# Patient Record
Sex: Female | Born: 1970 | Hispanic: No | Marital: Married | Smoking: Never smoker
Health system: Southern US, Community
[De-identification: ages and names within clinical notes are randomized; demographics above are authoritative.]

## PROBLEM LIST (undated history)

## (undated) DIAGNOSIS — I10 Essential (primary) hypertension: Secondary | ICD-10-CM

---

## 2020-07-16 ENCOUNTER — Encounter (HOSPITAL_COMMUNITY)
Admission: EM | Disposition: A | Discharge status: Rehabilitation Facility | Payer: Self-pay | Source: Home / Self Care | Attending: Neurology

## 2020-07-16 ENCOUNTER — Inpatient Hospital Stay (HOSPITAL_COMMUNITY): Payer: BC Managed Care – PPO | Admitting: Anesthesiology

## 2020-07-16 ENCOUNTER — Inpatient Hospital Stay (HOSPITAL_COMMUNITY): Payer: BC Managed Care – PPO

## 2020-07-16 ENCOUNTER — Emergency Department (HOSPITAL_COMMUNITY): Payer: BC Managed Care – PPO

## 2020-07-16 ENCOUNTER — Inpatient Hospital Stay (HOSPITAL_COMMUNITY)
Admission: EM | Admit: 2020-07-16 | Discharge: 2020-07-20 | DRG: 064 | Disposition: A | Discharge status: Rehabilitation Facility | Payer: BC Managed Care – PPO | Attending: Neurology | Admitting: Neurology

## 2020-07-16 DIAGNOSIS — Q211 Atrial septal defect: Secondary | ICD-10-CM | POA: Diagnosis not present

## 2020-07-16 DIAGNOSIS — J9690 Respiratory failure, unspecified, unspecified whether with hypoxia or hypercapnia: Secondary | ICD-10-CM | POA: Diagnosis not present

## 2020-07-16 DIAGNOSIS — I63512 Cerebral infarction due to unspecified occlusion or stenosis of left middle cerebral artery: Secondary | ICD-10-CM | POA: Diagnosis not present

## 2020-07-16 DIAGNOSIS — E785 Hyperlipidemia, unspecified: Secondary | ICD-10-CM | POA: Diagnosis present

## 2020-07-16 DIAGNOSIS — J9601 Acute respiratory failure with hypoxia: Secondary | ICD-10-CM | POA: Diagnosis not present

## 2020-07-16 DIAGNOSIS — I10 Essential (primary) hypertension: Secondary | ICD-10-CM | POA: Diagnosis present

## 2020-07-16 DIAGNOSIS — R29818 Other symptoms and signs involving the nervous system: Secondary | ICD-10-CM | POA: Diagnosis present

## 2020-07-16 DIAGNOSIS — Z20822 Contact with and (suspected) exposure to covid-19: Secondary | ICD-10-CM | POA: Diagnosis present

## 2020-07-16 DIAGNOSIS — E781 Pure hyperglyceridemia: Secondary | ICD-10-CM | POA: Diagnosis present

## 2020-07-16 DIAGNOSIS — I63522 Cerebral infarction due to unspecified occlusion or stenosis of left anterior cerebral artery: Secondary | ICD-10-CM | POA: Diagnosis present

## 2020-07-16 DIAGNOSIS — R4701 Aphasia: Secondary | ICD-10-CM | POA: Diagnosis present

## 2020-07-16 DIAGNOSIS — R339 Retention of urine, unspecified: Secondary | ICD-10-CM | POA: Diagnosis not present

## 2020-07-16 DIAGNOSIS — I63422 Cerebral infarction due to embolism of left anterior cerebral artery: Secondary | ICD-10-CM | POA: Diagnosis not present

## 2020-07-16 DIAGNOSIS — R29711 NIHSS score 11: Secondary | ICD-10-CM | POA: Diagnosis present

## 2020-07-16 DIAGNOSIS — I119 Hypertensive heart disease without heart failure: Secondary | ICD-10-CM | POA: Diagnosis present

## 2020-07-16 DIAGNOSIS — E872 Acidosis: Secondary | ICD-10-CM | POA: Diagnosis present

## 2020-07-16 DIAGNOSIS — E876 Hypokalemia: Secondary | ICD-10-CM | POA: Diagnosis not present

## 2020-07-16 DIAGNOSIS — I161 Hypertensive emergency: Secondary | ICD-10-CM | POA: Diagnosis present

## 2020-07-16 DIAGNOSIS — E7800 Pure hypercholesterolemia, unspecified: Secondary | ICD-10-CM | POA: Diagnosis present

## 2020-07-16 DIAGNOSIS — I609 Nontraumatic subarachnoid hemorrhage, unspecified: Secondary | ICD-10-CM

## 2020-07-16 DIAGNOSIS — E669 Obesity, unspecified: Secondary | ICD-10-CM | POA: Diagnosis present

## 2020-07-16 DIAGNOSIS — Z713 Dietary counseling and surveillance: Secondary | ICD-10-CM

## 2020-07-16 DIAGNOSIS — G8191 Hemiplegia, unspecified affecting right dominant side: Secondary | ICD-10-CM | POA: Diagnosis present

## 2020-07-16 DIAGNOSIS — R338 Other retention of urine: Secondary | ICD-10-CM | POA: Diagnosis not present

## 2020-07-16 DIAGNOSIS — Z6837 Body mass index (BMI) 37.0-37.9, adult: Secondary | ICD-10-CM | POA: Diagnosis not present

## 2020-07-16 DIAGNOSIS — R739 Hyperglycemia, unspecified: Secondary | ICD-10-CM

## 2020-07-16 DIAGNOSIS — I602 Nontraumatic subarachnoid hemorrhage from anterior communicating artery: Secondary | ICD-10-CM | POA: Diagnosis present

## 2020-07-16 DIAGNOSIS — R29810 Facial weakness: Secondary | ICD-10-CM | POA: Diagnosis present

## 2020-07-16 DIAGNOSIS — I639 Cerebral infarction, unspecified: Secondary | ICD-10-CM

## 2020-07-16 DIAGNOSIS — I670 Dissection of cerebral arteries, nonruptured: Secondary | ICD-10-CM | POA: Diagnosis present

## 2020-07-16 DIAGNOSIS — G2581 Restless legs syndrome: Secondary | ICD-10-CM | POA: Diagnosis present

## 2020-07-16 DIAGNOSIS — I350 Nonrheumatic aortic (valve) stenosis: Secondary | ICD-10-CM | POA: Diagnosis not present

## 2020-07-16 DIAGNOSIS — T17908A Unspecified foreign body in respiratory tract, part unspecified causing other injury, initial encounter: Secondary | ICD-10-CM

## 2020-07-16 DIAGNOSIS — K219 Gastro-esophageal reflux disease without esophagitis: Secondary | ICD-10-CM | POA: Diagnosis present

## 2020-07-16 DIAGNOSIS — Q2112 Patent foramen ovale: Secondary | ICD-10-CM

## 2020-07-16 DIAGNOSIS — E782 Mixed hyperlipidemia: Secondary | ICD-10-CM | POA: Diagnosis not present

## 2020-07-16 HISTORY — PX: RADIOLOGY WITH ANESTHESIA: SHX6223

## 2020-07-16 HISTORY — DX: Essential (primary) hypertension: I10

## 2020-07-16 LAB — CBC
HCT: 37.9 % (ref 36.0–46.0)
Hemoglobin: 12.1 g/dL (ref 12.0–15.0)
MCH: 25.4 pg — ABNORMAL LOW (ref 26.0–34.0)
MCHC: 31.9 g/dL (ref 30.0–36.0)
MCV: 79.6 fL — ABNORMAL LOW (ref 80.0–100.0)
Platelets: 407 10*3/uL — ABNORMAL HIGH (ref 150–400)
RBC: 4.76 MIL/uL (ref 3.87–5.11)
RDW: 19.8 % — ABNORMAL HIGH (ref 11.5–15.5)
WBC: 9.5 10*3/uL (ref 4.0–10.5)
nRBC: 0 % (ref 0.0–0.2)

## 2020-07-16 LAB — DIFFERENTIAL
Abs Immature Granulocytes: 0.02 10*3/uL (ref 0.00–0.07)
Basophils Absolute: 0.1 10*3/uL (ref 0.0–0.1)
Basophils Relative: 1 %
Eosinophils Absolute: 0.4 10*3/uL (ref 0.0–0.5)
Eosinophils Relative: 5 %
Immature Granulocytes: 0 %
Lymphocytes Relative: 40 %
Lymphs Abs: 3.8 10*3/uL (ref 0.7–4.0)
Monocytes Absolute: 0.7 10*3/uL (ref 0.1–1.0)
Monocytes Relative: 8 %
Neutro Abs: 4.4 10*3/uL (ref 1.7–7.7)
Neutrophils Relative %: 46 %

## 2020-07-16 LAB — I-STAT CHEM 8, ED
BUN: 12 mg/dL (ref 6–20)
Calcium, Ion: 1.11 mmol/L — ABNORMAL LOW (ref 1.15–1.40)
Chloride: 104 mmol/L (ref 98–111)
Creatinine, Ser: 0.8 mg/dL (ref 0.44–1.00)
Glucose, Bld: 99 mg/dL (ref 70–99)
HCT: 40 % (ref 36.0–46.0)
Hemoglobin: 13.6 g/dL (ref 12.0–15.0)
Potassium: 3.5 mmol/L (ref 3.5–5.1)
Sodium: 139 mmol/L (ref 135–145)
TCO2: 23 mmol/L (ref 22–32)

## 2020-07-16 LAB — CBG MONITORING, ED: Glucose-Capillary: 97 mg/dL (ref 70–99)

## 2020-07-16 LAB — I-STAT BETA HCG BLOOD, ED (MC, WL, AP ONLY): I-stat hCG, quantitative: <5 m[IU]/mL (ref <5)

## 2020-07-16 LAB — PROTIME-INR
INR: 0.9 (ref 0.8–1.2)
Prothrombin Time: 11.3 seconds — ABNORMAL LOW (ref 11.4–15.2)

## 2020-07-16 LAB — APTT: aPTT: 30 seconds (ref 24–36)

## 2020-07-16 LAB — SARS CORONAVIRUS 2 BY RT PCR (HOSPITAL ORDER, PERFORMED IN ~~LOC~~ HOSPITAL LAB): SARS Coronavirus 2: NEGATIVE

## 2020-07-16 SURGERY — IR WITH ANESTHESIA
Anesthesia: General

## 2020-07-16 MED ORDER — SODIUM CHLORIDE 0.9 % IV SOLN: INTRAVENOUS | Status: DC | PRN

## 2020-07-16 MED ORDER — FENTANYL CITRATE (PF) 100 MCG/2ML IJ SOLN
INTRAMUSCULAR | Status: DC | PRN
  Administered 2020-07-16 – 2020-07-17 (×2): 50 ug via INTRAVENOUS

## 2020-07-16 MED ORDER — CLEVIDIPINE BUTYRATE 0.5 MG/ML IV EMUL
INTRAVENOUS | Status: DC | PRN
  Administered 2020-07-16: 8 mg/h via INTRAVENOUS

## 2020-07-16 MED ORDER — CLEVIDIPINE BUTYRATE 0.5 MG/ML IV EMUL
INTRAVENOUS | Status: AC
  Administered 2020-07-16: 1 mg/h
  Filled 2020-07-16: qty 50

## 2020-07-16 MED ORDER — VERAPAMIL HCL 2.5 MG/ML IV SOLN
INTRAVENOUS | Status: AC
  Filled 2020-07-16: qty 2

## 2020-07-16 MED ORDER — SENNOSIDES-DOCUSATE SODIUM 8.6-50 MG PO TABS
1.0000 | ORAL_TABLET | Freq: Once | ORAL | Status: DC
  Filled 2020-07-16: qty 1

## 2020-07-16 MED ORDER — CLOPIDOGREL BISULFATE 300 MG PO TABS
ORAL_TABLET | ORAL | Status: AC
  Filled 2020-07-16: qty 1

## 2020-07-16 MED ORDER — SUCCINYLCHOLINE CHLORIDE 20 MG/ML IJ SOLN
INTRAMUSCULAR | Status: DC | PRN
  Administered 2020-07-16: 100 mg via INTRAVENOUS

## 2020-07-16 MED ORDER — SODIUM CHLORIDE 0.9% FLUSH
3.0000 mL | Freq: Once | INTRAVENOUS | Status: DC
Start: 2020-07-16 — End: 2020-07-20

## 2020-07-16 MED ORDER — ROCURONIUM BROMIDE 10 MG/ML (PF) SYRINGE
PREFILLED_SYRINGE | INTRAVENOUS | Status: DC | PRN
  Administered 2020-07-16: 30 mg via INTRAVENOUS
  Administered 2020-07-16: 20 mg via INTRAVENOUS

## 2020-07-16 MED ORDER — FENTANYL CITRATE (PF) 100 MCG/2ML IJ SOLN
INTRAMUSCULAR | Status: AC
  Filled 2020-07-16: qty 2

## 2020-07-16 MED ORDER — SODIUM CHLORIDE 0.9 % IV SOLN: INTRAVENOUS | Status: DC

## 2020-07-16 MED ORDER — STROKE: EARLY STAGES OF RECOVERY BOOK
Freq: Once | Status: DC
  Filled 2020-07-16: qty 1

## 2020-07-16 MED ORDER — ACETAMINOPHEN 650 MG RE SUPP
650.0000 mg | RECTAL | Status: DC | PRN
  Administered 2020-07-17: 650 mg via RECTAL
  Filled 2020-07-16: qty 1

## 2020-07-16 MED ORDER — ASPIRIN 81 MG PO CHEW
CHEWABLE_TABLET | ORAL | Status: AC
  Filled 2020-07-16: qty 1

## 2020-07-16 MED ORDER — CEFAZOLIN SODIUM-DEXTROSE 2-4 GM/100ML-% IV SOLN
INTRAVENOUS | Status: AC
  Filled 2020-07-16: qty 100

## 2020-07-16 MED ORDER — LIDOCAINE 2% (20 MG/ML) 5 ML SYRINGE
INTRAMUSCULAR | Status: DC | PRN
  Administered 2020-07-16: 60 mg via INTRAVENOUS

## 2020-07-16 MED ORDER — TIROFIBAN HCL IN NACL 5-0.9 MG/100ML-% IV SOLN
INTRAVENOUS | Status: AC
  Filled 2020-07-16: qty 100

## 2020-07-16 MED ORDER — ASPIRIN 300 MG RE SUPP
300.0000 mg | Freq: Every day | RECTAL | Status: DC
  Administered 2020-07-17: 300 mg via RECTAL
  Filled 2020-07-16: qty 1

## 2020-07-16 MED ORDER — PROPOFOL 10 MG/ML IV BOLUS
INTRAVENOUS | Status: DC | PRN
  Administered 2020-07-16: 200 mg via INTRAVENOUS

## 2020-07-16 MED ORDER — ACETAMINOPHEN 160 MG/5ML PO SOLN: 650.0000 mg | ORAL | Status: DC | PRN

## 2020-07-16 MED ORDER — ACETAMINOPHEN 325 MG PO TABS: 650.0000 mg | ORAL_TABLET | ORAL | Status: DC | PRN

## 2020-07-16 MED ORDER — IOHEXOL 350 MG/ML SOLN
75.0000 mL | Freq: Once | INTRAVENOUS | Status: AC | PRN
  Administered 2020-07-16: 75 mL via INTRAVENOUS

## 2020-07-16 MED ORDER — TICAGRELOR 90 MG PO TABS
ORAL_TABLET | ORAL | Status: AC
  Filled 2020-07-16: qty 2

## 2020-07-16 MED ORDER — EPTIFIBATIDE 20 MG/10ML IV SOLN
INTRAVENOUS | Status: AC
  Filled 2020-07-16: qty 10

## 2020-07-16 MED ORDER — ASPIRIN 325 MG PO TABS
325.0000 mg | ORAL_TABLET | Freq: Every day | ORAL | Status: DC
  Administered 2020-07-18: 325 mg via ORAL
  Filled 2020-07-16 (×2): qty 1

## 2020-07-16 MED ORDER — IOHEXOL 240 MG/ML SOLN
INTRAMUSCULAR | Status: AC
  Filled 2020-07-16: qty 200

## 2020-07-16 MED ORDER — NITROGLYCERIN 1 MG/10 ML FOR IR/CATH LAB
INTRA_ARTERIAL | Status: AC
  Filled 2020-07-16: qty 10

## 2020-07-16 NOTE — Code Documentation (Signed)
Responded to Code Stroke called at 2143 for R sided numbness/weakness, aphasia, and R facial droop, [id number]. Pt arrived at 2200, NIH-11, CBG-99, CT head-focal [facility]. CTA-L A2 segment occlusion, ?L frontal cortical vein thrombosis. Pt started on cleviprex gtt-titration orders for SBP<180.  Decision made to take pt to IR for cerebral arteriogram. IR paged out at 2253. Pt prepped and transported to IR [facility] at 2258

## 2020-07-16 NOTE — ED Notes (Signed)
Report given to Anastisia.  Change of monitoring equipment.  Belongings given to daughter.

## 2020-07-16 NOTE — Anesthesia Preprocedure Evaluation (Addendum)
Anesthesia Evaluation  Patient identified by MRN, date of birth, ID band  Reviewed: Allergy & Precautions, Patient's Chart, lab work & pertinent test results, Unable to perform ROS - Chart review onlyPreop documentation limited or incomplete due to emergent nature of procedure.  Airway Mallampati: II       Dental  (+) Teeth Intact   Pulmonary    Pulmonary exam normal        Cardiovascular Normal cardiovascular exam     Neuro/Psych CVA, Residual Symptoms    GI/Hepatic   Endo/Other    Renal/GU      Musculoskeletal   Abdominal   Peds  Hematology   Anesthesia Other Findings Code Stroke  Reproductive/Obstetrics                            Anesthesia Physical Anesthesia Plan  ASA: III and emergent  Anesthesia Plan: General   Post-op Pain Management:    Induction: Intravenous and Rapid sequence  PONV Risk Score and Plan: 3 and Ondansetron, Dexamethasone and Treatment may vary due to age or medical condition  Airway Management Planned: Oral ETT  Additional Equipment: Arterial line  Intra-op Plan:   Post-operative Plan: Extubation in OR  Informed Consent:     Only emergency history available, History available from chart only and Consent reviewed with POA  Plan Discussed with: CRNA  Anesthesia Plan Comments: (Anesthetic plan discussed with family)        Anesthesia Quick Evaluation

## 2020-07-16 NOTE — ED Notes (Signed)
2315 anesthesia ready - timeout completed 2316 induction began 2317 intubation  2320 in IR suite.

## 2020-07-16 NOTE — Anesthesia Procedure Notes (Signed)
Procedure Name: Intubation Date/Time: 07/16/2020 11:17 PM Performed by: Edmonia Caprio, CRNA Pre-anesthesia Checklist: Patient identified, Emergency Drugs available, Suction available, Timeout performed and Patient being monitored Patient Re-evaluated:Patient Re-evaluated prior to induction Oxygen Delivery Method: Ambu bag Preoxygenation: Pre-oxygenation with 100% oxygen Induction Type: IV induction and Rapid sequence Laryngoscope Size: Glidescope and 3 Grade View: Grade I Tube type: Oral Tube size: 7.0 mm Number of attempts: 1 Airway Equipment and Method: Video-laryngoscopy and Stylet Placement Confirmation: ETT inserted through vocal cords under direct vision,  positive ETCO2 and breath sounds checked- equal and bilateral Secured at: 22 cm Tube secured with: Tape Dental Injury: Teeth and Oropharynx as per pre-operative assessment  Comments: Glidescope intubation in IR intubation room due to unknown covid status

## 2020-07-16 NOTE — ED Notes (Signed)
Family bedside, has been updated continuously.  Daughter holding pt's hand.

## 2020-07-16 NOTE — H&P (Signed)
Neurology H&P  CC: Aphasia and leg weakness  History is obtained from: Daughter  HPI: Tanya Lopez is a 49 y.o. female with a history of hypertension who presents with weakness and aphasia that started abruptly at 21:07.  She was in the kitchen covering a dish, when she called to her daughter said that her right leg felt like it was going numb.  She subsequently developed trouble speaking.  A code stroke was activated en route and she was taken for an emergent head CT.  Unfortunately, she was not a TPA candidate due to a small cortical subarachnoid that appears acute.  A CTA was performed which demonstrates a left A2 occlusion.  Given that there is no other treatment option, this case was discussed with Dr. Corliss Skains of neuro interventional radiology.  There is a possibility that this vessel is large enough to be amenable to some type of mechanical revascularization technique and therefore after discussion with the husband(Tanya Lopez, local internist), the decision was made to proceed with attempting this.  Her daughter denies any recent head trauma.  LKW: 21:07 pm  tpa given?: No, SAH IR Thrombectomy? Yes Modified Rankin Scale: 0-Completely asymptomatic and back to baseline post- stroke  ROS: A complete ROS was performed and is negative except as noted in the HPI.   PMH:  Hypertension  FHx: Unable to assess secondary to patient's altered mental status.   Social History: Does not smoke   Prior to Admission medications   Not on File  Bystolic Amlodipine   Exam: Current vital signs: BP (!) 172/81   Pulse 99   Resp 22   Wt (!) 95.3 kg   SpO2 98%    Physical Exam  Constitutional: Appears well-developed and well-nourished.  Psych: Affect appropriate to situation Eyes: No scleral injection HENT: No OP obstrucion Head: Normocephalic.  Cardiovascular: Normal rate and regular rhythm.  Respiratory: Effort normal and breath sounds normal to anterior  ascultation GI: Soft.  No distension. There is no tenderness.  Skin: WDI  Neuro: Mental Status: Patient is awake, alert, she has a dense expressive aphasia.  She is able to follow commands, some readily, but has more difficulty with more complex commands. Cranial Nerves: II: She blinks to threat bilaterally pupils are equal, round, and reactive to light.   III,IV, VI: EOMI without ptosis or diploplia.  V: Facial sensation is symmetric to temperature VII: Facial movement is symmetric.  VIII: hearing is intact to voice X: Uvula elevates symmetrically XI: Shoulder shrug is symmetric. XII: tongue is midline without atrophy or fasciculations.  Motor: Tone is normal. Bulk is normal. 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   Secondary Diagnosis: Essential (primary) hypertension Subarachnoid hemorrhage ?  Cortical vein thrombosis  Impression: 49 year old female with left A2 occlusion.  She also has a tiny cortical subarachnoid hemorrhage of unclear etiology, but there is question of a cortical vein thrombosis on CTA though I am not certain this is definite.  After discussion with neuro interventional radiology, the  decision to proceed with cerebral arteriogram and mechanical thrombectomy was pursued.   The angiogram will also likely be able to shed some light on whether there is truly a cortical vein thrombosis responsible for her tiny cortical  subarachnoid.  Plan: - HgbA1c, fasting lipid panel - MRI of the brain without contrast - Frequent neuro checks - Echocardiogram - Carotid dopplers - Prophylactic therapy-pending outcome of IR procedure - Risk factor modification - Telemetry monitoring - PT consult, OT consult, Speech consult - Stroke team to follow  This patient is critically ill and at significant risk of neurological worsening, death and care requires constant monitoring of vital signs, hemodynamics,respiratory and cardiac monitoring, neurological assessment, discussion with family, other specialists and medical decision making of high complexity. I spent 60 minutes of neurocritical care time  in the care of  this patient. This was time spent independent of any time provided by nurse practitioner or PA.  Ritta Slot, MD [facility] [phone]  If 7pm- 7am, please page neurology on call as listed in AMION. 07/16/2020  11:55 PM

## 2020-07-16 NOTE — ED Provider Notes (Signed)
[facility] EMERGENCY DEPARTMENT Provider Note   CSN: [id number] Arrival date & time: 07/16/20  2200  An emergency department physician performed an initial assessment on this suspected stroke patient at 2200.  History Chief Complaint  Patient presents with  . Code Stroke    Tanya Lopez is a 49 y.o. female.  Pt presents to the ED today with an acute onset of right sided weakness and inability to speak.  She was LSN 2107.  She was speaking on the phone when this occurred.  This was witnessed by her husband.  The pt is unable to give any hx.        No past medical history on file.  Patient Active Problem List   Diagnosis Date Noted  . 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     No family history on file.  Social History   Tobacco Use  . Smoking status: Not on file  Substance Use Topics  . Alcohol use: Not on file  . Drug use: Not on file    Home Medications Prior to Admission medications   Not on File    Allergies    Patient has no known allergies.  Review of Systems   Review of Systems  Neurological: Positive for weakness.    Physical Exam Updated Vital Signs BP (!) 172/81   Pulse 99   Resp 22   Wt (!) 95.3 kg   SpO2 98%   Physical Exam Vitals and nursing note reviewed.  Constitutional:      Appearance: Normal appearance.  HENT:     Head: Normocephalic and atraumatic.     Right Ear: External ear normal.     Left Ear: External ear normal.     Nose: Nose normal.     Mouth/Throat:     Mouth: Mucous membranes are moist.     Pharynx: Oropharynx is clear.  Eyes:     Extraocular Movements: Extraocular movements intact.     Conjunctiva/sclera: Conjunctivae normal.     Pupils: Pupils are equal, round, and reactive to light.  Cardiovascular:     Rate and Rhythm: Normal rate and regular rhythm.     Pulses: Normal pulses.     Heart sounds: Normal heart sounds.    Pulmonary:     Effort: Pulmonary effort is normal.     Breath sounds: Normal breath sounds.  Abdominal:     General: Abdomen is flat. Bowel sounds are normal.     Palpations: Abdomen is soft.  Musculoskeletal:        General: Normal range of motion.     Cervical back: Normal range of motion and neck supple.  Skin:    General: Skin is warm.     Capillary Refill: Capillary refill takes less than 2 seconds.  Neurological:     Mental Status: She is alert.     Comments: Right arm and leg weakness.  Aphasia.  Psychiatric:        Mood and Affect: Mood normal.        Behavior: Behavior normal.        Thought Content: Thought content normal.        Judgment: Judgment normal.     ED Results / Procedures / Treatments   Labs (all labs ordered are listed, but only abnormal results are displayed) Labs Reviewed  PROTIME-INR - Abnormal; Notable for the following components:      Result  Value   Prothrombin Time 11.3 (*)    All other components within normal limits  CBC - Abnormal; Notable for the following components:   MCV 79.6 (*)    MCH 25.4 (*)    RDW 19.8 (*)    Platelets 407 (*)    All other components within normal limits  I-STAT CHEM 8, ED - Abnormal; Notable for the following components:   Calcium, Ion 1.11 (*)    All other components within normal limits  SARS CORONAVIRUS 2 BY RT PCR (HOSPITAL ORDER, PERFORMED IN [facility])  APTT  DIFFERENTIAL  COMPREHENSIVE METABOLIC PANEL  HIV ANTIBODY (ROUTINE TESTING W REFLEX)  HEMOGLOBIN A1C  LIPID PANEL  CBG MONITORING, ED  I-STAT BETA HCG BLOOD, ED ([facility], [facility], [facility] ONLY)    EKG None  Radiology CT Code Stroke CTA Head W/WO contrast  Result Date: 07/16/2020 CLINICAL DATA:  Right-sided facial droop. Right-sided numbness. Posteromedial frontal lobe subarachnoid hemorrhage. EXAM: CT ANGIOGRAPHY HEAD AND NECK TECHNIQUE: Multidetector CT imaging of the head and neck was performed using the standard protocol during bolus  administration of intravenous contrast. Multiplanar CT image reconstructions and MIPs were obtained to evaluate the vascular anatomy. Carotid stenosis measurements (when applicable) are obtained utilizing NASCET criteria, using the distal internal carotid diameter as the denominator. CONTRAST:  16mL OMNIPAQUE IOHEXOL 350 MG/ML SOLN COMPARISON:  CT head without contrast 07/16/2020 FINDINGS: CTA NECK FINDINGS Aortic arch: A 3 vessel arch configuration is present. No significant atherosclerotic disease is present. No stenosis or aneurysm is present. Right carotid system: The right common carotid artery is mildly tortuous. No significant stenosis is present. The bifurcation is unremarkable. Mild tortuosity is present in the proximal cervical right ICA without significant stenosis. Left carotid system: The left common carotid artery is within normal limits. The bifurcation is unremarkable. Mild tortuosity is present in the cervical left ICA without significant stenosis. Vertebral arteries: The left vertebral artery is the dominant vessel. Both vertebral arteries originate from the subclavian arteries without significant stenosis. No significant stenosis are present in either vertebral artery in the neck. Skeleton: Vertebral body heights and alignment are normal. Straightening of the normal cervical lordosis is noted. No focal lytic or blastic lesions are present. Other neck: The soft tissues the neck are otherwise unremarkable. Salivary glands are within normal limits. Thyroid is normal. No significant adenopathy is present. Upper chest: The lung apices demonstrate mild dependent atelectasis. No focal nodule, mass, or airspace disease is present. The thoracic inlet is within normal limits. Review of the MIP images confirms the above findings CTA HEAD FINDINGS Anterior circulation: The internal carotid arteries are within normal limits through the ICA termini bilaterally. The A1 and M1 segments are normal. Left A2 segment  occlusion is present with no reconstitution of distal branch vessels on the left. Right ACA branch vessels are within normal limits. MCA branch vessels are normal bilaterally. Posterior circulation: The left vertebral artery is the dominant vessel. PICA origins are visualized and normal. The vertebrobasilar junction is normal. Mild narrowing is present in the distal basilar artery. Fetal type right posterior cerebral artery is present. Both posterior cerebral arteries originate from the basilar tip. Prominent posterior communicating arteries are present bilaterally. PCA branch vessels are within normal limits. Venous sinuses: The dural sinuses are patent. The straight sinus deep cerebral veins are intact. Cortical vein near the area subarachnoid hemorrhage appears incomplete to the sagittal sinus. Cortical veins are otherwise within normal limits. Anatomic variants: Prominent posterior communicating arteries bilaterally.  Review of the MIP images confirms the above findings IMPRESSION: 1. 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 2. Incomplete filling of posterior left frontal cortical vein could represent cortical vein thrombosis. Cerebral arteriogram would be useful for further evaluation of both possibilities. 3. Mild tortuosity of cervical vessels without significant cervical stenosis. This is most commonly seen in the setting of chronic hypertension. These results were called by telephone at the time of interpretation on 07/16/2020 at 10:42 pm to provider [facility] , who verbally acknowledged these results. Electronically Signed   By: Marin Robertshristopher  Mattern M.D.   On: 07/16/2020 22:43   CT Code Stroke CTA Neck W/WO contrast  Result Date: 07/16/2020 CLINICAL DATA:  Right-sided facial droop. Right-sided numbness. Posteromedial frontal lobe subarachnoid hemorrhage. EXAM: CT ANGIOGRAPHY HEAD AND NECK TECHNIQUE: Multidetector CT imaging of the head and neck was performed  using the standard protocol during bolus administration of intravenous contrast. Multiplanar CT image reconstructions and MIPs were obtained to evaluate the vascular anatomy. Carotid stenosis measurements (when applicable) are obtained utilizing NASCET criteria, using the distal internal carotid diameter as the denominator. CONTRAST:  75mL OMNIPAQUE IOHEXOL 350 MG/ML SOLN COMPARISON:  CT head without contrast 07/16/2020 FINDINGS: CTA NECK FINDINGS Aortic arch: A 3 vessel arch configuration is present. No significant atherosclerotic disease is present. No stenosis or aneurysm is present. Right carotid system: The right common carotid artery is mildly tortuous. No significant stenosis is present. The bifurcation is unremarkable. Mild tortuosity is present in the proximal cervical right ICA without significant stenosis. Left carotid system: The left common carotid artery is within normal limits. The bifurcation is unremarkable. Mild tortuosity is present in the cervical left ICA without significant stenosis. Vertebral arteries: The left vertebral artery is the dominant vessel. Both vertebral arteries originate from the subclavian arteries without significant stenosis. No significant stenosis are present in either vertebral artery in the neck. Skeleton: Vertebral body heights and alignment are normal. Straightening of the normal cervical lordosis is noted. No focal lytic or blastic lesions are present. Other neck: The soft tissues the neck are otherwise unremarkable. Salivary glands are within normal limits. Thyroid is normal. No significant adenopathy is present. Upper chest: The lung apices demonstrate mild dependent atelectasis. No focal nodule, mass, or airspace disease is present. The thoracic inlet is within normal limits. Review of the MIP images confirms the above findings CTA HEAD FINDINGS Anterior circulation: The internal carotid arteries are within normal limits through the ICA termini bilaterally. The A1  and M1 segments are normal. Left A2 segment occlusion is present with no reconstitution of distal branch vessels on the left. Right ACA branch vessels are within normal limits. MCA branch vessels are normal bilaterally. Posterior circulation: The left vertebral artery is the dominant vessel. PICA origins are visualized and normal. The vertebrobasilar junction is normal. Mild narrowing is present in the distal basilar artery. Fetal type right posterior cerebral artery is present. Both posterior cerebral arteries originate from the basilar tip. Prominent posterior communicating arteries are present bilaterally. PCA branch vessels are within normal limits. Venous sinuses: The dural sinuses are patent. The straight sinus deep cerebral veins are intact. Cortical vein near the area subarachnoid hemorrhage appears incomplete to the sagittal sinus. Cortical veins are otherwise within normal limits. Anatomic variants: Prominent posterior communicating arteries bilaterally. Review of the MIP images confirms the above findings IMPRESSION: 1. 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 2. Incomplete filling of posterior left frontal cortical vein  could represent cortical vein thrombosis. Cerebral arteriogram would be useful for further evaluation of both possibilities. 3. Mild tortuosity of cervical vessels without significant cervical stenosis. This is most commonly seen in the setting of chronic hypertension. These results were called by telephone at the time of interpretation on 07/16/2020 at 10:42 pm to provider MCNEILL [facility] , who verbally acknowledged these results. Electronically Signed   By: Marin Roberts M.D.   On: 07/16/2020 22:43   CT HEAD CODE STROKE WO CONTRAST  Result Date: 07/16/2020 CLINICAL DATA:  Code stroke. Right-sided facial droop. Right-sided numbness. EXAM: CT HEAD WITHOUT CONTRAST TECHNIQUE: Contiguous axial images were obtained from the base of the skull  through the vertex without intravenous contrast. COMPARISON:  None. FINDINGS: Brain: Focal subarachnoid hemorrhage is present in the medial posterior left frontal lobe. No other areas of hemorrhage are present. No focal cortical abnormality is present. Cortex adjacent to the subarachnoid hemorrhage is obscured. Insular ribbon is normal. Basal ganglia are normal. The brainstem and cerebellum are within normal limits. Vascular: No hyperdense vessel or unexpected calcification. Skull: Calvarium is intact. No focal lytic or blastic lesions are present. No significant extracranial soft tissue lesion is present. Sinuses/Orbits: A polyp or mucous retention cyst is present along the floor of the left maxillary sinus posteriorly. The paranasal sinuses and mastoid air cells are otherwise clear. The globes and orbits are within normal limits. ASPECTS [facility] Stroke Program Early CT Score) - Ganglionic level infarction (caudate, lentiform nuclei, internal capsule, insula, M1-M3 cortex): 7/7 - Supraganglionic infarction (M4-M6 cortex): 3/3 Total score (0-10 with 10 being normal): 10/10 IMPRESSION: 1. Focal subarachnoid hemorrhage in the medial posterior left frontal lobe. Question venous hemorrhage. Vascular malformation considered less likely. Alternatively, could represent hemorrhagic conversion of an occult infarct. 2. Otherwise normal CT appearance of the brain. 3. ASPECTS is 10/10 The above was relayed via text pager to Dr. Amada Jupiter on 07/16/2020 at 22:12 . Electronically Signed   By: Marin Roberts M.D.   On: 07/16/2020 22:17    Procedures Procedures (including critical care time)  Medications Ordered in ED Medications  sodium chloride flush (NS) 0.9 % injection 3 mL (has no administration in time range)   stroke: mapping our early stages of recovery book (has no administration in time range)  0.9 %  sodium chloride infusion (has no administration in time range)  acetaminophen (TYLENOL) tablet 650 mg  (has no administration in time range)    Or  acetaminophen (TYLENOL) 160 MG/5ML solution 650 mg (has no administration in time range)    Or  acetaminophen (TYLENOL) suppository 650 mg (has no administration in time range)  senna-docusate (Senokot-S) tablet 1 tablet (has no administration in time range)  aspirin suppository 300 mg (has no administration in time range)    Or  aspirin tablet 325 mg (has no administration in time range)  iohexol (OMNIPAQUE) 240 MG/ML injection (has no administration in time range)  aspirin 81 MG chewable tablet (has no administration in time range)  verapamil (ISOPTIN) 2.5 MG/ML injection (has no administration in time range)  ticagrelor (BRILINTA) 90 MG tablet (has no administration in time range)  clopidogrel (PLAVIX) 300 MG tablet (has no administration in time range)  nitroGLYCERIN 100 mcg/mL intra-arterial injection (has no administration in time range)  tirofiban (AGGRASTAT) 5-0.9 MG/100ML-% injection (has no administration in time range)  eptifibatide (INTEGRILIN) 20 MG/10ML injection (has no administration in time range)  ceFAZolin (ANCEF) 2-4 GM/100ML-% IVPB (has no administration in time range)  fentaNYL (SUBLIMAZE)  100 MCG/2ML injection (has no administration in time range)  iohexol (OMNIPAQUE) 350 MG/ML injection 75 mL (75 mLs Intravenous Contrast Given 07/16/20 2220)  clevidipine (CLEVIPREX) 0.5 MG/ML infusion (8 mg/hr  Rate/Dose Change 07/16/20 2302)    ED Course  I have reviewed the triage vital signs and the nursing notes.  Pertinent labs & imaging results that were available during my care of the patient were reviewed by me and considered in my medical decision making (see chart for details).    MDM Rules/Calculators/A&P                         Pt taken straight to CT.  She has a SAH, so is unable to get tpa.  CT showed an A2 segment occlusion.  Pt taken directly to IR.   CRITICAL CARE Performed by: Jacalyn Lefevre   Total critical  care time: 30 minutes  Critical care time was exclusive of separately billable procedures and treating other patients.  Critical care was necessary to treat or prevent imminent or life-threatening deterioration.  Critical care was time spent personally by me on the following activities: development of treatment plan with patient and/or surrogate as well as nursing, discussions with consultants, evaluation of patient's response to treatment, examination of patient, obtaining history from patient or surrogate, ordering and performing treatments and interventions, ordering and review of laboratory studies, ordering and review of radiographic studies, pulse oximetry and re-evaluation of patient's condition. Final Clinical Impression(s) / ED Diagnoses Final diagnoses:  Stroke [facility])    Rx / DC Orders ED Discharge Orders    None       Jacalyn Lefevre, MD 07/16/20 2338

## 2020-07-16 NOTE — ED Notes (Signed)
Just arrived to IR w/ PT and family.

## 2020-07-16 NOTE — ED Triage Notes (Signed)
Per EMS pt from home, at began having right sided leg numbness, fell to the ground and could not get up.  No resistance on right side leg or arm, unable to speak.

## 2020-07-17 ENCOUNTER — Inpatient Hospital Stay (HOSPITAL_COMMUNITY): Payer: BC Managed Care – PPO

## 2020-07-17 DIAGNOSIS — I63522 Cerebral infarction due to unspecified occlusion or stenosis of left anterior cerebral artery: Secondary | ICD-10-CM | POA: Diagnosis present

## 2020-07-17 DIAGNOSIS — E7800 Pure hypercholesterolemia, unspecified: Secondary | ICD-10-CM

## 2020-07-17 DIAGNOSIS — I161 Hypertensive emergency: Secondary | ICD-10-CM

## 2020-07-17 DIAGNOSIS — I350 Nonrheumatic aortic (valve) stenosis: Secondary | ICD-10-CM

## 2020-07-17 DIAGNOSIS — J9601 Acute respiratory failure with hypoxia: Secondary | ICD-10-CM

## 2020-07-17 DIAGNOSIS — R29818 Other symptoms and signs involving the nervous system: Secondary | ICD-10-CM | POA: Diagnosis present

## 2020-07-17 HISTORY — PX: IR ANGIO VERTEBRAL SEL VERTEBRAL BILAT MOD SED: IMG5369

## 2020-07-17 HISTORY — PX: IR ANGIO INTRA EXTRACRAN SEL COM CAROTID INNOMINATE BILAT MOD SED: IMG5360

## 2020-07-17 HISTORY — PX: IR CT HEAD LTD: IMG2386

## 2020-07-17 LAB — LIPID PANEL
Cholesterol: 302 mg/dL — ABNORMAL HIGH (ref 0–200)
Triglycerides: 1600 mg/dL — ABNORMAL HIGH (ref <150)
VLDL: UNDETERMINED mg/dL (ref 0–40)

## 2020-07-17 LAB — COMPREHENSIVE METABOLIC PANEL
ALT: 22 U/L (ref 0–44)
AST: 34 U/L (ref 15–41)
Albumin: 3.7 g/dL (ref 3.5–5.0)
Alkaline Phosphatase: 66 U/L (ref 38–126)
Anion gap: 18 — ABNORMAL HIGH (ref 5–15)
BUN: 11 mg/dL (ref 6–20)
CO2: 16 mmol/L — ABNORMAL LOW (ref 22–32)
Calcium: 9.1 mg/dL (ref 8.9–10.3)
Chloride: 103 mmol/L (ref 98–111)
Creatinine, Ser: 0.83 mg/dL (ref 0.44–1.00)
GFR calc Af Amer: >60 mL/min (ref >60)
GFR calc non Af Amer: >60 mL/min (ref >60)
Glucose, Bld: 97 mg/dL (ref 70–99)
Potassium: 3.9 mmol/L (ref 3.5–5.1)
Sodium: 137 mmol/L (ref 135–145)
Total Bilirubin: 0.8 mg/dL (ref 0.3–1.2)
Total Protein: 7.7 g/dL (ref 6.5–8.1)

## 2020-07-17 LAB — ECHOCARDIOGRAM COMPLETE
AR max vel: 1.96 cm2
AV Area VTI: 1.85 cm2
AV Area mean vel: 2.01 cm2
AV Mean grad: 11 mmHg
AV Peak grad: 18.5 mmHg
Ao pk vel: 2.15 m/s
Area-P 1/2: 2.99 cm2
Height: 63 in
S' Lateral: 2.1 cm
Weight: 3361.57 oz

## 2020-07-17 LAB — POCT I-STAT 7, (LYTES, BLD GAS, ICA,H+H)
Acid-base deficit: 3 mmol/L — ABNORMAL HIGH (ref 0.0–2.0)
Bicarbonate: 21.2 mmol/L (ref 20.0–28.0)
Calcium, Ion: 1.11 mmol/L — ABNORMAL LOW (ref 1.15–1.40)
HCT: 38 % (ref 36.0–46.0)
Hemoglobin: 12.9 g/dL (ref 12.0–15.0)
O2 Saturation: 97 %
Patient temperature: 98.2
Potassium: 3.6 mmol/L (ref 3.5–5.1)
Sodium: 136 mmol/L (ref 135–145)
TCO2: 22 mmol/L (ref 22–32)
pCO2 arterial: 35.4 mmHg (ref 32.0–48.0)
pH, Arterial: 7.384 (ref 7.350–7.450)
pO2, Arterial: 91 mmHg (ref 83.0–108.0)

## 2020-07-17 LAB — MRSA PCR SCREENING: MRSA by PCR: POSITIVE — AB

## 2020-07-17 LAB — URINALYSIS, ROUTINE W REFLEX MICROSCOPIC
Bilirubin Urine: NEGATIVE
Glucose, UA: NEGATIVE mg/dL
Ketones, ur: NEGATIVE mg/dL
Nitrite: NEGATIVE
Protein, ur: 30 mg/dL — AB
Specific Gravity, Urine: 1.032 — ABNORMAL HIGH (ref 1.005–1.030)
pH: 7 (ref 5.0–8.0)

## 2020-07-17 LAB — LACTIC ACID, PLASMA
Lactic Acid, Venous: 2.5 mmol/L (ref 0.5–1.9)
Lactic Acid, Venous: 1.7 mmol/L (ref 0.5–1.9)
Lactic Acid, Venous: 2.5 mmol/L (ref 0.5–1.9)

## 2020-07-17 LAB — TRIGLYCERIDES: Triglycerides: 1549 mg/dL — ABNORMAL HIGH (ref <150)

## 2020-07-17 MED ORDER — ONDANSETRON HCL 4 MG/2ML IJ SOLN
4.0000 mg | Freq: Four times a day (QID) | INTRAMUSCULAR | Status: DC | PRN
  Administered 2020-07-17: 4 mg via INTRAVENOUS
  Filled 2020-07-17: qty 2

## 2020-07-17 MED ORDER — IOHEXOL 300 MG/ML  SOLN: 150.0000 mL | Freq: Once | INTRAMUSCULAR | Status: DC | PRN

## 2020-07-17 MED ORDER — NICARDIPINE HCL IN NACL 20-0.86 MG/200ML-% IV SOLN
3.0000 mg/h | INTRAVENOUS | Status: DC
  Administered 2020-07-17: 5 mg/h via INTRAVENOUS
  Administered 2020-07-17: 7.5 mg/h via INTRAVENOUS
  Administered 2020-07-17: 5 mg/h via INTRAVENOUS
  Administered 2020-07-18: 3 mg/h via INTRAVENOUS
  Filled 2020-07-17 (×6): qty 200

## 2020-07-17 MED ORDER — PANTOPRAZOLE SODIUM 40 MG IV SOLR
40.0000 mg | INTRAVENOUS | Status: DC
  Administered 2020-07-17 – 2020-07-18 (×2): 40 mg via INTRAVENOUS
  Filled 2020-07-17 (×2): qty 40

## 2020-07-17 MED ORDER — MUPIROCIN 2 % EX OINT
1.0000 "application " | TOPICAL_OINTMENT | Freq: Two times a day (BID) | CUTANEOUS | Status: DC
  Administered 2020-07-17 – 2020-07-20 (×7): 1 via NASAL
  Filled 2020-07-17 (×3): qty 22

## 2020-07-17 MED ORDER — CHLORHEXIDINE GLUCONATE CLOTH 2 % EX PADS
6.0000 | MEDICATED_PAD | Freq: Every day | CUTANEOUS | Status: DC
  Administered 2020-07-18 – 2020-07-19 (×3): 6 via TOPICAL

## 2020-07-17 MED ORDER — CLEVIDIPINE BUTYRATE 0.5 MG/ML IV EMUL
0.0000 mg/h | INTRAVENOUS | Status: DC
  Administered 2020-07-17: 8 mg/h via INTRAVENOUS
  Administered 2020-07-17: 4 mg/h via INTRAVENOUS
  Filled 2020-07-17: qty 100
  Filled 2020-07-17: qty 50
  Filled 2020-07-17: qty 100

## 2020-07-17 MED ORDER — SODIUM CHLORIDE 0.9 % IV SOLN: INTRAVENOUS | Status: DC

## 2020-07-17 MED ORDER — PERFLUTREN LIPID MICROSPHERE
1.0000 mL | INTRAVENOUS | Status: AC | PRN
  Filled 2020-07-17: qty 10

## 2020-07-17 MED ORDER — ONDANSETRON HCL 4 MG/2ML IJ SOLN
INTRAMUSCULAR | Status: DC | PRN
  Administered 2020-07-17: 4 mg via INTRAVENOUS

## 2020-07-17 MED ORDER — CLEVIDIPINE BUTYRATE 0.5 MG/ML IV EMUL
INTRAVENOUS | Status: AC
  Filled 2020-07-17: qty 50

## 2020-07-17 MED ORDER — FENTANYL CITRATE (PF) 100 MCG/2ML IJ SOLN: 50.0000 ug | INTRAMUSCULAR | Status: DC | PRN

## 2020-07-17 MED ORDER — CHLORHEXIDINE GLUCONATE 0.12% ORAL RINSE (MEDLINE KIT)
15.0000 mL | Freq: Two times a day (BID) | OROMUCOSAL | Status: DC
  Administered 2020-07-17 (×2): 15 mL via OROMUCOSAL

## 2020-07-17 MED ORDER — FENTANYL CITRATE (PF) 100 MCG/2ML IJ SOLN
50.0000 ug | INTRAMUSCULAR | Status: DC | PRN
  Administered 2020-07-17: 50 ug via INTRAVENOUS
  Filled 2020-07-17: qty 2

## 2020-07-17 MED ORDER — PROPOFOL 1000 MG/100ML IV EMUL
5.0000 ug/kg/min | INTRAVENOUS | Status: DC
  Administered 2020-07-17: 35 ug/kg/min via INTRAVENOUS
  Administered 2020-07-17: 30 ug/kg/min via INTRAVENOUS
  Filled 2020-07-17 (×2): qty 100

## 2020-07-17 MED ORDER — PROPOFOL 500 MG/50ML IV EMUL
INTRAVENOUS | Status: DC | PRN
Start: 2020-07-17 — End: 2020-07-17
  Administered 2020-07-17: 25 ug/kg/min via INTRAVENOUS

## 2020-07-17 MED ORDER — CEFAZOLIN SODIUM-DEXTROSE 2-3 GM-%(50ML) IV SOLR
INTRAVENOUS | Status: DC | PRN
  Administered 2020-07-16: 2 g via INTRAVENOUS

## 2020-07-17 MED ORDER — HEPARIN SODIUM (PORCINE) 5000 UNIT/ML IJ SOLN
5000.0000 [IU] | Freq: Three times a day (TID) | INTRAMUSCULAR | Status: DC
  Administered 2020-07-17 – 2020-07-20 (×9): 5000 [IU] via SUBCUTANEOUS
  Filled 2020-07-17 (×10): qty 1

## 2020-07-17 MED ORDER — ORAL CARE MOUTH RINSE
15.0000 mL | OROMUCOSAL | Status: DC
  Administered 2020-07-17: 15 mL via OROMUCOSAL

## 2020-07-17 MED ORDER — SUGAMMADEX SODIUM 200 MG/2ML IV SOLN
INTRAVENOUS | Status: DC | PRN
Start: 2020-07-17 — End: 2020-07-17
  Administered 2020-07-17: 200 mg via INTRAVENOUS

## 2020-07-17 MED ORDER — POLYETHYLENE GLYCOL 3350 17 G PO PACK: 17.0000 g | PACK | Freq: Every day | ORAL | Status: DC

## 2020-07-17 NOTE — Progress Notes (Signed)
Patient transported on vent to MRI and returned to 4N19 without complications.

## 2020-07-17 NOTE — Consult Note (Addendum)
NAME:  Tanya Lopez, MRN:  [id number], DOB:  [birth year], LOS: 1 ADMISSION DATE:  07/16/2020, CONSULTATION DATE:  07/17/20 REFERRING MD:  Amada Jupiter, CHIEF COMPLAINT:  R-sided weakness   Brief History   49 year old female with past medical history of hypertension who presented with sudden onset right-sided weakness and aphasia that began at 2107 and was witnessed by family.  Underwent code stroke work-up in the ED, found to have small cortical SAH and possible left ACA A2 dissection.  Left intubated and PCCM consulted for vent management.  History of present illness   49 year old female who has a history of hypertension, does not appear to be on any home medications, who was apparently in her usual state of health and was in the kitchen when she began experiencing numbness in the right leg and developed trouble speaking.  This was witnessed by family who brought her immediately to the emergency department.  An initial head CT showed small cortical SAH, therefore patient was not a candidate for TPA.  There is a question of cortical vein thrombosis on CTA, therefore patient underwent cerebral angiogram which was showed smooth areas of narrowing left ACA A2 branches of A2 suspicious for dissection.  She was left intubated post procedure and PCCM consulted for ventilator management.  Past Medical History  Hypertension  Significant Hospital Events   7/24 Admit to Neurology, intubated for CTA and admit to ICU  Consults:  PCCM  Procedures:  7/25 Cerebral Angiogram>>Smooth areas of  narrowing of Lt ACA A2 and branches of A2.. suspicious for dissection  7/25 ETT  Significant Diagnostic Tests:  7/24 CT head>>Focal subarachnoid hemorrhage in the medial posterior left  frontal lobe 7/24 CTA head and neck>>Left A2 segment occlusion with poor reconstitution of distal branch vessels. Incomplete filling of posterior left frontal cortical vein could represent cortical vein  thrombosis. 7/25 MRI brain>>  Micro Data:  7/24 SARS-CoV-2>> negative 1/24 MRSA screen>>  Antimicrobials:  Cefazolin 7/24-  Interim history/subjective:  Patient arrived to intensive care unit after angiogram hemodynamically stable, had an episode of emesis.  Objective   Blood pressure (!) 137/73, pulse 98, temperature 98.2 F (36.8 C), temperature source Axillary, resp. rate 18, height 5\' 3"  (1.6 m), weight (!) 95.3 kg, SpO2 100 %.    Vent Mode: PRVC FiO2 (%):  [100 %] 100 % Set Rate:  [16 bmp] 16 bmp Vt Set:  [410 mL] 410 mL PEEP:  [5 cmH20] 5 cmH20 Plateau Pressure:  [16 cmH20] 16 cmH20   Intake/Output Summary (Last 24 hours) at 07/17/2020 0207 Last data filed at 07/17/2020 0132 Gross per 24 hour  Intake 600 ml  Output 50 ml  Net 550 ml   Filed Weights   07/16/20 2200  Weight: (!) 95.3 kg   General: Well-nourished female, intubated and sedated HEENT: MM pink/moist, ETT in place Neuro: Pupils 3 mm bilaterally and reactive to light, spontaneously moving the left arm, grimaces to pain, no withdrawal to pain R extremities, no noted facial droop CV: s1s2 rrr, no m/r/g PULM: CTA B GI: soft, mildly distended bsx4 active  Extremities: warm/dry, no edema  Skin: no rashes or lesions   Resolved Hospital Problem list     Assessment & Plan:   Acute CVA, L ACA A2 narrowing, suspicious for dissection with small acute cortical SAH  Intubated for neurologic airway protection after cerebral angiogram, PCCM consulted for vent management -Stroke management per neurology, MRI brain in the morning, echo, carotid Dopplers, A1c -May start antiplatelet therapy depending on MRI --  Maintain full vent support with SAT/SBT as tolerated -titrate Vent setting to maintain SpO2 greater than or equal to 90%. -HOB elevated 30 degrees. -Plateau pressures less than 30 cm H20.  -Follow chest x-ray, ABG prn.   -Bronchial hygiene and RT/bronchodilator protocol. -Propofol and fentanyl for sedation  overnight   HTN -Goal SBP 140-160, continue Cleviprex gtt.   AGMA CO2, anion gap 18 -unclear etiology, repeat metabolic panel in the AM and check lactic acid  Best practice:  Diet: N.p.o.  Pain/Anxiety/Delirium protocol (if indicated): Propofol, fentanyl VAP protocol (if indicated): HOB 30 degrees, suction as needed DVT prophylaxis: SCDs GI prophylaxis: Protonix Glucose control: SSI Mobility: Bedrest Code Status: Full code Family Communication: Updated by neurology Disposition: ICU  Labs   CBC: Recent Labs  Lab 07/16/20 2204 07/16/20 2208  WBC 9.5  --   NEUTROABS 4.4  --   HGB 12.1 13.6  HCT 37.9 40.0  MCV 79.6*  --   PLT 407*  --     Basic Metabolic Panel: Recent Labs  Lab 07/16/20 2204 07/16/20 2208  NA 137 139  K 3.9 3.5  CL 103 104  CO2 16*  --   GLUCOSE 97 99  BUN 11 12  CREATININE 0.83 0.80  CALCIUM 9.1  --    GFR: Estimated Creatinine Clearance: 93.5 mL/min (by C-G formula based on SCr of 0.8 mg/dL). Recent Labs  Lab 07/16/20 2204  WBC 9.5    Liver Function Tests: Recent Labs  Lab 07/16/20 2204  AST 34  ALT 22  ALKPHOS 66  BILITOT 0.8  PROT 7.7  ALBUMIN 3.7   No results for input(s): LIPASE, AMYLASE in the last 168 hours. No results for input(s): AMMONIA in the last 168 hours.  ABG    Component Value Date/Time   TCO2 23 07/16/2020 2208     Coagulation Profile: Recent Labs  Lab 07/16/20 2204  INR 0.9    Cardiac Enzymes: No results for input(s): CKTOTAL, CKMB, CKMBINDEX, TROPONINI in the last 168 hours.  HbA1C: No results found for: HGBA1C  CBG: Recent Labs  Lab 07/16/20 2202  GLUCAP 97    Review of Systems:   Unable to obtain secondary to intubation  Past Medical History  She,  has no past medical history on file.   Surgical History     Social History      Family History   Her family history is not on file.   Allergies No Known Allergies   Home Medications  Prior to Admission medications   Not  on File     Critical care time: 50 minutes     CRITICAL CARE Performed by: Darcella Gasman Shenetta Schnackenberg   Total critical care time: 50 minutes  Critical care time was exclusive of separately billable procedures and treating other patients.  Critical care was necessary to treat or prevent imminent or life-threatening deterioration.  Critical care was time spent personally by me on the following activities: development of treatment plan with patient and/or surrogate as well as nursing, discussions with consultants, evaluation of patient's response to treatment, examination of patient, obtaining history from patient or surrogate, ordering and performing treatments and interventions, ordering and review of laboratory studies, ordering and review of radiographic studies, pulse oximetry and re-evaluation of patient's condition.  Darcella Gasman Aneudy Champlain, PA-C

## 2020-07-17 NOTE — Progress Notes (Signed)
STROKE TEAM PROGRESS NOTE   INTERVAL HISTORY Her RN is at the bedside.  Pt lying in bed, still intubated but easily arousable, open eyes and following simple commands. Still has right hemiplegia. Pending MRI and hopefully can extubate today. D/c a line.   OBJECTIVE Vitals:   07/17/20 0345 07/17/20 0400 07/17/20 0500 07/17/20 0600  BP: (!) 139/72 123/67 (!) 139/76 (!) 132/69  Pulse: 94 89 92 88  Resp: 17 16 18 16   Temp:      TempSrc:      SpO2: 100% 100% 100% 100%  Weight:      Height:        CBC:  Recent Labs  Lab 07/16/20 2204 07/16/20 2204 07/16/20 2208 07/17/20 0238  WBC 9.5  --   --   --   NEUTROABS 4.4  --   --   --   HGB 12.1   < > 13.6 12.9  HCT 37.9   < > 40.0 38.0  MCV 79.6*  --   --   --   PLT 407*  --   --   --    < > = values in this interval not displayed.    Basic Metabolic Panel:  Recent Labs  Lab 07/16/20 2204 07/16/20 2204 07/16/20 2208 07/17/20 0238  NA 137   < > 139 136  K 3.9   < > 3.5 3.6  CL 103  --  104  --   CO2 16*  --   --   --   GLUCOSE 97  --  99  --   BUN 11  --  12  --   CREATININE 0.83  --  0.80  --   CALCIUM 9.1  --   --   --    < > = values in this interval not displayed.    Lipid Panel: No results found for: CHOL, TRIG, HDL, CHOLHDL, VLDL, LDLCALC HgbA1c: No results found for: HGBA1C Urine Drug Screen: No results found for: LABOPIA, COCAINSCRNUR, LABBENZ, AMPHETMU, THCU, LABBARB  Alcohol Level No results found for: [facility]  IMAGING  CT Code Stroke CTA Head W/WO contrast CT Code Stroke CTA Neck W/WO contrast 07/16/2020 IMPRESSION:  1. 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  2. Incomplete filling of posterior left frontal cortical vein could represent cortical vein thrombosis. Cerebral arteriogram would be useful for further evaluation of both possibilities.  3. Mild tortuosity of cervical vessels without significant cervical stenosis. This is most commonly seen in the setting of chronic  hypertension.   CT HEAD CODE STROKE WO CONTRAST 07/16/2020 IMPRESSION:  1. Focal subarachnoid hemorrhage in the medial posterior left frontal lobe. Question venous hemorrhage. Vascular malformation considered less likely. Alternatively, could represent hemorrhagic conversion of an occult infarct.  2. Otherwise normal CT appearance of the brain.  3. ASPECTS is 10/10   Neuro Interventional Radiology - Cerebral Angiogram  07/17/2020 S/P 4 vessel cerebral arteriogram - RT CFA approach. Findings. 1. Smooth areas of  narrowing of Lt ACA A2 and branches of A2 suspicious for dissection, less likely ICAD v vasospasm. No intraluminal filling defects noted . 2. Cortical veins and DVsinuses widely patent.  DG Chest Port 1 View 07/17/2020 IMPRESSION:  1. Endotracheal tube as above.  2. Low lung volumes with bibasilar atelectasis.  3. No pneumothorax or large pleural effusion.   Transthoracic Echocardiogram  00/00/2021 Pending  ECG - SR rate 83 BPM. (See cardiology reading for complete details)  PHYSICAL EXAM  Temp:  [98.2 F (36.8 C)-99.4 F (37.4 C)] 98.8 F (37.1 C) (07/25 0800) Pulse Rate:  [81-100] 96 (07/25 0802) Resp:  [16-30] 26 (07/25 0802) BP: (121-195)/(64-104) 153/78 (07/25 0802) SpO2:  [95 %-100 %] 100 % (07/25 0802) Arterial Line BP: (128-171)/(72-92) 146/75 (07/25 0715) FiO2 (%):  [40 %-100 %] 40 % (07/25 0802) Weight:  [95.3 kg] 95.3 kg (07/24 2200)  General - Well nourished, well developed, intubated on sedation.  Ophthalmologic - fundi not visualized due to noncooperation.  Cardiovascular - Regular rate and rhythm.  Neuro - intubated on propofol, eyes closed but easily open on voice, following simple commands. With eye opening, eyes in mid position, able to have bilateral gaze, bilaterally blinking to visual threat, able to track bilaterally, PERRL. Corneal reflex present, gag and cough present. Breathing over the vent.  Facial symmetry not able to test due to ET  tube.  Tongue protrusion not able to perform with ET. Left UE at least 4/5 and LLE at least 3/5 On pain stimulation, no movement of right UE and LE. DTR 1+ and no babinski. Sensation, coordination not cooperative and gait not tested.   ASSESSMENT/PLAN [name] is a 49 y.o. female with history of hypertension who presents with right sided weakness and aphasia. She did not receive IV t-PA due to a small cortical subarachnoid that appeared acute. CTA -> left A2 occlusion. Cerebral angiogram -> narrowing of Lt ACA A2 and branches of A2 suspicious for dissection.  Stroke: Focal small SAH in the left frontal parafalcine with left A2 segment occlusion - possible dissection  CT Head - Focal subarachnoid hemorrhage in the medial posterior left frontal lobe.  ASPECTS is 10/10   CTA H&N - Left A2 segment occlusion with poor reconstitution of distal branch vessels. Incomplete filling of posterior left frontal cortical vein could represent cortical vein thrombosis.  Cerebral Angiogram - Smooth areas of  narrowing of Lt ACA A2 and branches of A2 suspicious for dissection, less likely ICAD v vasospasm. No intraluminal filling defects noted. No cortical venous thrombosis  MRI head - pending  MRA head - pending  2D Echo - pending  Sars Corona Virus 2 - negative  LDL - pending  HgbA1c - pending  VTE prophylaxis - heparin IV  No antithrombotic prior to admission, now on aspirin 300 mg suppository daily  Ongoing aggressive stroke risk factor management  Therapy recommendations:  pending  Disposition:  Pending  Respiratory failure  Intubated for IR procedure  Remain intubated post op for airway protection  On weaning now  CCM on board  Hope for extubation today  Hypertension  Home BP meds: none listed  Current BP meds: Cleviprex  Stable . SBP goal < 160 . Long-term BP goal normotensive  Hyperlipidemia  Home Lipid lowering medication: none listed  LDL  pending, goal < 70  Current lipid lowering medication: none   Continue statin at discharge  Other Stroke Risk Factors  Obesity, Body mass index is 37.22 kg/m., recommend weight loss, diet and exercise as appropriate   Other Active Problems  Code status - Full Code   Hospital day # 1  This patient is critically ill due to [facility], left ACA occlusion s/p IR, right hemiplegia, hypertensive emergency and at significant risk of neurological worsening, death form recurrent stroke, SAH worsening, hemorrhagic conversion, seizure, hypertensive encephalopathy. This patient's care requires constant monitoring of vital signs, hemodynamics, respiratory and cardiac monitoring, review of multiple databases, neurological assessment, discussion with family, other specialists and  medical decision making of high complexity. I spent 40 minutes of neurocritical care time in the care of this patient.  Marvel Plan, MD PhD Stroke Neurology 07/17/2020 9:14 AM   To contact Stroke Continuity provider, please refer to [email or web address]. After hours, contact General Neurology

## 2020-07-17 NOTE — Procedures (Signed)
S/P 4 vessel cerebral arteriogram RT CFA approach. Findings. 1. Smooth areas of  narrowing of Lt ACA A2 and branches of A2.. suspicious for dissection ,less likely ICAD v vasospasm. No intraluminal filling defects noted . 2. Cortical veins and DVsinuses widely patent. S.Faizon Capozzi MD. Patient left intubated to protect airway.  Distal pulses all dopplerable.. RT groin soft.Marland Kitchen S.Charlie Char MD

## 2020-07-17 NOTE — Progress Notes (Signed)
PCCM morning follow-up note  Patient is a 49 year old female with significant past medical history of hypertension who presents with sudden right-sided weakness and aphasia that began at 2107.  Code stroke called on on arrival and found to have small cortical subarachnoid and possible left ACA A2 dissection.  PCCM consulted as patient was left intubated post IR four-vessel cerebral arteriogram.  Patient was admitted by overnight team, please see original consultation note for further details.  Assessment and plan  Acute CVA and suspicion for dissection of small acute cortical SAH -Intubated for neurological airway protection after cerebral angiogram Plan: Management per neurology  MRI brain pending Obtain echo, carotid Dopplers, and obtain hemoglobin A1c Maintain neuro protective measures; goal for eurothermia, euglycemia, eunatermia, normoxia, and PCO2 goal of 35-40 Nutrition and bowel regiment  Seizure precautions  AEDs per neurology   Acute respiratory insufficiency in the setting of acute CVA P: Continue ventilator support with lung protective strategies  SBT after MRI if passes may be able to extubate  Wean PEEP and FiO2 for sats greater than 90%. Head of bed elevated 30 degrees. Plateau pressures less than 30 cm H20.  Follow intermittent chest x-ray and ABG.   Ensure adequate pulmonary hygiene  Follow cultures  VAP bundle in place  PAD protocol  Lactic acidosis AGMA -Patient seen with anion gap of 18 prompting assessment of lactic acid which was elevated at 2.5.  Unclear etiology possibly reactive secondary to acute CVA -Medication reviewed but it appears patient is not on any maintenance medication at home -UA slightly suspicious for infection given few bacteria and moderate leukocytes, unable to assess subjective urinary symptoms P: Repeat lactic Trend CBC and fever curve Low threshold to initiate antibiotics Trend lactic IV hydration  Hyperlipidemia -Lipid panel  obtained and revealed elevated cholesterol 302 and triglycerides elevated at 1600, this may be due to cleviprex drip  P: Repeat Lipid panel 7/26  Delfin Gant, NP-C [facility] Contact / Pager information can be found on Amion  07/17/2020, 7:34 AM

## 2020-07-17 NOTE — Progress Notes (Signed)
SLP Cancellation Note  Patient Details Name: Hetty Linhart MRN: [id number] DOB: 05-04-1971   Cancelled treatment:       Reason Eval/Treat Not Completed: Medical issues which prohibited therapy (Pt is currently intubated. SLP will follow up.)  Alanzo Lamb I. Vear Clock, MS, CCC-SLP Acute Rehabilitation Services Office number [phone] Pager [phone]  Scheryl Marten 07/17/2020, 8:02 AM

## 2020-07-17 NOTE — Procedures (Signed)
Extubation Procedure Note  Patient Details:   Name: Tanya Lopez DOB: 10/02/1971 MRN: [id number]   Airway Documentation:    Vent end date: 07/17/20 Vent end time: 1525   Evaluation  O2 sats: stable throughout Complications: No apparent complications Patient did tolerate procedure well. Bilateral Breath Sounds: Clear   No. Patient able to follow commands.  Positive cuff leak noted. Patient placed on 3 L [place] with humidity, no stridor noted. Patient able to follow commands and reached using the incentive spirometer.  Rayburn Felt 07/17/2020, 3:34 PM

## 2020-07-17 NOTE — Transfer of Care (Signed)
Immediate Anesthesia Transfer of Care Note  Patient: Tanya Lopez  Procedure(s) Performed: IR WITH ANESTHESIA (N/A )  Patient Location: ICU  Anesthesia Type:General  Level of Consciousness: sedated and Patient remains intubated per anesthesia plan  Airway & Oxygen Therapy: Patient remains intubated per anesthesia plan and Patient placed on Ventilator (see vital sign flow sheet for setting)  Post-op Assessment: Report given to RN and Post -op Vital signs reviewed and stable  Post vital signs: Reviewed and stable. Patient moving left side.  Last Vitals:  Vitals Value Taken Time  BP    Temp    Pulse 104 07/17/20 0135  Resp 20 07/17/20 0135  SpO2 100 % 07/17/20 0135  Vitals shown include unvalidated device data.  Last Pain: There were no vitals filed for this visit.       Complications: No complications documented.

## 2020-07-17 NOTE — Anesthesia Procedure Notes (Signed)
Arterial Line Insertion Start/End7/24/2021 11:33 PM, 07/16/2020 11:34 PM Performed by: Edmonia Caprio, CRNA, CRNA  Preanesthetic checklist: patient identified, IV checked, monitors and equipment checked and pre-op evaluation Emergency situation Patient sedated radial was placed Catheter size: 20 G Hand hygiene performed  and maximum sterile barriers used   Attempts: 1 Procedure performed without using ultrasound guided technique. Following insertion, dressing applied and Biopatch. Post procedure assessment: normal  Patient tolerated the procedure well with no immediate complications.

## 2020-07-17 NOTE — Progress Notes (Signed)
[facility] PCCM  Awake, following commands Passing SBT for several hours Extubate D/c sedation protocol, vent protocol NPO until SLP, ice chips OK Maintain nicardipine after  Tanya [place], MD [facility] Pager: [phone] Cell: [phone] If no response, call [phone]

## 2020-07-17 NOTE — Progress Notes (Signed)
[facility] PCCM  49 y/o female presented with right sided weakness last night, had code stoke called, small cortical subarachnoid and left ACA A2 dissection, went for angiogram which was re-assuring.    Weaning sedation and vent now, hoping for extubation.  Unclear etiology of slightly elevated lactic acid.  Not in shock, remainder of exam WNL  Updated family bedside  Heber [place], MD [place] Pager: [phone] Cell: [phone] If no response, call [phone]

## 2020-07-17 NOTE — Progress Notes (Signed)
Angiog shows likely dissection, no cortical thrombosis. Will repeat iamging with MRI in the AM and as long as SAH is stable, could consider antiplatelets.   Ritta Slot, MD [facility] [phone]  If 7pm- 7am, please page neurology on call as listed in AMION.

## 2020-07-17 NOTE — Progress Notes (Signed)
Referring Physician(s): Ritta Slot  Supervising Physician: Julieanne Cotton  Patient Status:  [facility] - In-pt  Chief Complaint: Follow up cerebral angiogram early this AM in NIR  Subjective:  Remains intubated, ventilated, sedated. No family or staff at bedside during exam. Does not arouse to loud voice or painful stimuli.  Allergies: Patient has no known allergies.  Medications: Prior to Admission medications   Not on File     Vital Signs: BP (!) 153/78   Pulse 96   Temp 98.8 F (37.1 C) (Axillary)   Resp (!) 26   Ht 5\' 3"  (1.6 m)   Wt (!) 210 lb 1.6 oz (95.3 kg)   SpO2 100%   BMI 37.22 kg/m   Physical Exam Vitals and nursing note reviewed.  Constitutional:      Appearance: She is obese.     Comments: Intubated, sedated. No visitors/staff at bedside.  HENT:     Head: Normocephalic.  Cardiovascular:     Rate and Rhythm: Normal rate and regular rhythm.     Comments: (+) right CFA puncture site clean, dry, dressed appropriately without active bleeding or drainage. Non-pulsatile.  Palpable right DP Pulmonary:     Comments: Intubated, ventilated Abdominal:     Palpations: Abdomen is soft.  Skin:    General: Skin is warm and dry.     Imaging: CT Code Stroke CTA Head W/WO contrast  Result Date: 07/16/2020 CLINICAL DATA:  Right-sided facial droop. Right-sided numbness. Posteromedial frontal lobe subarachnoid hemorrhage. EXAM: CT ANGIOGRAPHY HEAD AND NECK TECHNIQUE: Multidetector CT imaging of the head and neck was performed using the standard protocol during bolus administration of intravenous contrast. Multiplanar CT image reconstructions and MIPs were obtained to evaluate the vascular anatomy. Carotid stenosis measurements (when applicable) are obtained utilizing NASCET criteria, using the distal internal carotid diameter as the denominator. CONTRAST:  39mL OMNIPAQUE IOHEXOL 350 MG/ML SOLN COMPARISON:  CT head without contrast 07/16/2020 FINDINGS: CTA  NECK FINDINGS Aortic arch: A 3 vessel arch configuration is present. No significant atherosclerotic disease is present. No stenosis or aneurysm is present. Right carotid system: The right common carotid artery is mildly tortuous. No significant stenosis is present. The bifurcation is unremarkable. Mild tortuosity is present in the proximal cervical right ICA without significant stenosis. Left carotid system: The left common carotid artery is within normal limits. The bifurcation is unremarkable. Mild tortuosity is present in the cervical left ICA without significant stenosis. Vertebral arteries: The left vertebral artery is the dominant vessel. Both vertebral arteries originate from the subclavian arteries without significant stenosis. No significant stenosis are present in either vertebral artery in the neck. Skeleton: Vertebral body heights and alignment are normal. Straightening of the normal cervical lordosis is noted. No focal lytic or blastic lesions are present. Other neck: The soft tissues the neck are otherwise unremarkable. Salivary glands are within normal limits. Thyroid is normal. No significant adenopathy is present. Upper chest: The lung apices demonstrate mild dependent atelectasis. No focal nodule, mass, or airspace disease is present. The thoracic inlet is within normal limits. Review of the MIP images confirms the above findings CTA HEAD FINDINGS Anterior circulation: The internal carotid arteries are within normal limits through the ICA termini bilaterally. The A1 and M1 segments are normal. Left A2 segment occlusion is present with no reconstitution of distal branch vessels on the left. Right ACA branch vessels are within normal limits. MCA branch vessels are normal bilaterally. Posterior circulation: The left vertebral artery is the dominant vessel. PICA  origins are visualized and normal. The vertebrobasilar junction is normal. Mild narrowing is present in the distal basilar artery. Fetal type  right posterior cerebral artery is present. Both posterior cerebral arteries originate from the basilar tip. Prominent posterior communicating arteries are present bilaterally. PCA branch vessels are within normal limits. Venous sinuses: The dural sinuses are patent. The straight sinus deep cerebral veins are intact. Cortical vein near the area subarachnoid hemorrhage appears incomplete to the sagittal sinus. Cortical veins are otherwise within normal limits. Anatomic variants: Prominent posterior communicating arteries bilaterally. Review of the MIP images confirms the above findings IMPRESSION: 1. 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 2. Incomplete filling of posterior left frontal cortical vein could represent cortical vein thrombosis. Cerebral arteriogram would be useful for further evaluation of both possibilities. 3. Mild tortuosity of cervical vessels without significant cervical stenosis. This is most commonly seen in the setting of chronic hypertension. These results were called by telephone at the time of interpretation on 07/16/2020 at 10:42 pm to provider MCNEILL [facility] , who verbally acknowledged these results. Electronically Signed   By: Marin Roberts M.D.   On: 07/16/2020 22:43   CT Code Stroke CTA Neck W/WO contrast  Result Date: 07/16/2020 CLINICAL DATA:  Right-sided facial droop. Right-sided numbness. Posteromedial frontal lobe subarachnoid hemorrhage. EXAM: CT ANGIOGRAPHY HEAD AND NECK TECHNIQUE: Multidetector CT imaging of the head and neck was performed using the standard protocol during bolus administration of intravenous contrast. Multiplanar CT image reconstructions and MIPs were obtained to evaluate the vascular anatomy. Carotid stenosis measurements (when applicable) are obtained utilizing NASCET criteria, using the distal internal carotid diameter as the denominator. CONTRAST:  55mL OMNIPAQUE IOHEXOL 350 MG/ML SOLN COMPARISON:  CT head  without contrast 07/16/2020 FINDINGS: CTA NECK FINDINGS Aortic arch: A 3 vessel arch configuration is present. No significant atherosclerotic disease is present. No stenosis or aneurysm is present. Right carotid system: The right common carotid artery is mildly tortuous. No significant stenosis is present. The bifurcation is unremarkable. Mild tortuosity is present in the proximal cervical right ICA without significant stenosis. Left carotid system: The left common carotid artery is within normal limits. The bifurcation is unremarkable. Mild tortuosity is present in the cervical left ICA without significant stenosis. Vertebral arteries: The left vertebral artery is the dominant vessel. Both vertebral arteries originate from the subclavian arteries without significant stenosis. No significant stenosis are present in either vertebral artery in the neck. Skeleton: Vertebral body heights and alignment are normal. Straightening of the normal cervical lordosis is noted. No focal lytic or blastic lesions are present. Other neck: The soft tissues the neck are otherwise unremarkable. Salivary glands are within normal limits. Thyroid is normal. No significant adenopathy is present. Upper chest: The lung apices demonstrate mild dependent atelectasis. No focal nodule, mass, or airspace disease is present. The thoracic inlet is within normal limits. Review of the MIP images confirms the above findings CTA HEAD FINDINGS Anterior circulation: The internal carotid arteries are within normal limits through the ICA termini bilaterally. The A1 and M1 segments are normal. Left A2 segment occlusion is present with no reconstitution of distal branch vessels on the left. Right ACA branch vessels are within normal limits. MCA branch vessels are normal bilaterally. Posterior circulation: The left vertebral artery is the dominant vessel. PICA origins are visualized and normal. The vertebrobasilar junction is normal. Mild narrowing is present  in the distal basilar artery. Fetal type right posterior cerebral artery is present. Both posterior cerebral  arteries originate from the basilar tip. Prominent posterior communicating arteries are present bilaterally. PCA branch vessels are within normal limits. Venous sinuses: The dural sinuses are patent. The straight sinus deep cerebral veins are intact. Cortical vein near the area subarachnoid hemorrhage appears incomplete to the sagittal sinus. Cortical veins are otherwise within normal limits. Anatomic variants: Prominent posterior communicating arteries bilaterally. Review of the MIP images confirms the above findings IMPRESSION: 1. 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 2. Incomplete filling of posterior left frontal cortical vein could represent cortical vein thrombosis. Cerebral arteriogram would be useful for further evaluation of both possibilities. 3. Mild tortuosity of cervical vessels without significant cervical stenosis. This is most commonly seen in the setting of chronic hypertension. These results were called by telephone at the time of interpretation on 07/16/2020 at 10:42 pm to provider MCNEILL [facility] , who verbally acknowledged these results. Electronically Signed   By: Marin Roberts M.D.   On: 07/16/2020 22:43   DG Chest Port 1 View  Result Date: 07/17/2020 CLINICAL DATA:  Aspiration. EXAM: PORTABLE CHEST 1 VIEW COMPARISON:  None. FINDINGS: The endotracheal tube terminates approximately 4.4 cm above the carina. The lung volumes are low. Bibasilar atelectasis is noted. There is no pneumothorax. No large pleural effusion. IMPRESSION: 1. Endotracheal tube as above. 2. Low lung volumes with bibasilar atelectasis. 3. No pneumothorax or large pleural effusion. Electronically Signed   By: Katherine Mantle M.D.   On: 07/17/2020 02:44   CT HEAD CODE STROKE WO CONTRAST  Result Date: 07/16/2020 CLINICAL DATA:  Code stroke. Right-sided facial  droop. Right-sided numbness. EXAM: CT HEAD WITHOUT CONTRAST TECHNIQUE: Contiguous axial images were obtained from the base of the skull through the vertex without intravenous contrast. COMPARISON:  None. FINDINGS: Brain: Focal subarachnoid hemorrhage is present in the medial posterior left frontal lobe. No other areas of hemorrhage are present. No focal cortical abnormality is present. Cortex adjacent to the subarachnoid hemorrhage is obscured. Insular ribbon is normal. Basal ganglia are normal. The brainstem and cerebellum are within normal limits. Vascular: No hyperdense vessel or unexpected calcification. Skull: Calvarium is intact. No focal lytic or blastic lesions are present. No significant extracranial soft tissue lesion is present. Sinuses/Orbits: A polyp or mucous retention cyst is present along the floor of the left maxillary sinus posteriorly. The paranasal sinuses and mastoid air cells are otherwise clear. The globes and orbits are within normal limits. ASPECTS [facility] Stroke Program Early CT Score) - Ganglionic level infarction (caudate, lentiform nuclei, internal capsule, insula, M1-M3 cortex): 7/7 - Supraganglionic infarction (M4-M6 cortex): 3/3 Total score (0-10 with 10 being normal): 10/10 IMPRESSION: 1. Focal subarachnoid hemorrhage in the medial posterior left frontal lobe. Question venous hemorrhage. Vascular malformation considered less likely. Alternatively, could represent hemorrhagic conversion of an occult infarct. 2. Otherwise normal CT appearance of the brain. 3. ASPECTS is 10/10 The above was relayed via text pager to Dr. Amada Jupiter on 07/16/2020 at 22:12 . Electronically Signed   By: Marin Roberts M.D.   On: 07/16/2020 22:17    Labs:  CBC: Recent Labs    07/16/20 2204 07/16/20 2208 07/17/20 0238  WBC 9.5  --   --   HGB 12.1 13.6 12.9  HCT 37.9 40.0 38.0  PLT 407*  --   --     COAGS: Recent Labs    07/16/20 2204  INR 0.9  APTT 30    BMP: Recent Labs     07/16/20 2204 07/16/20 2208 07/17/20 0238  NA 137 139 136  K 3.9 3.5 3.6  CL 103 104  --   CO2 16*  --   --   GLUCOSE 97 99  --   BUN 11 12  --   CALCIUM 9.1  --   --   CREATININE 0.83 0.80  --   GFRNONAA >60  --   --   GFRAA >60  --   --     LIVER FUNCTION TESTS: Recent Labs    07/16/20 2204  BILITOT 0.8  AST 34  ALT 22  ALKPHOS 66  PROT 7.7  ALBUMIN 3.7    Assessment and Plan:  49 y/o F who presented to [facility] ED late yesterday night after sudden onset of RLE weakness and aphasia. CTA showed left A2 occlusion and [facility] which showed smooth areas of narrowing of the left ACA A2 and branches of A2 suspicious for dissection, less likely ICAD vs vasospasm, no intraluminal filling defects noted with widely patent cortical veins and DV sinuses. No intervention performed and patient was left intubated for airway protection.  On my exam today patient remains intubated, ventilated, sedated. Does not respond to verbal/tactile stimuli. Right CFA puncture site unremarkable, right dorsali pedis pulse palpable.  Continue routine wound care of right CFA puncture site, further plans per neurology at this time - NIR remains available as needed, please call with questions or concerns.   Electronically Signed: Villa HerbShannon A Merrie Epler, PA-C 07/17/2020, 9:27 AM   I spent a total of 15 Minutes at the the patient's bedside AND on the patient's hospital floor or unit, greater than 50% of which was counseling/coordinating care for follow up cerebral angiogram.

## 2020-07-17 NOTE — Progress Notes (Addendum)
  Echocardiogram 2D Echocardiogram has been performed with Definity.  Gerda Diss 07/17/2020, 2:14 PM

## 2020-07-17 NOTE — Anesthesia Postprocedure Evaluation (Signed)
Anesthesia Post Note  Patient: Tanya Lopez  Procedure(s) Performed: IR WITH ANESTHESIA (N/A )     Patient location during evaluation: ICU Anesthesia Type: General Level of consciousness: sedated Pain management: pain level controlled Vital Signs Assessment: post-procedure vital signs reviewed and stable Respiratory status: patient remains intubated per anesthesia plan Cardiovascular status: stable Postop Assessment: no apparent nausea or vomiting Anesthetic complications: no   No complications documented.  Last Vitals:  Vitals:   07/17/20 0330 07/17/20 0345  BP: (!) 132/73 (!) 139/72  Pulse: 95 94  Resp: 20 17  Temp:    SpO2: 100% 100%    Last Pain:  Vitals:   07/17/20 0200  TempSrc: Axillary                 Takyah Ciaramitaro P Harvey Lingo

## 2020-07-17 NOTE — Progress Notes (Signed)
PT Cancellation Note  Patient Details Name: Markayla Reichart MRN: [id number] DOB: 1971/08/13   Cancelled Treatment:    Reason Eval/Treat Not Completed: Patient not medically ready   Noted sedated on vent this morning;  Will follow;   Van Clines, PT  Acute Rehabilitation Services Pager [phone] Office [phone]    Levi Aland 07/17/2020, 7:26 AM

## 2020-07-18 ENCOUNTER — Inpatient Hospital Stay (HOSPITAL_COMMUNITY): Payer: BC Managed Care – PPO

## 2020-07-18 ENCOUNTER — Encounter (HOSPITAL_COMMUNITY): Payer: Self-pay | Admitting: Interventional Radiology

## 2020-07-18 DIAGNOSIS — R338 Other retention of urine: Secondary | ICD-10-CM

## 2020-07-18 DIAGNOSIS — E782 Mixed hyperlipidemia: Secondary | ICD-10-CM

## 2020-07-18 DIAGNOSIS — I639 Cerebral infarction, unspecified: Secondary | ICD-10-CM

## 2020-07-18 LAB — LIPID PANEL
Cholesterol: 222 mg/dL — ABNORMAL HIGH (ref 0–200)
HDL: 37 mg/dL — ABNORMAL LOW (ref >40)
LDL Cholesterol: UNDETERMINED mg/dL (ref 0–99)
Total CHOL/HDL Ratio: 6 RATIO
Triglycerides: 492 mg/dL — ABNORMAL HIGH (ref <150)
VLDL: UNDETERMINED mg/dL (ref 0–40)

## 2020-07-18 LAB — BASIC METABOLIC PANEL
Anion gap: 9 (ref 5–15)
BUN: 5 mg/dL — ABNORMAL LOW (ref 6–20)
CO2: 21 mmol/L — ABNORMAL LOW (ref 22–32)
Calcium: 8.4 mg/dL — ABNORMAL LOW (ref 8.9–10.3)
Chloride: 111 mmol/L (ref 98–111)
Creatinine, Ser: 0.79 mg/dL (ref 0.44–1.00)
GFR calc Af Amer: >60 mL/min (ref >60)
GFR calc non Af Amer: >60 mL/min (ref >60)
Glucose, Bld: 99 mg/dL (ref 70–99)
Potassium: 3.6 mmol/L (ref 3.5–5.1)
Sodium: 141 mmol/L (ref 135–145)

## 2020-07-18 LAB — CBC
HCT: 37.5 % (ref 36.0–46.0)
Hemoglobin: 11.3 g/dL — ABNORMAL LOW (ref 12.0–15.0)
MCH: 24.2 pg — ABNORMAL LOW (ref 26.0–34.0)
MCHC: 30.1 g/dL (ref 30.0–36.0)
MCV: 80.3 fL (ref 80.0–100.0)
Platelets: 319 10*3/uL (ref 150–400)
RBC: 4.67 MIL/uL (ref 3.87–5.11)
RDW: 20.5 % — ABNORMAL HIGH (ref 11.5–15.5)
WBC: 7.6 10*3/uL (ref 4.0–10.5)
nRBC: 0 % (ref 0.0–0.2)

## 2020-07-18 LAB — HEMOGLOBIN A1C
Hgb A1c MFr Bld: 5.6 % (ref 4.8–5.6)
Mean Plasma Glucose: 114.02 mg/dL

## 2020-07-18 LAB — LDL CHOLESTEROL, DIRECT: Direct LDL: 87.5 mg/dL (ref 0–99)

## 2020-07-18 LAB — HIV ANTIBODY (ROUTINE TESTING W REFLEX): HIV Screen 4th Generation wRfx: NONREACTIVE

## 2020-07-18 MED ORDER — ASPIRIN EC 325 MG PO TBEC
325.0000 mg | DELAYED_RELEASE_TABLET | Freq: Every day | ORAL | Status: DC
  Administered 2020-07-19 – 2020-07-20 (×2): 325 mg via ORAL
  Filled 2020-07-18 (×2): qty 1

## 2020-07-18 MED ORDER — ORAL CARE MOUTH RINSE
15.0000 mL | Freq: Two times a day (BID) | OROMUCOSAL | Status: DC
  Administered 2020-07-18: 15 mL via OROMUCOSAL

## 2020-07-18 MED ORDER — PANTOPRAZOLE SODIUM 40 MG PO TBEC
40.0000 mg | DELAYED_RELEASE_TABLET | Freq: Every day | ORAL | Status: DC
  Administered 2020-07-19 – 2020-07-20 (×2): 40 mg via ORAL
  Filled 2020-07-18 (×2): qty 1

## 2020-07-18 MED ORDER — METOPROLOL TARTRATE 5 MG/5ML IV SOLN: 2.5000 mg | INTRAVENOUS | Status: DC | PRN

## 2020-07-18 MED ORDER — ATORVASTATIN CALCIUM 40 MG PO TABS
40.0000 mg | ORAL_TABLET | Freq: Every day | ORAL | Status: DC
  Administered 2020-07-18 – 2020-07-19 (×2): 40 mg via ORAL
  Filled 2020-07-18 (×2): qty 1

## 2020-07-18 MED ORDER — AMLODIPINE BESYLATE 5 MG PO TABS
5.0000 mg | ORAL_TABLET | Freq: Every day | ORAL | Status: DC
  Administered 2020-07-19: 5 mg via ORAL
  Filled 2020-07-18: qty 1

## 2020-07-18 MED ORDER — LABETALOL HCL 5 MG/ML IV SOLN
5.0000 mg | INTRAVENOUS | Status: DC | PRN
  Administered 2020-07-19: 10 mg via INTRAVENOUS
  Filled 2020-07-18: qty 4

## 2020-07-18 MED ORDER — LABETALOL HCL 5 MG/ML IV SOLN: 5.0000 mg | INTRAVENOUS | Status: DC | PRN

## 2020-07-18 NOTE — Discharge Instructions (Signed)
Femoral Site Care °This sheet gives you information about how to care for yourself after your procedure. Your health care provider may also give you more specific instructions. If you have problems or questions, contact your health care provider. °What can I expect after the procedure? °After the procedure, it is common to have: °· Bruising that usually fades within 1-2 weeks. °· Tenderness at the site. °Follow these instructions at home: °Wound care °1. Follow instructions from your health care provider about how to take care of your insertion site. Make sure you: °? Wash your hands with soap and water before you change your bandage (dressing). If soap and water are not available, use hand sanitizer. °? Change your dressing as directed- pressure dressing removed 24 hours post-procedure (and switch for bandaid), bandaid removed 72 hours post-procedure °2. Do not take baths, swim, or use a hot tub for 7 days post-procedure. °3. You may shower 48 hours after the procedure or as told by your health care provider. °? Gently wash the site with plain soap and water. °? Pat the area dry with a clean towel. °? Do not rub the site. This may cause bleeding. °4. Check your femoral site every day for signs of infection. Check for: °? Redness, swelling, or pain. °? Fluid or blood. °? Warmth. °? Pus or a bad smell. °Activity °· Do not stoop, bend, or lift anything that is heavier than 10 lb (4.5 kg) for 2 weeks post-procedure. °· Do not drive self for 2 weeks post-procedure. °Contact a health care provider if you have: °· A fever or chills. °· You have redness, swelling, or pain around your insertion site. °Get help right away if: °· The catheter insertion area swells very fast. °· You pass out. °· You suddenly start to sweat or your skin gets clammy. °· The catheter insertion area is bleeding, and the bleeding does not stop when you hold steady pressure on the area. °· The area near or just beyond the catheter insertion site  becomes pale, cool, tingly, or numb. °These symptoms may represent a serious problem that is an emergency. Do not wait to see if the symptoms will go away. Get medical help right away. Call your local emergency services (911 in the [place] Do not drive yourself to the hospital. ° °This information is not intended to replace advice given to you by your health care provider. Make sure you discuss any questions you have with your health care provider. °Document Revised: 12/23/2017 Document Reviewed: 12/23/2017 °Elsevier Patient Education © 2020 [employer] °

## 2020-07-18 NOTE — Evaluation (Signed)
Occupational Therapy Evaluation Patient Details Name: Tanya Lopez MRN: [id number] DOB: 1971-11-22 Today's Date: 07/18/2020    History of Present Illness Pt presents with an acute onset of right sided weakness and inability to speak.CT head-focal (medial posterior left frontal lobe). 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 Currently pt is totally flaccid on right side, has right inattention and is Max-total A for basic ADLs. She will benefit from acute OT with follow up on CIR.    Follow Up Recommendations  CIR;Supervision/Assistance - 24 hour    Equipment Recommendations  Other (comment) (TBD next venue)       Precautions / Restrictions Precautions Precautions: Fall Precaution Comments: flaccid right side Restrictions Weight Bearing Restrictions: No      Mobility Bed Mobility Overal bed mobility: Needs Assistance Bed Mobility: Rolling;Sidelying to Sit Rolling: Min assist (to right) Sidelying to sit: Max assist (A for RLE and to bring trunk up)          Transfers Overall transfer level: Needs assistance Equipment used: 2 person hand held assist Transfers: Sit to/from [employer] Sit to Stand: Mod assist Stand pivot transfers: Mod assist;+2 physical assistance       General transfer comment: A to block right knee for sit<>stand and stand pivot with A to move RLE as well for stand pivot    Balance Overall balance assessment: Needs assistance Sitting-balance support: Feet supported;Single extremity supported Sitting balance-Leahy Scale: Poor Sitting balance - Comments: tendency for right and posterior lateral lean with min A to correct   Standing balance support: Single extremity supported Standing balance-Leahy Scale:  Zero                             ADL either performed or assessed with clinical judgement   ADL Overall ADL's : Needs assistance/impaired Eating/Feeding: Maximal assistance Eating/Feeding Details (indicate cue type and reason): supported sitting Grooming: Maximal assistance Grooming Details (indicate cue type and reason): supported sitting Upper Body Bathing: Maximal assistance Upper Body Bathing Details (indicate cue type and reason): supported sitting Lower Body Bathing: Maximal assistance Lower Body Bathing Details (indicate cue type and reason): Mod A sit<>stand and maintain standing with right knee blocked Upper Body Dressing : Maximal assistance Upper Body Dressing Details (indicate cue type and reason): supported sitting Lower Body Dressing: Total assistance Lower Body Dressing Details (indicate cue type and reason): Mod A sit<>stand and maintain standing with right knee blocked Toilet Transfer: Moderate assistance;+2 for physical assistance;Stand-pivot;BSC Toilet Transfer Details (indicate cue type and reason): right knee blocked and A to move RLE Toileting- Clothing Manipulation and Hygiene: Total assistance Toileting - Clothing Manipulation Details (indicate cue type and reason): Mod A sit<>stand and maintain standing with right knee blocked             Vision Baseline Vision/History: Wears glasses Wears Glasses: Reading only Vision Assessment?: Yes Eye Alignment: Within Functional Limits Ocular Range of Motion: Within Functional Limits Alignment/Gaze Preference: Within Defined Limits Tracking/Visual Pursuits: Able to track stimulus in all quads without difficulty Additional Comments: She does have a right inattention            Pertinent Vitals/Pain Pain Assessment: Faces Faces Pain Scale: Hurts a little bit Pain Location: RLE with PROM at hip Pain Descriptors /  Indicators: Grimacing Pain Intervention(s): Limited activity within patient's  tolerance;Monitored during session;Repositioned     Hand Dominance Right   Extremity/Trunk Assessment Upper Extremity Assessment Upper Extremity Assessment: RUE deficits/detail RUE Deficits / Details: Flaccid, PROM WNL, no sublux noted RUE Coordination: decreased fine motor;decreased gross motor           Communication Communication Communication: No difficulties (pta)   Cognition Arousal/Alertness: Awake/alert Behavior During Therapy: Flat affect Overall Cognitive Status: Difficult to assess                                 General Comments: Did follow all one step commands              Home Living Family/patient expects to be discharged to:: Private residence Living Arrangements: Spouse/significant other;Children (one in college and one in high school) Available Help at Discharge: Family;Available 24 hours/day Type of Home: House Home Access: Stairs to enter [employer] of Steps: 3 Entrance Stairs-Rails: None Home Layout: One level     Bathroom Shower/Tub: Walk-in shower;Door   [employer]: Standard     Home Equipment: Shower seat - built in          Prior Functioning/Environment Level of Independence: Independent                 OT Problem List: Decreased strength;Decreased range of motion;Impaired balance (sitting and/or standing);Impaired vision/perception;Decreased cognition;Decreased safety awareness;Impaired tone;Impaired UE functional use      OT Treatment/Interventions: Self-care/ADL training;Neuromuscular education;Cognitive remediation/compensation;Therapeutic exercise;Therapeutic activities;Visual/perceptual remediation/compensation;Patient/family education;DME and/or AE instruction;Balance training    OT Goals(Current goals can be found in the care plan section) Acute Rehab OT Goals Patient Stated Goal: husband (agrees she needs rehab) OT Goal Formulation: With patient Time For Goal Achievement:  08/01/20 Potential to Achieve Goals: Good  OT Frequency: Min 3X/week           Co-evaluation PT/OT/SLP Co-Evaluation/Treatment: Yes Reason for Co-Treatment: Complexity of the patient's impairments (multi-system involvement) PT goals addressed during session: Mobility/safety with mobility;Balance;Strengthening/ROM OT goals addressed during session: Strengthening/ROM;ADL's and self-care      AM-PAC OT "6 Clicks" Daily Activity     Outcome Measure Help from another person eating meals?: A Lot Help from another person taking care of personal grooming?: A Lot Help from another person toileting, which includes using toliet, bedpan, or urinal?: A Lot Help from another person bathing (including washing, rinsing, drying)?: A Lot Help from another person to put on and taking off regular upper body clothing?: A Lot Help from another person to put on and taking off regular lower body clothing?: Total 6 Click Score: 11   End of Session Equipment Utilized During Treatment: Gait belt Nurse Communication: Mobility status  Activity Tolerance: Patient tolerated treatment well Patient left: in chair;with call bell/phone within reach;with chair alarm set;with family/visitor present  OT Visit Diagnosis: Unsteadiness on feet (R26.81);Other abnormalities of gait and mobility (R26.89);Muscle weakness (generalized) (M62.81);Other symptoms and signs involving cognitive function;Cognitive communication deficit (R41.841);Hemiplegia and hemiparesis Symptoms and signs involving cognitive functions: Cerebral infarction Hemiplegia - Right/Left: Right Hemiplegia - dominant/non-dominant: Dominant Hemiplegia - caused by: Cerebral infarction                Time: 1572-6203 OT Time Calculation (min): 28 min Charges:  OT General Charges $OT Visit: 1 Visit OT Evaluation $OT Eval Moderate Complexity: 1 Mod  Ignacia Palma, OTR/L Acute [employer] [phone] Office [phone]  Evette Georges 07/18/2020, 12:07 PM

## 2020-07-18 NOTE — Progress Notes (Signed)
Inpatient Rehab Admissions Coordinator Note:   Per PT/OT recommendations, pt was screened for CIR candidacy by Wolfgang Phoenix, MS, CCC-SLP.  At this time we are recommending an Inpatient Rehab consult. AC will contact attending for consult order.   Please contact me with questions.   Wolfgang Phoenix, MS, CCC-SLP Admissions Coordinator [phone] 07/18/20 4:09 PM

## 2020-07-18 NOTE — Progress Notes (Signed)
Referring Physician(s): Code stroke- Rejeana Brock  Supervising Physician: Julieanne Cotton  Tanya Lopez Status:  [facility] - In-pt  Chief Complaint: None- expressive aphasia  Subjective:  Acute CVA s/p diagnostic cerebral arteriogram via right femoral approach revealing smooth areas of narrowing of left ACA A2 branches (suspicious for dissection) along with widely patent cortical veins and DV sinuses 07/17/2020 by Dr. Corliss Skains. Tanya Lopez awake and alert laying in bed. Accompanied by husband at bedside. Follows simple commands but demonstrates expressive aphasia. Moving left side but no spontaneous movements of right side. Right groin puncture site c/d/i.   Allergies: Tanya Lopez has no known allergies.  Medications: Prior to Admission medications   Medication Sig Start Date End Date Taking? Authorizing Provider  amLODipine (NORVASC) 10 MG tablet Take 10 mg by mouth daily.   Yes [provider]  nebivolol (BYSTOLIC) 5 MG tablet Take 5 mg by mouth daily.   Yes [provider]     Vital Signs: BP (!) 155/77    Pulse 93    Temp 98.5 F (36.9 C) (Oral)    Resp 14    Ht  (1.6 m)    Wt (!) 210 lb 1.6 oz (95.3 kg)    SpO2 96%    BMI 37.22 kg/m   Physical Exam Vitals and nursing note reviewed.  Constitutional:      General: Tanya Lopez is not in acute distress. Pulmonary:     Effort: Pulmonary effort is normal. No respiratory distress.  Skin:    General: Skin is warm and dry.     Comments: Right groin puncture site soft without active bleeding or hematoma.  Neurological:     Mental Status: Tanya Lopez is alert.     Comments: Alert and awake. Tanya Lopez follows simple commands but demonstrates expressive aphasia. PERRL bilaterally. Mild right facial droop. Tongue midline. Can spontaneously move left side to command, right side flaccid. Distal pulses (DPs) 2+ bilaterally.     Imaging: CT Code Stroke CTA Head W/WO contrast  Result Date: 07/16/2020 CLINICAL DATA:   Right-sided facial droop. Right-sided numbness. Posteromedial frontal lobe subarachnoid hemorrhage. EXAM: CT ANGIOGRAPHY HEAD AND NECK TECHNIQUE: Multidetector CT imaging of the head and neck was performed using the standard protocol during bolus administration of intravenous contrast. Multiplanar CT image reconstructions and MIPs were obtained to evaluate the vascular anatomy. Carotid stenosis measurements (when applicable) are obtained utilizing NASCET criteria, using the distal internal carotid diameter as the denominator. CONTRAST:  75mL OMNIPAQUE IOHEXOL 350 MG/ML SOLN COMPARISON:  CT head without contrast 07/16/2020 FINDINGS: CTA NECK FINDINGS Aortic arch: A 3 vessel arch configuration is present. No significant atherosclerotic disease is present. No stenosis or aneurysm is present. Right carotid system: The right common carotid artery is mildly tortuous. No significant stenosis is present. The bifurcation is unremarkable. Mild tortuosity is present in the proximal cervical right ICA without significant stenosis. Left carotid system: The left common carotid artery is within normal limits. The bifurcation is unremarkable. Mild tortuosity is present in the cervical left ICA without significant stenosis. Vertebral arteries: The left vertebral artery is the dominant vessel. Both vertebral arteries originate from the subclavian arteries without significant stenosis. No significant stenosis are present in either vertebral artery in the neck. Skeleton: Vertebral body heights and alignment are normal. Straightening of the normal cervical lordosis is noted. No focal lytic or blastic lesions are present. Other neck: The soft tissues the neck are otherwise unremarkable. Salivary glands are within normal limits. Thyroid is normal. No significant adenopathy  is present. Upper chest: The lung apices demonstrate mild dependent atelectasis. No focal nodule, mass, or airspace disease is present. The thoracic inlet is within  normal limits. Review of the MIP images confirms the above findings CTA HEAD FINDINGS Anterior circulation: The internal carotid arteries are within normal limits through the ICA termini bilaterally. The A1 and M1 segments are normal. Left A2 segment occlusion is present with no reconstitution of distal branch vessels on the left. Right ACA branch vessels are within normal limits. MCA branch vessels are normal bilaterally. Posterior circulation: The left vertebral artery is the dominant vessel. PICA origins are visualized and normal. The vertebrobasilar junction is normal. Mild narrowing is present in the distal basilar artery. Fetal type right posterior cerebral artery is present. Both posterior cerebral arteries originate from the basilar tip. Prominent posterior communicating arteries are present bilaterally. PCA branch vessels are within normal limits. Venous sinuses: The dural sinuses are patent. The straight sinus deep cerebral veins are intact. Cortical vein near the area subarachnoid hemorrhage appears incomplete to the sagittal sinus. Cortical veins are otherwise within normal limits. Anatomic variants: Prominent posterior communicating arteries bilaterally. Review of the MIP images confirms the above findings IMPRESSION: 1. 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 2. Incomplete filling of posterior left frontal cortical vein could represent cortical vein thrombosis. Cerebral arteriogram would be useful for further evaluation of both possibilities. 3. Mild tortuosity of cervical vessels without significant cervical stenosis. This is most commonly seen in the setting of chronic hypertension. These results were called by telephone at the time of interpretation on 07/16/2020 at 10:42 pm to provider MCNEILL [facility] , who verbally acknowledged these results. Electronically Signed   By: Marin Roberts M.D.   On: 07/16/2020 22:43   CT Code Stroke CTA Neck W/WO  contrast  Result Date: 07/16/2020 CLINICAL DATA:  Right-sided facial droop. Right-sided numbness. Posteromedial frontal lobe subarachnoid hemorrhage. EXAM: CT ANGIOGRAPHY HEAD AND NECK TECHNIQUE: Multidetector CT imaging of the head and neck was performed using the standard protocol during bolus administration of intravenous contrast. Multiplanar CT image reconstructions and MIPs were obtained to evaluate the vascular anatomy. Carotid stenosis measurements (when applicable) are obtained utilizing NASCET criteria, using the distal internal carotid diameter as the denominator. CONTRAST:  70mL OMNIPAQUE IOHEXOL 350 MG/ML SOLN COMPARISON:  CT head without contrast 07/16/2020 FINDINGS: CTA NECK FINDINGS Aortic arch: A 3 vessel arch configuration is present. No significant atherosclerotic disease is present. No stenosis or aneurysm is present. Right carotid system: The right common carotid artery is mildly tortuous. No significant stenosis is present. The bifurcation is unremarkable. Mild tortuosity is present in the proximal cervical right ICA without significant stenosis. Left carotid system: The left common carotid artery is within normal limits. The bifurcation is unremarkable. Mild tortuosity is present in the cervical left ICA without significant stenosis. Vertebral arteries: The left vertebral artery is the dominant vessel. Both vertebral arteries originate from the subclavian arteries without significant stenosis. No significant stenosis are present in either vertebral artery in the neck. Skeleton: Vertebral body heights and alignment are normal. Straightening of the normal cervical lordosis is noted. No focal lytic or blastic lesions are present. Other neck: The soft tissues the neck are otherwise unremarkable. Salivary glands are within normal limits. Thyroid is normal. No significant adenopathy is present. Upper chest: The lung apices demonstrate mild dependent atelectasis. No focal nodule, mass, or airspace  disease is present. The thoracic inlet is within normal limits. Review of the  MIP images confirms the above findings CTA HEAD FINDINGS Anterior circulation: The internal carotid arteries are within normal limits through the ICA termini bilaterally. The A1 and M1 segments are normal. Left A2 segment occlusion is present with no reconstitution of distal branch vessels on the left. Right ACA branch vessels are within normal limits. MCA branch vessels are normal bilaterally. Posterior circulation: The left vertebral artery is the dominant vessel. PICA origins are visualized and normal. The vertebrobasilar junction is normal. Mild narrowing is present in the distal basilar artery. Fetal type right posterior cerebral artery is present. Both posterior cerebral arteries originate from the basilar tip. Prominent posterior communicating arteries are present bilaterally. PCA branch vessels are within normal limits. Venous sinuses: The dural sinuses are patent. The straight sinus deep cerebral veins are intact. Cortical vein near the area subarachnoid hemorrhage appears incomplete to the sagittal sinus. Cortical veins are otherwise within normal limits. Anatomic variants: Prominent posterior communicating arteries bilaterally. Review of the MIP images confirms the above findings IMPRESSION: 1. 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 2. Incomplete filling of posterior left frontal cortical vein could represent cortical vein thrombosis. Cerebral arteriogram would be useful for further evaluation of both possibilities. 3. Mild tortuosity of cervical vessels without significant cervical stenosis. This is most commonly seen in the setting of chronic hypertension. These results were called by telephone at the time of interpretation on 07/16/2020 at 10:42 pm to provider MCNEILL [facility] , who verbally acknowledged these results. Electronically Signed   By: Marin Roberts M.D.   On:  07/16/2020 22:43   MR ANGIO HEAD WO CONTRAST  Result Date: 07/17/2020 CLINICAL DATA:  Stroke follow-up. Acute right-sided weakness and aphasia. Focal medial posterior left frontal subarachnoid hemorrhage on CT with left A2 occlusion on CTA and possible dissection on catheter angiography. EXAM: MRI HEAD WITHOUT CONTRAST MRA HEAD WITHOUT CONTRAST TECHNIQUE: Multiplanar, multiecho pulse sequences of the brain and surrounding structures were obtained without intravenous contrast. Angiographic images of the head were obtained using MRA technique without contrast. COMPARISON:  Head CT and CTA 07/16/2020 and subsequent catheter angiography FINDINGS: MRI HEAD FINDINGS Brain: There is a moderate-sized acute parasagittal left frontal lobe infarct in the ACA territory with minimal petechial hemorrhage. There is a 7 mm acute right cerebellar infarct, and there are punctate acute infarcts in the left occipital lobe and right thalamus. No significant chronic white matter disease is evident. The ventricles are normal in size. No mass, midline shift, or extra-axial fluid collection is evident. Vascular: Major intracranial vascular flow voids are preserved. Skull and upper cervical spine: Unremarkable bone marrow signal. Sinuses/Orbits: Unremarkable orbits. Small left maxillary sinus mucous retention cyst. Clear mastoid air cells. Other: None. MRA HEAD FINDINGS The study is mildly motion degraded. The visualized distal vertebral arteries are patent to the basilar with the left being mildly dominant. Patent left PICA, left AICA, and bilateral SCA origins are present. A right PICA and right AICA are not clearly identified. The basilar artery is widely patent. There are patent moderate-sized posterior communicating arteries bilaterally. Both PCAs are patent without evidence of a significant proximal stenosis. The internal carotid arteries are widely patent from skull base to carotid termini. ACAs and MCAs are patent proximally  without evidence of a significant A1 or M1 stenosis. Branch vessel evaluation is limited by motion artifact including through the A2 segments, however left A2 flow appears improved from yesterday's CTA although there is still evidence of at least 1 severe focal stenosis.  No aneurysm is identified. IMPRESSION: 1. Moderate-sized acute left ACA territory infarct. 2. Small acute right cerebellar, left occipital, and right thalamic infarcts. 3. Motion degraded head MRA with apparent improved left A2 segment flow compared to yesterday's CTA though with at least 1 persistent severe stenosis. Electronically Signed   By: Sebastian AcheAllen  Grady M.D.   On: 07/17/2020 12:27   MR BRAIN WO CONTRAST  Result Date: 07/17/2020 CLINICAL DATA:  Stroke follow-up. Acute right-sided weakness and aphasia. Focal medial posterior left frontal subarachnoid hemorrhage on CT with left A2 occlusion on CTA and possible dissection on catheter angiography. EXAM: MRI HEAD WITHOUT CONTRAST MRA HEAD WITHOUT CONTRAST TECHNIQUE: Multiplanar, multiecho pulse sequences of the brain and surrounding structures were obtained without intravenous contrast. Angiographic images of the head were obtained using MRA technique without contrast. COMPARISON:  Head CT and CTA 07/16/2020 and subsequent catheter angiography FINDINGS: MRI HEAD FINDINGS Brain: There is a moderate-sized acute parasagittal left frontal lobe infarct in the ACA territory with minimal petechial hemorrhage. There is a 7 mm acute right cerebellar infarct, and there are punctate acute infarcts in the left occipital lobe and right thalamus. No significant chronic white matter disease is evident. The ventricles are normal in size. No mass, midline shift, or extra-axial fluid collection is evident. Vascular: Major intracranial vascular flow voids are preserved. Skull and upper cervical spine: Unremarkable bone marrow signal. Sinuses/Orbits: Unremarkable orbits. Small left maxillary sinus mucous retention  cyst. Clear mastoid air cells. Other: None. MRA HEAD FINDINGS The study is mildly motion degraded. The visualized distal vertebral arteries are patent to the basilar with the left being mildly dominant. Patent left PICA, left AICA, and bilateral SCA origins are present. A right PICA and right AICA are not clearly identified. The basilar artery is widely patent. There are patent moderate-sized posterior communicating arteries bilaterally. Both PCAs are patent without evidence of a significant proximal stenosis. The internal carotid arteries are widely patent from skull base to carotid termini. ACAs and MCAs are patent proximally without evidence of a significant A1 or M1 stenosis. Branch vessel evaluation is limited by motion artifact including through the A2 segments, however left A2 flow appears improved from yesterday's CTA although there is still evidence of at least 1 severe focal stenosis. No aneurysm is identified. IMPRESSION: 1. Moderate-sized acute left ACA territory infarct. 2. Small acute right cerebellar, left occipital, and right thalamic infarcts. 3. Motion degraded head MRA with apparent improved left A2 segment flow compared to yesterday's CTA though with at least 1 persistent severe stenosis. Electronically Signed   By: Sebastian AcheAllen  Grady M.D.   On: 07/17/2020 12:27   DG Chest Port 1 View  Result Date: 07/17/2020 CLINICAL DATA:  Aspiration. EXAM: PORTABLE CHEST 1 VIEW COMPARISON:  None. FINDINGS: The endotracheal tube terminates approximately 4.4 cm above the carina. The lung volumes are low. Bibasilar atelectasis is noted. There is no pneumothorax. No large pleural effusion. IMPRESSION: 1. Endotracheal tube as above. 2. Low lung volumes with bibasilar atelectasis. 3. No pneumothorax or large pleural effusion. Electronically Signed   By: Katherine Mantlehristopher  Green M.D.   On: 07/17/2020 02:44   ECHOCARDIOGRAM COMPLETE  Result Date: 07/17/2020    ECHOCARDIOGRAM REPORT   Tanya Lopez Name:   Tanya Lopez Date of Exam: 07/17/2020 Medical Rec #:  [id number]                    Height:       63.0 in Accession #:    [id number]  Weight:       210.1 lb Date of Birth:  [birth year]                     BSA:          1.975 m Tanya Lopez Age:    49 years                     BP:           134/68 mmHg Tanya Lopez Gender: F                            HR:           95 bpm. Exam Location:  Inpatient Procedure: 2D Echo, Cardiac Doppler, Color Doppler and Intracardiac            Opacification Agent Indications:    Stroke  History:        Tanya Lopez has no prior history of Echocardiogram examinations.                 Risk Factors:Hypertension.  Sonographer:    Ross Ludwig RDCS (AE) Referring Phys: [phone] MCNEILL P KIRKPATRICK IMPRESSIONS  1. Small septal based LV apical filling defect seen with contrast, suspect false tendon. Apical wall motion is hyperdynamic.Marland Kitchen Left ventricular ejection fraction, by estimation, is 70 to 75%. The left ventricle has hyperdynamic function. The left ventricle has no regional wall motion abnormalities. There is moderate left ventricular hypertrophy. Left ventricular diastolic parameters are consistent with Grade I diastolic dysfunction (impaired relaxation).  2. Right ventricular systolic function is normal. The right ventricular size is normal.  3. Left atrial size was mildly dilated.  4. The mitral valve is grossly normal. Trivial mitral valve regurgitation.  5. The aortic valve is tricuspid. Aortic valve regurgitation is not visualized. Mild aortic valve stenosis. Aortic valve area, by VTI measures 1.85 cm. Aortic valve mean gradient measures 11.0 mmHg. Aortic valve Vmax measures 2.15 m/s. Conclusion(s)/Recommendation(s): No intracardiac source of embolism detected on this transthoracic study. A transesophageal echocardiogram is recommended to exclude cardiac source of embolism if clinically indicated. FINDINGS  Left Ventricle: Small septal based LV apical filling defect seen with  contrast, suspect false tendon. Apical wall motion is hyperdynamic. Left ventricular ejection fraction, by estimation, is 70 to 75%. The left ventricle has hyperdynamic function. The left ventricle has no regional wall motion abnormalities. Definity contrast agent was given IV to delineate the left ventricular endocardial borders. The left ventricular internal cavity size was normal in size. There is moderate left ventricular hypertrophy. Left ventricular diastolic parameters are consistent with Grade I diastolic dysfunction (impaired relaxation). Indeterminate filling pressures. Right Ventricle: The right ventricular size is normal. No increase in right ventricular wall thickness. Right ventricular systolic function is normal. Left Atrium: Left atrial size was mildly dilated. Right Atrium: Right atrial size was normal in size. Pericardium: There is no evidence of pericardial effusion. Mitral Valve: The mitral valve is grossly normal. Trivial mitral valve regurgitation. MV peak gradient, 5.9 mmHg. The mean mitral valve gradient is 3.0 mmHg. Tricuspid Valve: The tricuspid valve is grossly normal. Tricuspid valve regurgitation is trivial. Aortic Valve: The aortic valve is tricuspid. Aortic valve regurgitation is not visualized. Mild aortic stenosis is present. Aortic valve mean gradient measures 11.0 mmHg. Aortic valve peak gradient measures 18.5 mmHg. Aortic valve area, by VTI measures 1.85 cm. Pulmonic Valve: The pulmonic valve was grossly normal. Pulmonic valve regurgitation  is not visualized. Aorta: The aortic root and ascending aorta are structurally normal, with no evidence of dilitation. Venous: IVC assessment for right atrial pressure unable to be performed due to mechanical ventilation. IAS/Shunts: No atrial level shunt detected by color flow Doppler.  LEFT VENTRICLE PLAX 2D LVIDd:         3.60 cm  Diastology LVIDs:         2.10 cm  LV e' lateral:   8.38 cm/s LV PW:         1.30 cm  LV E/e' lateral: 11.4 LV  IVS:        1.30 cm  LV e' medial:    6.31 cm/s LVOT diam:     1.70 cm  LV E/e' medial:  15.1 LV SV:         70 LV SV Index:   35 LVOT Area:     2.27 cm  RIGHT VENTRICLE             IVC RV Basal diam:  2.50 cm     IVC diam: 2.10 cm RV S prime:     18.10 cm/s TAPSE (M-mode): 3.0 cm LEFT ATRIUM             Index       RIGHT ATRIUM           Index LA diam:        3.40 cm 1.72 cm/m  RA Area:     12.30 cm LA Vol (A2C):   56.7 ml 28.72 ml/m RA Volume:   26.90 ml  13.62 ml/m LA Vol (A4C):   74.4 ml 37.68 ml/m LA Biplane Vol: 66.9 ml 33.88 ml/m  AORTIC VALVE AV Area (Vmax):    1.96 cm AV Area (Vmean):   2.01 cm AV Area (VTI):     1.85 cm AV Vmax:           215.00 cm/s AV Vmean:          157.000 cm/s AV VTI:            0.377 m AV Peak Grad:      18.5 mmHg AV Mean Grad:      11.0 mmHg LVOT Vmax:         186.00 cm/s LVOT Vmean:        139.000 cm/s LVOT VTI:          0.307 m LVOT/AV VTI ratio: 0.81  AORTA Ao Root diam: 2.90 cm Ao Asc diam:  3.10 cm MITRAL VALVE MV Area (PHT): 2.99 cm     SHUNTS MV Peak grad:  5.9 mmHg     Systemic VTI:  0.31 m MV Mean grad:  3.0 mmHg     Systemic Diam: 1.70 cm MV Vmax:       1.21 m/s MV Vmean:      81.9 cm/s MV Decel Time: 254 msec MV E velocity: 95.50 cm/s MV A velocity: 128.00 cm/s MV E/A ratio:  0.75 Zoila Shutter MD Electronically signed by Zoila Shutter MD Signature Date/Time: 07/17/2020/3:39:13 PM    Final    CT HEAD CODE STROKE WO CONTRAST  Result Date: 07/16/2020 CLINICAL DATA:  Code stroke. Right-sided facial droop. Right-sided numbness. EXAM: CT HEAD WITHOUT CONTRAST TECHNIQUE: Contiguous axial images were obtained from the base of the skull through the vertex without intravenous contrast. COMPARISON:  None. FINDINGS: Brain: Focal subarachnoid hemorrhage is present in the medial posterior left frontal lobe. No other areas of hemorrhage are present. No  focal cortical abnormality is present. Cortex adjacent to the subarachnoid hemorrhage is obscured. Insular ribbon is  normal. Basal ganglia are normal. The brainstem and cerebellum are within normal limits. Vascular: No hyperdense vessel or unexpected calcification. Skull: Calvarium is intact. No focal lytic or blastic lesions are present. No significant extracranial soft tissue lesion is present. Sinuses/Orbits: A polyp or mucous retention cyst is present along the floor of the left maxillary sinus posteriorly. The paranasal sinuses and mastoid air cells are otherwise clear. The globes and orbits are within normal limits. ASPECTS [facility] Stroke Program Early CT Score) - Ganglionic level infarction (caudate, lentiform nuclei, internal capsule, insula, M1-M3 cortex): 7/7 - Supraganglionic infarction (M4-M6 cortex): 3/3 Total score (0-10 with 10 being normal): 10/10 IMPRESSION: 1. Focal subarachnoid hemorrhage in the medial posterior left frontal lobe. Question venous hemorrhage. Vascular malformation considered less likely. Alternatively, could represent hemorrhagic conversion of an occult infarct. 2. Otherwise normal CT appearance of the brain. 3. ASPECTS is 10/10 The above was relayed via text pager to Dr. Amada Jupiter on 07/16/2020 at 22:12 . Electronically Signed   By: Marin Roberts M.D.   On: 07/16/2020 22:17    Labs:  CBC: Recent Labs    07/16/20 2204 07/16/20 2208 07/17/20 0238 07/18/20 0522  WBC 9.5  --   --  7.6  HGB 12.1 13.6 12.9 11.3*  HCT 37.9 40.0 38.0 37.5  PLT 407*  --   --  319    COAGS: Recent Labs    07/16/20 2204  INR 0.9  APTT 30    BMP: Recent Labs    07/16/20 2204 07/16/20 2208 07/17/20 0238 07/18/20 0522  NA 137 139 136 141  K 3.9 3.5 3.6 3.6  CL 103 104  --  111  CO2 16*  --   --  21*  GLUCOSE 97 99  --  99  BUN 11 12  --  5*  CALCIUM 9.1  --   --  8.4*  CREATININE 0.83 0.80  --  0.79  GFRNONAA >60  --   --  >60  GFRAA >60  --   --  >60    LIVER FUNCTION TESTS: Recent Labs    07/16/20 2204  BILITOT 0.8  AST 34  ALT 22  ALKPHOS 66  PROT 7.7  ALBUMIN  3.7    Assessment and Plan:  Acute CVA s/p diagnostic cerebral arteriogram via right femoral approach revealing smooth areas of narrowing of left ACA A2 branches (suspicious for dissection) along with widely patent cortical veins and DV sinuses 07/17/2020 by Dr. Corliss Skains. Tanya Lopez's condition stable- awake and alert, follows simple commands, demonstrates expressive aphasia, moving left side but no movements of right side. Right groin puncture site stable, distal pulses (DPs) 2+ bilaterally. Plan for NIR follow-up with CTA head/neck (with contrast) 1 month after discharge (order placed to facilitate this, our schedulers to call Tanya Lopez to set up imaging scan). Further plans per neurology- appreciate and agree with management. Please call NIR with questions/concerns.   Electronically Signed: Elwin Mocha, PA-C 07/18/2020, 9:22 AM   I spent a total of 25 Minutes at the the Tanya Lopez's bedside AND on the Tanya Lopez's hospital floor or unit, greater than 50% of which was counseling/coordinating care for CVA s/p diagnostic cerebral arteriogram.

## 2020-07-18 NOTE — Consult Note (Addendum)
NAME:  Tanya Lopez, MRN:  [id number], DOB:  1971/10/20, LOS: 2 ADMISSION DATE:  07/16/2020, CONSULTATION DATE:  07/18/20 REFERRING MD:  Amada Jupiter, CHIEF COMPLAINT:  R-sided weakness   Brief History   49 year old female with past medical history of hypertension who presented with sudden onset right-sided weakness and aphasia that began at 2107 and was witnessed by family.  Underwent code stroke work-up in the ED, found to have small cortical SAH and possible left ACA A2 dissection.  Left intubated and PCCM consulted for vent management.  History of present illness   49 year old female who has a history of hypertension, does not appear to be on any home medications, who was apparently in her usual state of health and was in the kitchen when she began experiencing numbness in the right leg and developed trouble speaking.  This was witnessed by family who brought her immediately to the emergency department.  An initial head CT showed small cortical SAH, therefore patient was not a candidate for TPA.  There is a question of cortical vein thrombosis on CTA, therefore patient underwent cerebral angiogram which was showed smooth areas of narrowing left ACA A2 branches of A2 suspicious for dissection.  She was left intubated post procedure and PCCM consulted for ventilator management.  Past Medical History  Hypertension  Significant Hospital Events   7/24 Admit to Neurology, intubated for CTA and admit to ICU  Consults:  PCCM  Procedures:  7/25 Cerebral Angiogram>>Smooth areas of  narrowing of Lt ACA A2 and branches of A2.. suspicious for dissection  7/25 ETT  Significant Diagnostic Tests:  7/24 CT head>>Focal subarachnoid hemorrhage in the medial posterior left  frontal lobe 7/24 CTA head and neck>>Left A2 segment occlusion with poor reconstitution of distal branch vessels. Incomplete filling of posterior left frontal cortical vein could represent cortical vein  thrombosis. 7/25 MRI/MRA brain>> Moderate-sized acute left ACA territory infarct. Small acute right cerebellar, left occipital, and right thalamic infarcts. Motion degraded head MRA with apparent improved left A2 segment flow compared to yesterday's CTA though with at least 1 persistent severe stenosis. 7/25 Echo > Small septal based LV apical filling defect seen with contrast,  suspect false tendon. Apical wall motion is hyperdynamic. The left  ventricle has hyperdynamic function. The left  ventricle has no regional wall motion abnormalities. There is moderate  left ventricular hypertrophy. Left ventricular diastolic parameters are  consistent with Grade I diastolic dysfunction. No cardiac source of embolism identified.   Micro Data:  7/24 SARS-CoV-2>> negative 1/24 MRSA screen>   Antimicrobials:  Cefazolin 7/24  Interim history/subjective:  Extubated yesterday afternoon and tolerated well. Hypertriglyceridemia on clevidipine, transitioned to nicardipine.   Objective   Blood pressure (!) 155/77, pulse 93, temperature 98.5 F (36.9 C), temperature source Oral, resp. rate 14, height 5\' 3"  (1.6 m), weight (!) 95.3 kg, SpO2 96 %.    Vent Mode: CPAP;PSV FiO2 (%):  [40 %] 40 % Set Rate:  [16 bmp] 16 bmp Vt Set:  [410 mL] 410 mL PEEP:  [5 cmH20] 5 cmH20 Pressure Support:  [5 cmH20] 5 cmH20 Plateau Pressure:  [14 cmH20] 14 cmH20   Intake/Output Summary (Last 24 hours) at 07/18/2020 0959 Last data filed at 07/17/2020 2000 Gross per 24 hour  Intake 1474.5 ml  Output 200 ml  Net 1274.5 ml   Filed Weights   07/16/20 2200  Weight: (!) 95.3 kg   General: Middle aged female in bedside chair.  HEENT: [place]/AT, PERRL, no JVD Neuro: Awake, alert, minimally  verbal. Able to repeat simple phrase for Dr. Roda Shutters, which I observed. 2/5 RUE, No movement of R lower. 5/5 left sided extremities.  CV: RRR, no MRG PULM: CTA B GI: soft, non-tender, non-distended Extremities: no edema.  Skin: grossly  intact   Resolved Hospital Problem list     Assessment & Plan:   Acute CVA, L ACA A2 narrowing, suspicious for dissection with small acute cortical SAH  Intubated for neurologic airway protection after cerebral angiogram, PCCM consulted for vent management -Stroke service is primary -SBP goal liberalized to less than 180 mmhg per neurology -Will DC nicardipine and add PRN IV metoprolol -She has passed swallow eval. If SBP exceeds add home amlodipine and nebivolol.   AGMA: gap now 9 this morning, CO2 improved to 21. Likely resolved lactic acidosis (LA was 2.5 on admit) -Continue to trend BMP  Hypertension: -treatment as above.   Hyperlipidemia Hypertriglyceridemia -Transitioned off clevidipine -Statin when able to take PO -Follow up triglycerides ordered for 7/28  Assuming she is able to liberate from nicardipine infusion by this afternoon will plan to transfer her out of ICU.   Best practice:  Diet: N.p.o.  Pain/Anxiety/Delirium protocol (if indicated): Propofol, fentanyl VAP protocol (if indicated): HOB 30 degrees, suction as needed DVT prophylaxis: SCDs GI prophylaxis: Protonix Glucose control: SSI Mobility: Bedrest Code Status: Full code Family Communication: Updated by neurology Disposition: ICU  Labs   CBC: Recent Labs  Lab 07/16/20 2204 07/16/20 2208 07/17/20 0238 07/18/20 0522  WBC 9.5  --   --  7.6  NEUTROABS 4.4  --   --   --   HGB 12.1 13.6 12.9 11.3*  HCT 37.9 40.0 38.0 37.5  MCV 79.6*  --   --  80.3  PLT 407*  --   --  319    Basic Metabolic Panel: Recent Labs  Lab 07/16/20 2204 07/16/20 2208 07/17/20 0238 07/18/20 0522  NA 137 139 136 141  K 3.9 3.5 3.6 3.6  CL 103 104  --  111  CO2 16*  --   --  21*  GLUCOSE 97 99  --  99  BUN 11 12  --  5*  CREATININE 0.83 0.80  --  0.79  CALCIUM 9.1  --   --  8.4*   GFR: Estimated Creatinine Clearance: 93.5 mL/min (by C-G formula based on SCr of 0.79 mg/dL). Recent Labs  Lab  07/16/20 2204 07/17/20 0500 07/17/20 0920 07/17/20 1143 07/18/20 0522  WBC 9.5  --   --   --  7.6  LATICACIDVEN  --  2.5* 1.7 2.5*  --     Liver Function Tests: Recent Labs  Lab 07/16/20 2204  AST 34  ALT 22  ALKPHOS 66  BILITOT 0.8  PROT 7.7  ALBUMIN 3.7   No results for input(s): LIPASE, AMYLASE in the last 168 hours. No results for input(s): AMMONIA in the last 168 hours.  ABG    Component Value Date/Time   PHART 7.384 07/17/2020 0238   PCO2ART 35.4 07/17/2020 0238   PO2ART 91 07/17/2020 0238   HCO3 21.2 07/17/2020 0238   TCO2 22 07/17/2020 0238   ACIDBASEDEF 3.0 (H) 07/17/2020 0238   O2SAT 97.0 07/17/2020 0238     Coagulation Profile: Recent Labs  Lab 07/16/20 2204  INR 0.9    Cardiac Enzymes: No results for input(s): CKTOTAL, CKMB, CKMBINDEX, TROPONINI in the last 168 hours.  HbA1C: Hgb A1c MFr Bld  Date/Time Value Ref Range Status  07/18/2020 05:22 AM  5.6 4.8 - 5.6 % Final    Comment:    (NOTE) Pre diabetes:          5.7%-6.4%  Diabetes:              >6.4%  Glycemic control for   <7.0% adults with diabetes     CBG: Recent Labs  Lab 07/16/20 2202  GLUCAP 97         Joneen Roach, AGACNP-BC [facility]  See Amion for personal pager PCCM on call pager [phone]  07/18/2020 10:03 AM

## 2020-07-18 NOTE — Progress Notes (Signed)
Lower extremity venous has been completed.   Preliminary results in CV Proc.   Blanch Media 07/18/2020 11:41 AM

## 2020-07-18 NOTE — Evaluation (Addendum)
Physical Therapy Evaluation Patient Details Name: Tanya Lopez MRN: [id number] DOB: 06-Nov-1971 Today's Date: 07/18/2020   History of Present Illness  Pt presents with an acute onset of right sided weakness and inability to speak. MRI showed Moderate-sized acute left ACA territory infarct.  Small acute right cerebellar, left occipital, and right thalamic infarcts.  CTA-L A2 segment occlusion; Angiogram shows likely dissection, no cortical thrombosis.On vent--extubated 7/25  Clinical Impression  Patient presents with decreased independence with mobility due to R side weakness, decreased balance, R sided inattention, decreased activity tolerance and she will benefit from skilled PT in the acute setting. Patient previously independent and lives with family who are supportive.  Recommend follow up CIR level rehab at d/c.    Follow Up Recommendations CIR    Equipment Recommendations  Other (comment) (TBA)    Recommendations for Other Services Rehab consult     Precautions / Restrictions Precautions Precautions: Fall Precaution Comments: flaccid right side Restrictions Weight Bearing Restrictions: No      Mobility  Bed Mobility Overal bed mobility: Needs Assistance Bed Mobility: Rolling;Sidelying to Sit Rolling: Min assist Sidelying to sit: Max assist;+2 for safety/equipment       General bed mobility comments: rolled to R and assist for legs off bed and trunk upright  Transfers Overall transfer level: Needs assistance Equipment used: 2 person hand held assist Transfers: Sit to/from [employer] Sit to Stand: Mod assist Stand pivot transfers: Mod assist;+2 physical assistance       General transfer comment: A to block right knee for sit<>stand and stand pivot with A to move RLE as well for stand pivot bed to [facility], then transitioned to recliner via sit to stand with +1 A and another to move [facility] and place recliner  Ambulation/Gait              General Gait Details: NT  Stairs            Wheelchair Mobility    Modified Rankin (Stroke Patients Only) Modified Rankin (Stroke Patients Only) Pre-Morbid Rankin Score: No symptoms Modified Rankin: Severe disability     Balance Overall balance assessment: Needs assistance Sitting-balance support: Feet supported;Single extremity supported Sitting balance-Leahy Scale: Poor Sitting balance - Comments: tendency for right and posterior lateral lean with min A to correct   Standing balance support: Single extremity supported Standing balance-Leahy Scale: Zero                               Pertinent Vitals/Pain Pain Assessment: Faces Faces Pain Scale: Hurts a little bit Pain Location: RLE with PROM at hip Pain Descriptors / Indicators: Grimacing Pain Intervention(s): Monitored during session;Repositioned    Home Living Family/patient expects to be discharged to:: Private residence Living Arrangements: Spouse/significant other;Children (one in college, one in high school) Available Help at Discharge: Family;Available 24 hours/day Type of Home: House Home Access: Stairs to enter Entrance Stairs-Rails: None Entrance Stairs-Number of Steps: 3 Home Layout: One level Home Equipment: Shower seat - built in      Prior Function Level of Independence: Independent               Hand Dominance   Dominant Hand: Right    Extremity/Trunk Assessment   Upper Extremity Assessment Upper Extremity Assessment: Defer to OT evaluation RUE Deficits / Details: Flaccid, PROM WNL, no sublux noted RUE Coordination: decreased fine motor;decreased gross motor    Lower Extremity Assessment Lower Extremity Assessment: RLE  deficits/detail RLE Deficits / Details: no active movement illicited with mobility nor to command RLE Coordination: decreased fine motor;decreased gross motor       Communication   Communication: No difficulties (prior to admission, now with  aphasia)  Cognition Arousal/Alertness: Awake/alert Behavior During Therapy: Flat affect Overall Cognitive Status: Difficult to assess                                 General Comments: Did follow all one step commands      General Comments General comments (skin integrity, edema, etc.): spouse in the room, he was educated on how to assist with hand and ankle ROM on R and for positioning on R for attention    Exercises     Assessment/Plan    PT Assessment Patient needs continued PT services  PT Problem List Decreased strength;Decreased mobility;Decreased activity tolerance;Decreased balance;Decreased knowledge of use of DME;Decreased coordination;Decreased safety awareness       PT Treatment Interventions DME instruction;Therapeutic activities;Therapeutic exercise;Patient/family education;Balance training;Functional mobility training;Wheelchair mobility training;Gait training    PT Goals (Current goals can be found in the Care Plan section)  Acute Rehab PT Goals Patient Stated Goal: husband (agrees she needs rehab) PT Goal Formulation: With patient/family Time For Goal Achievement: 08/01/20 Potential to Achieve Goals: Good Additional Goals Additional Goal #1: Patient to propel w/c 100' with S using hemi technique.    Frequency Min 4X/week   Barriers to discharge        Co-evaluation PT/OT/SLP Co-Evaluation/Treatment: Yes Reason for Co-Treatment: Complexity of the patient's impairments (multi-system involvement);To address functional/ADL transfers;For patient/therapist safety PT goals addressed during session: Mobility/safety with mobility;Balance;Strengthening/ROM OT goals addressed during session: Strengthening/ROM;ADL's and self-care       AM-PAC PT "6 Clicks" Mobility  Outcome Measure Help needed turning from your back to your side while in a flat bed without using bedrails?: A Lot Help needed moving from lying on your back to sitting on the side of a  flat bed without using bedrails?: Total Help needed moving to and from a bed to a chair (including a wheelchair)?: Total Help needed standing up from a chair using your arms (e.g., wheelchair or bedside chair)?: Total Help needed to walk in hospital room?: Total Help needed climbing 3-5 steps with a railing? : Total 6 Click Score: 7    End of Session Equipment Utilized During Treatment: Gait belt Activity Tolerance: Patient tolerated treatment well Patient left: in chair;with chair alarm set;with family/visitor present;with call bell/phone within reach Nurse Communication: Mobility status PT Visit Diagnosis: Other abnormalities of gait and mobility (R26.89);Other symptoms and signs involving the nervous system (R29.898);Hemiplegia and hemiparesis Hemiplegia - Right/Left: Right Hemiplegia - dominant/non-dominant: Dominant Hemiplegia - caused by: Nontraumatic SAH;Cerebral infarction    Time: 7867-6720 PT Time Calculation (min) (ACUTE ONLY): 28 min   Charges:   PT Evaluation $PT Eval Moderate Complexity: 1 Mod          Cyndi Reygan Heagle, PT Acute Rehabilitation Services Pager:[phone] Office:[phone] 07/18/2020   Elray Mcgregor 07/18/2020, 1:59 PM

## 2020-07-18 NOTE — Progress Notes (Signed)
° ° °  [facility] has been requested to perform a transesophageal echocardiogram on [facility] for stroke.  After careful review of history and examination, the risks and benefits of transesophageal echocardiogram have been explained including risks of esophageal damage, perforation (1:10,000 risk), bleeding, pharyngeal hematoma as well as other potential complications associated with conscious sedation including aspiration, arrhythmia, respiratory failure and death. Alternatives to treatment were discussed, questions were answered. Patient is willing to proceed.   Georgie Chard, NP  07/18/2020 5:16 PM

## 2020-07-18 NOTE — Evaluation (Signed)
Speech Language Pathology Evaluation Patient Details Name: Tanya Lopez MRN: [id number] DOB: January 04, 1971 Today's Date: 07/18/2020 Time: 6301-6010 SLP Time Calculation (min) (ACUTE ONLY): 35 min  Problem List:  Patient Active Problem List   Diagnosis Date Noted  . 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 Neurologic abnormality   . Stroke (cerebrum) (HCC) 07/16/2020   Past Medical History: No past medical history on file.  HPI:  49 y.o. female with a history of hypertension who presents with weakness and aphasia that started abruptly at 21:07.  She was in the kitchen covering a dish, when she called to her daughter said that her right leg felt like it was going numb.  She subsequently developed trouble speaking.  A code stroke was activated en route and she was taken for an emergent head CT.  Unfortunately, she was not a TPA candidate due to a small cortical subarachnoid that appears acute.  A CTA was performed which demonstrates a left A2 occlusion.   Assessment / Plan / Recommendation Clinical Impression  Pt presents with expressive aphasia as pt currently nonverbal, but eventually with max cues was able to imitate single words and then 2-3 word phrase stating "I am here"; she also counted to 5 with SLP with max cues; pt augmenting communication via gestures with assistance with thumbs up for "yes" and thumbs down for "no" when asked yes/no questions; she did nod her head with mod cueing given for personal questions; pt followed 2-3 step simple commands such as "open your mouth and stick out your tongue." ; right facial droop/weakness noted at rest and with oral movements including lip pursing/retracting and protruding tongue from oral cavity.  Noted groping with attempts to speak during session and overall decreased processing of information noted.  Limited cognitive assessment able to be completed, but right inattention noted during examination  during simple 1-step commands with right extremity/inattention to right side overall.  Pt would benefit from ST for aphasia and cognitive deficits during acute stay.  Thank you for this consult.    SLP Assessment  SLP Recommendation/Assessment: Patient needs continued Speech Language Pathology Services SLP Visit Diagnosis: Dysphagia, oropharyngeal phase (R13.12);Cognitive communication deficit (R41.841)    Follow Up Recommendations  Inpatient Rehab    Frequency and Duration min 2x/week  1 week      SLP Evaluation Cognition  Overall Cognitive Status: Impaired/Different from baseline Arousal/Alertness: Awake/alert Orientation Level: Other (comment) (Pt nonverbal) Attention: Sustained Sustained Attention: Impaired Sustained Attention Impairment: Verbal basic;Functional basic Memory:  (DTA) Awareness: Impaired Safety/Judgment: Other (comment) (DTA) Comments:  (R inattention)       Comprehension  Auditory Comprehension Overall Auditory Comprehension: Impaired Yes/No Questions: Within Functional Limits Commands: Impaired Multistep Basic Commands: 25-49% accurate Conversation: Other (comment) (Pt nonverbal) EffectiveTechniques: Extra processing time Visual Recognition/Discrimination Discrimination: Not tested Reading Comprehension Reading Status: Not tested    Expression Expression Primary Mode of Expression: Nonverbal - gestures Verbal Expression Overall Verbal Expression: Impaired Initiation: Impaired Repetition: Impaired Level of Impairment: Word level Naming: Impairment Responsive: 0-25% accurate Confrontation: Impaired Convergent: 0-24% accurate Divergent: 0-24% accurate Pragmatics: Unable to assess Interfering Components: Attention Non-Verbal Means of Communication: Gestures Written Expression Dominant Hand: Right Written Expression: Not tested   Oral / Motor  Oral Motor/Sensory Function Overall Oral Motor/Sensory Function: Mild impairment Facial ROM:  Reduced right Facial Symmetry: Abnormal symmetry right Facial Strength: Within Functional Limits Facial Sensation: Reduced right Lingual ROM: Reduced right Lingual Symmetry: Abnormal symmetry right Lingual Strength: Within Functional Limits Lingual  Sensation: Within Functional Limits Velum: Within Functional Limits Mandible: Within Functional Limits Motor Speech Overall Motor Speech: Impaired Respiration: Within functional limits Articulation: Impaired Level of Impairment: Word Intelligibility: Unable to assess (comment) Motor Planning: Not tested Motor Speech Errors: Not applicable                       Tressie Stalker, M.S., CCC-SLP 07/18/2020, 3:18 PM

## 2020-07-18 NOTE — Evaluation (Signed)
Clinical/Bedside Swallow Evaluation Patient Details  Name: Tanya Lopez MRN: [id number] Date of Birth: 06-22-1971  Today's Date: 07/18/2020 Time: SLP Start Time (ACUTE ONLY): 0955 SLP Stop Time (ACUTE ONLY): 1030 SLP Time Calculation (min) (ACUTE ONLY): 35 min  Past Medical History: No past medical history on file. HPI:  49 y.o. female with a history of hypertension who presents with weakness and aphasia that started abruptly at 21:07 on 07/17/20.  She was in the kitchen covering a dish, when she called to her daughter said that her right leg felt like it was going numb.  She subsequently developed trouble speaking.  A code stroke was activated en route and she was taken for an emergent head CT.  Unfortunately, she was not a TPA candidate due to a small cortical subarachnoid that appears acute.  A CTA was performed which demonstrates a left A2 occlusion. BSE/SLE generated.  Assessment / Plan / Recommendation Clinical Impression  BSE completed with pt with husband at bedside.  Various consistencies administered including: ice chips, thin (various volumes), puree and soft solids with minimal right oral weakness noted and tongue sweep right initiated to clear right buccal cavity; no overt s/s of aspiration noted with any consistency during evaluation, but pt did exhibit decreased sustained attention which may impact meal intake and put pt at a greater risk for aspiration; recommend initiating a regular/thin liquid diet with aspiration precautions in place and FULL supervision initially d/t inattention.  Pt consistently took smaller bites/sips with min-mod verbal cues during evaluation, but environmental distractions did impact intake without redirection given during BSE.  ST will f/u for dysphagia/aphasia during acute stay.  Thank you for this consult. SLP Visit Diagnosis: Dysphagia, oropharyngeal phase (R13.12)    Aspiration Risk  Mild aspiration risk    Diet Recommendation    Regular/thin liquids  Medication Administration: Whole meds with liquid    Other  Recommendations Oral Care Recommendations: Oral care BID   Follow up Recommendations Inpatient Rehab      Frequency and Duration min 2x/week  1 week       Prognosis Prognosis for Safe Diet Advancement: Good Barriers to Reach Goals: Cognitive deficits      Swallow Study   General Date of Onset: 07/16/20 HPI: 49 y.o. female with a history of hypertension who presents with weakness and aphasia that started abruptly at 21:07.  She was in the kitchen covering a dish, when she called to her daughter said that her right leg felt like it was going numb.  She subsequently developed trouble speaking.  A code stroke was activated en route and she was taken for an emergent head CT.  Unfortunately, she was not a TPA candidate due to a small cortical subarachnoid that appears acute.  A CTA was performed which demonstrates a left A2 occlusion. Type of Study: Bedside Swallow Evaluation Previous Swallow Assessment:  (n/a; [facility] not performed per nursing pending BSE order) Diet Prior to this Study: NPO Temperature Spikes Noted: No Respiratory Status: Room air History of Recent Intubation: Yes Length of Intubations (days):  (1) Date extubated: 07/17/20 Behavior/Cognition: Alert;Cooperative;Requires cueing;Distractible Oral Cavity Assessment: Within Functional Limits Oral Care Completed by SLP: Yes Oral Cavity - Dentition: Adequate natural dentition Vision: Functional for self-feeding Self-Feeding Abilities: Able to feed self;Needs assist Patient Positioning: Upright in chair Baseline Vocal Quality: Not observed Volitional Cough: Weak Volitional Swallow: Able to elicit    Oral/Motor/Sensory Function Overall Oral Motor/Sensory Function: Mild impairment Facial ROM: Reduced right Facial Symmetry: Abnormal symmetry right Facial  Strength: Within Functional Limits Facial Sensation: Reduced right Lingual ROM: Reduced  right Lingual Symmetry: Abnormal symmetry right Lingual Strength: Within Functional Limits Lingual Sensation: Within Functional Limits Velum: Within Functional Limits Mandible: Within Functional Limits   Ice Chips Ice chips: Within functional limits Presentation: Spoon   Thin Liquid Thin Liquid: Within functional limits Presentation: Cup;Straw    Nectar Thick Nectar Thick Liquid: Not tested   Honey Thick Honey Thick Liquid: Not tested   Puree Puree: Within functional limits Presentation: Spoon   Solid     Solid: Within functional limits Presentation: Self Fed      Tressie Stalker, M.S., CCC-SLP 07/18/2020,3:02 PM

## 2020-07-18 NOTE — Progress Notes (Signed)
STROKE TEAM PROGRESS NOTE   INTERVAL HISTORY Husband Dr. Selena Batten is at bedside. Pt sitting in chair, is working with speech therapist. She is able to repeat " I am here" but not other sentences. Still expressive aphasia, largely nonverbal. 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 Still on cardene.    OBJECTIVE Vitals:   07/18/20 0630 07/18/20 0645 07/18/20 0700 07/18/20 0800  BP: (!) 148/78 (!) 146/77 (!) 155/77   Pulse: 95 91 93   Resp: 20 17 14    Temp:    98.5 F (36.9 C)  TempSrc:    Oral  SpO2: 98% 98% 96%   Weight:      Height:       CBC:  Recent Labs  Lab 07/16/20 2204 07/16/20 2208 07/17/20 0238 07/18/20 0522  WBC 9.5  --   --  7.6  NEUTROABS 4.4  --   --   --   HGB 12.1   < > 12.9 11.3*  HCT 37.9   < > 38.0 37.5  MCV 79.6*  --   --  80.3  PLT 407*  --   --  319   < > = values in this interval not displayed.   Basic Metabolic Panel:  Recent Labs  Lab 07/16/20 2204 07/16/20 2204 07/16/20 2208 07/16/20 2208 07/17/20 0238 07/18/20 0522  NA 137   < > 139   < > 136 141  K 3.9   < > 3.5   < > 3.6 3.6  CL 103   < > 104  --   --  111  CO2 16*  --   --   --   --  21*  GLUCOSE 97   < > 99  --   --  99  BUN 11   < > 12  --   --  5*  CREATININE 0.83   < > 0.80  --   --  0.79  CALCIUM 9.1  --   --   --   --  8.4*   < > = values in this interval not displayed.   Lipid Panel:     Component Value Date/Time   CHOL 222 (H) 07/18/2020 0522   TRIG 492 (H) 07/18/2020 0522   HDL 37 (L) 07/18/2020 0522   CHOLHDL 6.0 07/18/2020 0522   VLDL UNABLE TO CALCULATE IF TRIGLYCERIDE OVER 400 mg/dL 07/20/2020 16/09/9603   LDLCALC UNABLE TO CALCULATE IF TRIGLYCERIDE OVER 400 mg/dL 5409 81/19/1478   2956:  Lab Results  Component Value Date   HGBA1C 5.6 07/18/2020   Urine Drug Screen: No results found for: LABOPIA, COCAINSCRNUR, LABBENZ, AMPHETMU, THCU, LABBARB  Alcohol Level No results found for: [facility]  IMAGING  CT HEAD CODE STROKE WO CONTRAST 07/16/2020 1.  Focal subarachnoid hemorrhage in the medial posterior left frontal lobe. Question venous hemorrhage. Vascular malformation considered less likely. Alternatively, could represent hemorrhagic conversion of an occult infarct.  2. Otherwise normal CT appearance of the brain.  3. ASPECTS is 10/10   CT Code Stroke CTA Head W/WO contrast CT Code Stroke CTA Neck W/WO contrast 07/16/2020 1. 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  2. Incomplete filling of posterior left frontal cortical vein could represent cortical vein thrombosis. Cerebral arteriogram would be useful for further evaluation of both possibilities.  3. Mild tortuosity of cervical vessels without significant cervical stenosis. This is most commonly seen in the setting of chronic hypertension.   Neuro Interventional Radiology -  Cerebral Angiogram  07/17/2020 S/P 4 vessel cerebral arteriogram - RT CFA approach. 1. Smooth areas of  narrowing of Lt ACA A2 and branches of A2 suspicious for dissection, less likely ICAD v vasospasm. No intraluminal filling defects noted . 2. Cortical veins and DVsinuses widely patent.  MR BRAIN WO CONTRAST MR ANGIO HEAD WO CONTRAST 07/17/2020 1. Moderate-sized acute left ACA territory infarct. 2. Small acute right cerebellar, left occipital, and right thalamic infarcts. 3. Motion degraded head MRA with apparent improved left A2 segment flow compared to yesterday's CTA though with at least 1 persistent severe stenosis.   DG Chest Port 1 View 07/17/2020 1. Endotracheal tube as above.  2. Low lung volumes with bibasilar atelectasis.  3. No pneumothorax or large pleural effusion.   ECHOCARDIOGRAM COMPLETE 07/17/2020 1. Small septal based LV apical filling defect seen with contrast, suspect false tendon. Apical wall motion is hyperdynamic.Marland Kitchen Left ventricular ejection fraction, by estimation, is 70 to 75%. The left ventricle has hyperdynamic function. The left ventricle has no  regional wall motion abnormalities. There is moderate left ventricular hypertrophy. Left ventricular diastolic parameters are consistent with Grade I diastolic dysfunction (impaired relaxation).  2. Right ventricular systolic function is normal. The right ventricular size is normal.  3. Left atrial size was mildly dilated.  4. The mitral valve is grossly normal. Trivial mitral valve regurgitation.  5. The aortic valve is tricuspid. Aortic valve regurgitation is not visualized. Mild aortic valve stenosis. Aortic valve area, by VTI measures 1.85 cm. Aortic valve mean gradient measures 11.0 mmHg. Aortic valve Vmax measures 2.15 m/s. Conclusion(s)/Recommendation(s): No intracardiac source of embolism detected on this transthoracic study. A transesophageal echocardiogram is recommended to exclude cardiac source of embolism if clinically indicated.   ECG - SR rate 83 BPM. (See cardiology reading for complete details)  PHYSICAL EXAM   Temp:  [98.5 F (36.9 C)-100.9 F (38.3 C)] 98.5 F (36.9 C) (07/26 0800) Pulse Rate:  [81-110] 93 (07/26 0700) Resp:  [13-26] 14 (07/26 0700) BP: (121-160)/(66-88) 155/77 (07/26 0700) SpO2:  [94 %-100 %] 96 % (07/26 0700) Arterial Line BP: (142-158)/(72-84) 142/76 (07/25 1030) FiO2 (%):  [40 %] 40 % (07/25 1209)  General - Well nourished, well developed, not in acute distress  Ophthalmologic - fundi not visualized due to noncooperation.  Cardiovascular - Regular rate and rhythm.  Neuro - awake alert, eyes open, following 3-step commands. Eyes in mid position, no gaze palsy, visual field full, able to track bilaterally, PERRL. Mild right facial asymmetry, tongue midline.  Left UE at least 4/5 and LLE at least 3/5, however, no movement of right UE and LE. DTR 1+ and no babinski. Sensation symmetrical subjectively, coordination intact LUE and gait not tested.   ASSESSMENT/PLAN [name] is a 49 y.o. female with history of hypertension who  presents with right sided weakness and aphasia. She did not receive IV t-PA due to a small cortical subarachnoid that appeared acute. CTA -> left A2 occlusion. Cerebral angiogram -> narrowing of Lt ACA A2 and branches of A2 suspicious for dissection.  Stroke: left ACA infarcts and also right cerebellar and left temporoparietal small infarct with focal small SAH in the left frontal parafalcine with left A2 segment stenosis, etiology unclear, possible ACA dissection vs. cardioemoblic source  CT Head - Focal subarachnoid hemorrhage in the medial posterior left frontal lobe.  ASPECTS is 10/10   CTA H&N - Left A2 segment occlusion with poor reconstitution of distal branch vessels. Incomplete filling of posterior left frontal  cortical vein could represent cortical vein thrombosis.  Cerebral Angiogram - Smooth areas of  narrowing of Lt ACA A2 and branches of A2 suspicious for dissection, less likely ICAD v vasospasm. No intraluminal filling defects noted. No cortical venous thrombosis  MRI head - moderate L ACA territory infarct. Small R cerebellar, L occipital, R thalamic infarcts.   MRA head - motion. Improved L A2 flow w/ at least 1 severe stenosis   2D Echo - EF 70-75%. No source of embolus. LA mildy dilated  LE doppler pending   TEE pending   Loyal Jacobson Virus 2 - negative  Direct LDL - 87.5  HgbA1c - 5.6  Hypercoagulable labs pending   VTE prophylaxis - Heparin 5000 units sq tid   No antithrombotic prior to admission, now on aspirin 325 po daily. Will start DAPT in 3 days.    Ongoing aggressive stroke risk factor management  Therapy recommendations:  pending  Disposition:  Pending  Follow-up Dr. Corliss Skains. Repeat CTA head and neck in 1 month as an OP  Respiratory failure  Intubated for IR procedure  Remain intubated post op for airway protection  Extubation 7/25  CCM on board  Tolerated well  Hypertension  Home BP meds: norvasc 10, bysostolic 5  Treated w/  BP  meds: cardene   Stable  Wean off cardene as able . SBP goal < 180 . Long-term BP goal normotensive  Hyperlipidemia  Home Lipid lowering medication: none listed  TG 1600->1549->492 - now off cleviprex and propofol  TC 302->222  Direct LDL - 87.5, goal < 70  Current lipid lowering medication: lipitor 40  Continue statin at discharge  Urinary retention  Has I&O 3 times now  Bladder scan  Foley catheter if needed  Other Stroke Risk Factors  Obesity, Body mass index is 37.22 kg/m., recommend weight loss, diet and exercise as appropriate   Other Active Problems  Code status - Full Code  Mildly elevated lactic acid - Unclear etiology - monitoring  Hospital day # 2  Patient continues to be critically ill for the last 24 hours, continues to have elevated BP needing IV BP meds and constant BP monitoring, and I have ordered BP IV meds PRN to wean off cardene. Pt also developed urinary retention and I ordered bladder scan and foley if needed. I discussed with CCM NP Renae Fickle.    This patient is critically ill due to [facility], left ACA occlusion s/p IR, right hemiplegia, hypertensive emergency and at significant risk of neurological worsening, death form recurrent stroke, SAH worsening, hemorrhagic conversion, seizure, hypertensive encephalopathy. This patient's care requires constant monitoring of vital signs, hemodynamics, respiratory and cardiac monitoring, review of multiple databases, neurological assessment, discussion with family, other specialists and medical decision making of high complexity. I spent 35 minutes of neurocritical care time in the care of this patient. I had long discussion with husband at bedside, updated pt current condition, treatment plan and potential prognosis, and answered all the questions. He expressed understanding and appreciation.    Marvel Plan, MD PhD Stroke Neurology 07/18/2020 8:47 AM   To contact Stroke Continuity provider, please refer to  [email or web address]. After hours, contact General Neurology

## 2020-07-18 NOTE — Progress Notes (Signed)
PCCM INTERVAL PROGRESS NOTE   Patient has remained off anti-hypertensive infusions and has required no PRN's to maintain SBP goal.  She has tolerated extubation very well Ate lunch with help from her husband. Tolerated well.   Dr. Roda Shutters has placed order to transfer to telemetry PCCM will sign off as of 7/27. Please re-consult if needed.     Joneen Roach, AGACNP-BC [facility]  See Amion for personal pager PCCM on call pager [phone]  07/18/2020 3:59 PM

## 2020-07-19 ENCOUNTER — Encounter (HOSPITAL_COMMUNITY)
Admission: EM | Disposition: A | Discharge status: Rehabilitation Facility | Payer: Self-pay | Source: Home / Self Care | Attending: Neurology

## 2020-07-19 ENCOUNTER — Encounter (HOSPITAL_COMMUNITY): Payer: Self-pay | Admitting: Neurology

## 2020-07-19 ENCOUNTER — Inpatient Hospital Stay (HOSPITAL_COMMUNITY): Payer: BC Managed Care – PPO | Admitting: Anesthesiology

## 2020-07-19 ENCOUNTER — Inpatient Hospital Stay (HOSPITAL_COMMUNITY): Payer: BC Managed Care – PPO

## 2020-07-19 DIAGNOSIS — I639 Cerebral infarction, unspecified: Secondary | ICD-10-CM

## 2020-07-19 DIAGNOSIS — I10 Essential (primary) hypertension: Secondary | ICD-10-CM

## 2020-07-19 DIAGNOSIS — Q2112 Patent foramen ovale: Secondary | ICD-10-CM

## 2020-07-19 DIAGNOSIS — I63422 Cerebral infarction due to embolism of left anterior cerebral artery: Secondary | ICD-10-CM

## 2020-07-19 DIAGNOSIS — I63522 Cerebral infarction due to unspecified occlusion or stenosis of left anterior cerebral artery: Principal | ICD-10-CM

## 2020-07-19 DIAGNOSIS — Q211 Atrial septal defect: Secondary | ICD-10-CM

## 2020-07-19 HISTORY — DX: Patent foramen ovale: Q21.12

## 2020-07-19 HISTORY — DX: Atrial septal defect: Q21.1

## 2020-07-19 HISTORY — PX: TEE WITHOUT CARDIOVERSION: SHX5443

## 2020-07-19 LAB — CBC
HCT: 36.8 % (ref 36.0–46.0)
Hemoglobin: 11.1 g/dL — ABNORMAL LOW (ref 12.0–15.0)
MCH: 24.3 pg — ABNORMAL LOW (ref 26.0–34.0)
MCHC: 30.2 g/dL (ref 30.0–36.0)
MCV: 80.7 fL (ref 80.0–100.0)
Platelets: 363 10*3/uL (ref 150–400)
RBC: 4.56 MIL/uL (ref 3.87–5.11)
RDW: 20.6 % — ABNORMAL HIGH (ref 11.5–15.5)
WBC: 6.7 10*3/uL (ref 4.0–10.5)
nRBC: 0 % (ref 0.0–0.2)

## 2020-07-19 LAB — BASIC METABOLIC PANEL
Anion gap: 10 (ref 5–15)
BUN: 5 mg/dL — ABNORMAL LOW (ref 6–20)
CO2: 23 mmol/L (ref 22–32)
Calcium: 8.6 mg/dL — ABNORMAL LOW (ref 8.9–10.3)
Chloride: 108 mmol/L (ref 98–111)
Creatinine, Ser: 0.67 mg/dL (ref 0.44–1.00)
GFR calc Af Amer: >60 mL/min (ref >60)
GFR calc non Af Amer: >60 mL/min (ref >60)
Glucose, Bld: 103 mg/dL — ABNORMAL HIGH (ref 70–99)
Potassium: 3.2 mmol/L — ABNORMAL LOW (ref 3.5–5.1)
Sodium: 141 mmol/L (ref 135–145)

## 2020-07-19 LAB — TRIGLYCERIDES: Triglycerides: 361 mg/dL — ABNORMAL HIGH (ref <150)

## 2020-07-19 LAB — LIPID PANEL
Cholesterol: 239 mg/dL — ABNORMAL HIGH (ref 0–200)
HDL: 43 mg/dL (ref >40)
LDL Cholesterol: 124 mg/dL — ABNORMAL HIGH (ref 0–99)
Total CHOL/HDL Ratio: 5.6 RATIO
Triglycerides: 362 mg/dL — ABNORMAL HIGH (ref <150)
VLDL: 72 mg/dL — ABNORMAL HIGH (ref 0–40)

## 2020-07-19 SURGERY — ECHOCARDIOGRAM, TRANSESOPHAGEAL
Anesthesia: General

## 2020-07-19 MED ORDER — POTASSIUM CHLORIDE CRYS ER 20 MEQ PO TBCR
20.0000 meq | EXTENDED_RELEASE_TABLET | ORAL | Status: AC
  Administered 2020-07-19 (×2): 20 meq via ORAL
  Filled 2020-07-19 (×2): qty 1

## 2020-07-19 MED ORDER — PROPOFOL 10 MG/ML IV BOLUS
INTRAVENOUS | Status: DC | PRN
  Administered 2020-07-19: 20 mg via INTRAVENOUS

## 2020-07-19 MED ORDER — PROPOFOL 500 MG/50ML IV EMUL
INTRAVENOUS | Status: DC | PRN
  Administered 2020-07-19: 100 ug/kg/min via INTRAVENOUS

## 2020-07-19 MED ORDER — NEBIVOLOL HCL 5 MG PO TABS
5.0000 mg | ORAL_TABLET | Freq: Every day | ORAL | Status: DC
  Administered 2020-07-19 – 2020-07-20 (×2): 5 mg via ORAL
  Filled 2020-07-19 (×2): qty 1

## 2020-07-19 MED ORDER — ATORVASTATIN CALCIUM 80 MG PO TABS
80.0000 mg | ORAL_TABLET | Freq: Every day | ORAL | Status: DC
  Administered 2020-07-20: 80 mg via ORAL
  Filled 2020-07-19: qty 1

## 2020-07-19 MED ORDER — AMLODIPINE BESYLATE 10 MG PO TABS
10.0000 mg | ORAL_TABLET | Freq: Every day | ORAL | Status: DC
  Administered 2020-07-20: 10 mg via ORAL
  Filled 2020-07-19: qty 1

## 2020-07-19 NOTE — CV Procedure (Signed)
Brief TEE Note  LVEF >55% Mild MR.  Trivial TR and AR No LA/LAA thrombus or mass +PFO by saline microcavitation study.  There was right to left flow at rest.  No ASD.  Will order lower extremity Dopplers.   Please see full report for additional details.   [name] C. Duke Salvia, MD, [facility] 07/19/2020 11:47 AM

## 2020-07-19 NOTE — Consult Note (Signed)
Physical Medicine and Rehabilitation Consult Reason for Consult: Right side weakness and inability to speak Referring Physician: Dr.Xu   HPI: Tanya Lopez is a 49 y.o. right-handed female with history of hypertension.  Per chart review patient lives with spouse and children.  1 level home 3 steps to entry.  She has 1 child in college 1 in high school.  Reportedly independent prior to admission.  Presented 07/16/2020 with acute onset of left-sided weakness and aphasia.  Cranial CT scan showed focal subarachnoid hemorrhage in the medial posterior left frontal lobe.  CT angiogram of head and neck showed left A2 segment occlusion with poor reconstitution of distal branch vessels.  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  Patient did not receive TPA.  Admission chemistries unremarkable, urinalysis negative nitrite, lactic acid 2.5.  Cerebral angiogram completed showing smooth areas of narrowing of left ACA A2 and branches of A2 suspicious for possible dissection but no cortical thrombosis.  Plan currently is to follow-up with interventional radiology 1 month for repeat CTA of head and neck.  Patient did initially require ventilatory support and extubated 07/17/2020.  Lower extremity Dopplers negative for DVT.  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  Small acute right cerebellar left occipital and right thalamic infarct.  Motion degraded head MRA with apparent improved left A2 segment flow compared to prior CTA.  Maintained on Cardene for blood pressure control.  Echocardiogram with ejection fraction of 75% no wall motion abnormalities.  Awaiting TEE.  Patient currently maintained on aspirin 325 mg daily for CVA prophylaxis.  Subcutaneous heparin for DVT prophylaxis.  Therapy evaluations completed with recommendations of physical medicine rehab consult.   Review of Systems  Constitutional: Negative  for chills and fever.  HENT: Negative for hearing loss.   Eyes: Negative for blurred vision and double vision.  Respiratory: Negative for cough and shortness of breath.   Cardiovascular: Negative for chest pain, palpitations and leg swelling.  Gastrointestinal: Positive for constipation. Negative for heartburn, nausea and vomiting.  Genitourinary: Negative for dysuria, flank pain and hematuria.  Musculoskeletal: Positive for myalgias.  Skin: Negative for rash.  Neurological: Positive for sensory change, speech change and focal weakness.  All other systems reviewed and are negative.  PMH/PSH: HTN  FH: Unable to obtain du to expressive aphasia  Social History:  has no history on file for tobacco use, alcohol use, and drug use.   Allergies: No Known Allergies Medications Prior to Admission  Medication Sig Dispense Refill  . amLODipine (NORVASC) 10 MG tablet Take 10 mg by mouth daily.    . nebivolol (BYSTOLIC) 5 MG tablet Take 5 mg by mouth daily.      Home: Home Living Family/patient expects to be discharged to:: Private residence Living Arrangements: Spouse/significant other Available Help at Discharge: Family, Available 24 hours/day Type of Home: House Home Access: Stairs to enter Secretary/administratorntrance Stairs-Number of Steps: 3 Entrance Stairs-Rails: None Home Layout: One level Bathroom Shower/Tub: Psychologist, counsellingWalk-in shower, Sport and exercise psychologistDoor Bathroom Toilet: Standard Home Equipment: Information systems managerhower seat - built in  Lives With: Spouse, Family  Functional History: Prior Function Level of Independence: Independent Functional Status:  Mobility: Bed Mobility Overal bed mobility: Needs Assistance Bed Mobility: Rolling, Sidelying to Sit Rolling: Min assist Sidelying to sit: Max assist, +2 for safety/equipment General bed mobility comments: rolled to R and assist for legs off bed and trunk upright Transfers Overall transfer level: Needs assistance Equipment used: 2 person hand held assist  Transfers: Sit to/from  Stand, [employer] Transfers Sit to Stand: Mod assist Stand pivot transfers: Mod assist, +2 physical assistance General transfer comment: A to block right knee for sit<>stand and stand pivot with A to move RLE as well for stand pivot bed to [facility], then transitioned to recliner via sit to stand with +1 A and another to move [facility] and place recliner Ambulation/Gait General Gait Details: NT    ADL: ADL Overall ADL's : Needs assistance/impaired Eating/Feeding: Maximal assistance Eating/Feeding Details (indicate cue type and reason): supported sitting Grooming: Maximal assistance Grooming Details (indicate cue type and reason): supported sitting Upper Body Bathing: Maximal assistance Upper Body Bathing Details (indicate cue type and reason): supported sitting Lower Body Bathing: Maximal assistance Lower Body Bathing Details (indicate cue type and reason): Mod A sit<>stand and maintain standing with right knee blocked Upper Body Dressing : Maximal assistance Upper Body Dressing Details (indicate cue type and reason): supported sitting Lower Body Dressing: Total assistance Lower Body Dressing Details (indicate cue type and reason): Mod A sit<>stand and maintain standing with right knee blocked Toilet Transfer: Moderate assistance, +2 for physical assistance, Stand-pivot, BSC Toilet Transfer Details (indicate cue type and reason): right knee blocked and A to move RLE Toileting- Clothing Manipulation and Hygiene: Total assistance Toileting - Clothing Manipulation Details (indicate cue type and reason): Mod A sit<>stand and maintain standing with right knee blocked  Cognition: Cognition Overall Cognitive Status: Impaired/Different from baseline Arousal/Alertness: Awake/alert Orientation Level: Oriented to person Attention: Sustained Sustained Attention: Impaired Sustained Attention Impairment: Verbal basic, Functional basic Memory:  (DTA) Awareness: Impaired Safety/Judgment: Other (comment)  (DTA) Comments:  (R inattention) Cognition Arousal/Alertness: Awake/alert Behavior During Therapy: Flat affect Overall Cognitive Status: Impaired/Different from baseline General Comments: Did follow all one step commands Difficult to assess due to: Impaired communication (aphasia)  Blood pressure (!) 167/110, pulse 96, temperature 99.2 F (37.3 C), temperature source Oral, resp. rate 21, height 5\' 3"  (1.6 m), weight (!) 98.1 kg, SpO2 96 %. Physical Exam General:  No apparent distress HEENT: Head is normocephalic, atraumatic, PERRLA, EOMI, sclera anicteric, oral mucosa pink and moist, dentition intact, ext ear canals clear,  Neck: Supple without JVD or lymphadenopathy Heart: Reg rate and rhythm. No murmurs rubs or gallops Chest: CTA bilaterally without wheezes, rales, or rhonchi; no distress Abdomen: Soft, non-tender, non-distended, bowel sounds positive. Extremities: No clubbing, cyanosis, or edema. Pulses are 2+ Skin: Clean and intact without signs of breakdown Neuro: Patient is alert.  Expressive aphasia and largely nonverbal.  Follows simple demonstrated commands. Dense right sided hemiplegia. LUE 4/5 and LLE 4-/5 Psych: Pt's affect is appropriate. Pt is cooperative  Results for orders placed or performed during the hospital encounter of 07/16/20 (from the past 24 hour(s))  CBC     Status: Abnormal   Collection Time: 07/19/20  3:00 AM  Result Value Ref Range   WBC 6.7 4.0 - 10.5 K/uL   RBC 4.56 3.87 - 5.11 MIL/uL   Hemoglobin 11.1 (L) 12.0 - 15.0 g/dL   HCT 07/21/20 36 - 46 %   MCV 80.7 80.0 - 100.0 fL   MCH 24.3 (L) 26.0 - 34.0 pg   MCHC 30.2 30.0 - 36.0 g/dL   RDW 28.4 (H) 13.2 - 44.0 %   Platelets 363 150 - 400 K/uL   nRBC 0.0 0.0 - 0.2 %  Basic metabolic panel     Status: Abnormal   Collection Time: 07/19/20  3:00 AM  Result Value Ref Range   Sodium 141  135 - 145 mmol/L   Potassium 3.2 (L) 3.5 - 5.1 mmol/L   Chloride 108 98 - 111 mmol/L   CO2 23 22 - 32 mmol/L    Glucose, Bld 103 (H) 70 - 99 mg/dL   BUN 5 (L) 6 - 20 mg/dL   Creatinine, Ser 1.61 0.44 - 1.00 mg/dL   Calcium 8.6 (L) 8.9 - 10.3 mg/dL   GFR calc non Af Amer >60 >60 mL/min   GFR calc Af Amer >60 >60 mL/min   Anion gap 10 5 - 15  Triglycerides     Status: Abnormal   Collection Time: 07/19/20  3:00 AM  Result Value Ref Range   Triglycerides 361 (H) <150 mg/dL   MR ANGIO HEAD WO CONTRAST  Result Date: 07/17/2020 CLINICAL DATA:  Stroke follow-up. Acute right-sided weakness and aphasia. Focal medial posterior left frontal subarachnoid hemorrhage on CT with left A2 occlusion on CTA and possible dissection on catheter angiography. EXAM: MRI HEAD WITHOUT CONTRAST MRA HEAD WITHOUT CONTRAST TECHNIQUE: Multiplanar, multiecho pulse sequences of the brain and surrounding structures were obtained without intravenous contrast. Angiographic images of the head were obtained using MRA technique without contrast. COMPARISON:  Head CT and CTA 07/16/2020 and subsequent catheter angiography FINDINGS: MRI HEAD FINDINGS Brain: There is a moderate-sized acute parasagittal left frontal lobe infarct in the ACA territory with minimal petechial hemorrhage. There is a 7 mm acute right cerebellar infarct, and there are punctate acute infarcts in the left occipital lobe and right thalamus. No significant chronic white matter disease is evident. The ventricles are normal in size. No mass, midline shift, or extra-axial fluid collection is evident. Vascular: Major intracranial vascular flow voids are preserved. Skull and upper cervical spine: Unremarkable bone marrow signal. Sinuses/Orbits: Unremarkable orbits. Small left maxillary sinus mucous retention cyst. Clear mastoid air cells. Other: None. MRA HEAD FINDINGS The study is mildly motion degraded. The visualized distal vertebral arteries are patent to the basilar with the left being mildly dominant. Patent left PICA, left AICA, and bilateral SCA origins are present. A right PICA  and right AICA are not clearly identified. The basilar artery is widely patent. There are patent moderate-sized posterior communicating arteries bilaterally. Both PCAs are patent without evidence of a significant proximal stenosis. The internal carotid arteries are widely patent from skull base to carotid termini. ACAs and MCAs are patent proximally without evidence of a significant A1 or M1 stenosis. Branch vessel evaluation is limited by motion artifact including through the A2 segments, however left A2 flow appears improved from yesterday's CTA although there is still evidence of at least 1 severe focal stenosis. No aneurysm is identified. IMPRESSION: 1. Moderate-sized acute left ACA territory infarct. 2. Small acute right cerebellar, left occipital, and right thalamic infarcts. 3. Motion degraded head MRA with apparent improved left A2 segment flow compared to yesterday's CTA though with at least 1 persistent severe stenosis. Electronically Signed   By: Sebastian Ache M.D.   On: 07/17/2020 12:27   MR BRAIN WO CONTRAST  Result Date: 07/17/2020 CLINICAL DATA:  Stroke follow-up. Acute right-sided weakness and aphasia. Focal medial posterior left frontal subarachnoid hemorrhage on CT with left A2 occlusion on CTA and possible dissection on catheter angiography. EXAM: MRI HEAD WITHOUT CONTRAST MRA HEAD WITHOUT CONTRAST TECHNIQUE: Multiplanar, multiecho pulse sequences of the brain and surrounding structures were obtained without intravenous contrast. Angiographic images of the head were obtained using MRA technique without contrast. COMPARISON:  Head CT and CTA 07/16/2020 and subsequent catheter angiography FINDINGS:  MRI HEAD FINDINGS Brain: There is a moderate-sized acute parasagittal left frontal lobe infarct in the ACA territory with minimal petechial hemorrhage. There is a 7 mm acute right cerebellar infarct, and there are punctate acute infarcts in the left occipital lobe and right thalamus. No significant  chronic white matter disease is evident. The ventricles are normal in size. No mass, midline shift, or extra-axial fluid collection is evident. Vascular: Major intracranial vascular flow voids are preserved. Skull and upper cervical spine: Unremarkable bone marrow signal. Sinuses/Orbits: Unremarkable orbits. Small left maxillary sinus mucous retention cyst. Clear mastoid air cells. Other: None. MRA HEAD FINDINGS The study is mildly motion degraded. The visualized distal vertebral arteries are patent to the basilar with the left being mildly dominant. Patent left PICA, left AICA, and bilateral SCA origins are present. A right PICA and right AICA are not clearly identified. The basilar artery is widely patent. There are patent moderate-sized posterior communicating arteries bilaterally. Both PCAs are patent without evidence of a significant proximal stenosis. The internal carotid arteries are widely patent from skull base to carotid termini. ACAs and MCAs are patent proximally without evidence of a significant A1 or M1 stenosis. Branch vessel evaluation is limited by motion artifact including through the A2 segments, however left A2 flow appears improved from yesterday's CTA although there is still evidence of at least 1 severe focal stenosis. No aneurysm is identified. IMPRESSION: 1. Moderate-sized acute left ACA territory infarct. 2. Small acute right cerebellar, left occipital, and right thalamic infarcts. 3. Motion degraded head MRA with apparent improved left A2 segment flow compared to yesterday's CTA though with at least 1 persistent severe stenosis. Electronically Signed   By: Sebastian Ache M.D.   On: 07/17/2020 12:27   ECHOCARDIOGRAM COMPLETE  Result Date: 07/17/2020    ECHOCARDIOGRAM REPORT   Patient Name:   Tanya Lopez Date of Exam: 07/17/2020 Medical Rec #:  [id number]                    Height:       63.0 in Accession #:    [id number]                   Weight:       210.1 lb Date of  Birth:  [birth year]                     BSA:          1.975 m Patient Age:    49 years                     BP:           134/68 mmHg Patient Gender: F                            HR:           95 bpm. Exam Location:  Inpatient Procedure: 2D Echo, Cardiac Doppler, Color Doppler and Intracardiac            Opacification Agent Indications:    Stroke  History:        Patient has no prior history of Echocardiogram examinations.                 Risk Factors:Hypertension.  Sonographer:    Ross Ludwig RDCS (AE) Referring Phys: [phone] MCNEILL P KIRKPATRICK IMPRESSIONS  1. Small septal based LV apical filling defect  seen with contrast, suspect false tendon. Apical wall motion is hyperdynamic.Marland Kitchen Left ventricular ejection fraction, by estimation, is 70 to 75%. The left ventricle has hyperdynamic function. The left ventricle has no regional wall motion abnormalities. There is moderate left ventricular hypertrophy. Left ventricular diastolic parameters are consistent with Grade I diastolic dysfunction (impaired relaxation).  2. Right ventricular systolic function is normal. The right ventricular size is normal.  3. Left atrial size was mildly dilated.  4. The mitral valve is grossly normal. Trivial mitral valve regurgitation.  5. The aortic valve is tricuspid. Aortic valve regurgitation is not visualized. Mild aortic valve stenosis. Aortic valve area, by VTI measures 1.85 cm. Aortic valve mean gradient measures 11.0 mmHg. Aortic valve Vmax measures 2.15 m/s. Conclusion(s)/Recommendation(s): No intracardiac source of embolism detected on this transthoracic study. A transesophageal echocardiogram is recommended to exclude cardiac source of embolism if clinically indicated. FINDINGS  Left Ventricle: Small septal based LV apical filling defect seen with contrast, suspect false tendon. Apical wall motion is hyperdynamic. Left ventricular ejection fraction, by estimation, is 70 to 75%. The left ventricle has hyperdynamic function. The left  ventricle has no regional wall motion abnormalities. Definity contrast agent was given IV to delineate the left ventricular endocardial borders. The left ventricular internal cavity size was normal in size. There is moderate left ventricular hypertrophy. Left ventricular diastolic parameters are consistent with Grade I diastolic dysfunction (impaired relaxation). Indeterminate filling pressures. Right Ventricle: The right ventricular size is normal. No increase in right ventricular wall thickness. Right ventricular systolic function is normal. Left Atrium: Left atrial size was mildly dilated. Right Atrium: Right atrial size was normal in size. Pericardium: There is no evidence of pericardial effusion. Mitral Valve: The mitral valve is grossly normal. Trivial mitral valve regurgitation. MV peak gradient, 5.9 mmHg. The mean mitral valve gradient is 3.0 mmHg. Tricuspid Valve: The tricuspid valve is grossly normal. Tricuspid valve regurgitation is trivial. Aortic Valve: The aortic valve is tricuspid. Aortic valve regurgitation is not visualized. Mild aortic stenosis is present. Aortic valve mean gradient measures 11.0 mmHg. Aortic valve peak gradient measures 18.5 mmHg. Aortic valve area, by VTI measures 1.85 cm. Pulmonic Valve: The pulmonic valve was grossly normal. Pulmonic valve regurgitation is not visualized. Aorta: The aortic root and ascending aorta are structurally normal, with no evidence of dilitation. Venous: IVC assessment for right atrial pressure unable to be performed due to mechanical ventilation. IAS/Shunts: No atrial level shunt detected by color flow Doppler.  LEFT VENTRICLE PLAX 2D LVIDd:         3.60 cm  Diastology LVIDs:         2.10 cm  LV e' lateral:   8.38 cm/s LV PW:         1.30 cm  LV E/e' lateral: 11.4 LV IVS:        1.30 cm  LV e' medial:    6.31 cm/s LVOT diam:     1.70 cm  LV E/e' medial:  15.1 LV SV:         70 LV SV Index:   35 LVOT Area:     2.27 cm  RIGHT VENTRICLE             IVC  RV Basal diam:  2.50 cm     IVC diam: 2.10 cm RV S prime:     18.10 cm/s TAPSE (M-mode): 3.0 cm LEFT ATRIUM             Index  RIGHT ATRIUM           Index LA diam:        3.40 cm 1.72 cm/m  RA Area:     12.30 cm LA Vol (A2C):   56.7 ml 28.72 ml/m RA Volume:   26.90 ml  13.62 ml/m LA Vol (A4C):   74.4 ml 37.68 ml/m LA Biplane Vol: 66.9 ml 33.88 ml/m  AORTIC VALVE AV Area (Vmax):    1.96 cm AV Area (Vmean):   2.01 cm AV Area (VTI):     1.85 cm AV Vmax:           215.00 cm/s AV Vmean:          157.000 cm/s AV VTI:            0.377 m AV Peak Grad:      18.5 mmHg AV Mean Grad:      11.0 mmHg LVOT Vmax:         186.00 cm/s LVOT Vmean:        139.000 cm/s LVOT VTI:          0.307 m LVOT/AV VTI ratio: 0.81  AORTA Ao Root diam: 2.90 cm Ao Asc diam:  3.10 cm MITRAL VALVE MV Area (PHT): 2.99 cm     SHUNTS MV Peak grad:  5.9 mmHg     Systemic VTI:  0.31 m MV Mean grad:  3.0 mmHg     Systemic Diam: 1.70 cm MV Vmax:       1.21 m/s MV Vmean:      81.9 cm/s MV Decel Time: 254 msec MV E velocity: 95.50 cm/s MV A velocity: 128.00 cm/s MV E/A ratio:  0.75 Zoila Shutter MD Electronically signed by Zoila Shutter MD Signature Date/Time: 07/17/2020/3:39:13 PM    Final    VAS [place] LOWER EXTREMITY VENOUS (DVT)  Result Date: 07/18/2020  Lower Venous DVTStudy Indications: Stroke.  Comparison Study: no prior Performing Technologist: Blanch Media RVS  Examination Guidelines: A complete evaluation includes B-mode imaging, spectral Doppler, color Doppler, and power Doppler as needed of all accessible portions of each vessel. Bilateral testing is considered an integral part of a complete examination. Limited examinations for reoccurring indications may be performed as noted. The reflux portion of the exam is performed with the patient in reverse Trendelenburg.  +---------+---------------+---------+-----------+----------+--------------+ RIGHT    CompressibilityPhasicitySpontaneityPropertiesThrombus Aging  +---------+---------------+---------+-----------+----------+--------------+ CFV      Full           Yes      Yes                                 +---------+---------------+---------+-----------+----------+--------------+ SFJ      Full                                                        +---------+---------------+---------+-----------+----------+--------------+ FV Prox  Full                                                        +---------+---------------+---------+-----------+----------+--------------+ FV Mid   Full                                                        +---------+---------------+---------+-----------+----------+--------------+  FV DistalFull                                                        +---------+---------------+---------+-----------+----------+--------------+ PFV      Full                                                        +---------+---------------+---------+-----------+----------+--------------+ POP      Full           Yes      Yes                                 +---------+---------------+---------+-----------+----------+--------------+ PTV      Full                                                        +---------+---------------+---------+-----------+----------+--------------+ PERO     Full                                                        +---------+---------------+---------+-----------+----------+--------------+   +---------+---------------+---------+-----------+----------+--------------+ LEFT     CompressibilityPhasicitySpontaneityPropertiesThrombus Aging +---------+---------------+---------+-----------+----------+--------------+ CFV      Full           Yes      Yes                                 +---------+---------------+---------+-----------+----------+--------------+ SFJ      Full                                                         +---------+---------------+---------+-----------+----------+--------------+ FV Prox  Full                                                        +---------+---------------+---------+-----------+----------+--------------+ FV Mid   Full                                                        +---------+---------------+---------+-----------+----------+--------------+ FV DistalFull                                                        +---------+---------------+---------+-----------+----------+--------------+  PFV      Full                                                        +---------+---------------+---------+-----------+----------+--------------+ POP      Full           Yes      Yes                                 +---------+---------------+---------+-----------+----------+--------------+ PTV      Full                                                        +---------+---------------+---------+-----------+----------+--------------+ PERO     Full                                                        +---------+---------------+---------+-----------+----------+--------------+     Summary: BILATERAL: - No evidence of deep vein thrombosis seen in the lower extremities, bilaterally. - No evidence of superficial venous thrombosis in the lower extremities, bilaterally. -   *See table(s) above for measurements and observations. Electronically signed by Fabienne Bruns MD on 07/18/2020 at 7:33:36 PM.    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 Does the need for close, 24 hr/day medical supervision in concert with the patient's rehab needs make it unreasonable for this patient to be served in a less intensive setting? Yes 2. Co-Morbidities requiring supervision/potential complications: mild mitral regurgitation, patent foramen ovale 3. Due to bladder management, bowel management, safety, skin/wound care, disease management,  medication administration, pain management and patient education, does the patient require 24 hr/day rehab nursing? Yes 4. Does the patient require coordinated care of a physician, rehab nurse, therapy disciplines of PT, OT, SLP to address physical and functional deficits in the context of the above medical diagnosis(es)? Yes Addressing deficits in the following areas: balance, endurance, locomotion, strength, transferring, bowel/bladder control, bathing, dressing, feeding, grooming, toileting, cognition, speech, language, swallowing and psychosocial support 5. Can the patient actively participate in an intensive therapy program of at least 3 hrs of therapy per day at least 5 days per week? Yes 6. The potential for patient to make measurable gains while on inpatient rehab is excellent 7. Anticipated functional outcomes upon discharge from inpatient rehab are min assist  with PT, min assist with OT, min assist with SLP. 8. Estimated rehab length of stay to reach the above functional goals is: 20-22 days 9. Anticipated discharge destination: Home 10. Overall Rehab/Functional Prognosis: excellent  RECOMMENDATIONS: This patient's condition is appropriate for continued rehabilitative care in the following setting: CIR Patient has agreed to participate in recommended program. Yes Note that insurance prior authorization may be required for reimbursement for recommended care.  Comment: Mr. Siri Cole would be an excellent CIR candidate. He has severe deficits in mobility and aphasia and would benefit from intensive PT, OT, and SLP  Reuel Boom  J Angiulli, PA-C 07/19/2020   I have personally performed a face to face diagnostic evaluation, including, but not limited to relevant history and physical exam findings, of this patient and developed relevant assessment and plan.  Additionally, I have reviewed and concur with the physician assistant's documentation above.  Sula Soda, MD

## 2020-07-19 NOTE — Transfer of Care (Signed)
Immediate Anesthesia Transfer of Care Note  Patient: Tanya Lopez  Procedure(s) Performed: TRANSESOPHAGEAL ECHOCARDIOGRAM (TEE) (N/A )  Patient Location: Endoscopy Unit  Anesthesia Type:MAC  Level of Consciousness: drowsy and patient cooperative  Airway & Oxygen Therapy: Patient Spontanous Breathing and Patient connected to nasal cannula oxygen  Post-op Assessment: Report given to RN and Post -op Vital signs reviewed and stable  Post vital signs: Reviewed  Last Vitals:  Vitals Value Taken Time  BP 164/87 07/19/20 1155  Temp    Pulse 83 07/19/20 1156  Resp 22 07/19/20 1156  SpO2 100 % 07/19/20 1156  Vitals shown include unvalidated device data.  Last Pain:  Vitals:   07/19/20 1021  TempSrc: Temporal  PainSc:          Complications: No complications documented.

## 2020-07-19 NOTE — Progress Notes (Signed)
TCD bubble study has been completed.   Preliminary results in CV Proc.   Blanch Media 07/19/2020 3:42 PM

## 2020-07-19 NOTE — Progress Notes (Signed)
STROKE TEAM PROGRESS NOTE   INTERVAL HISTORY Husband at bedside. Pt awake alert, speech much improved, however, still has right hemiplegia. TEE showed + PFO with RLS at rest. Will do TCD bubble study.    OBJECTIVE Vitals:   07/19/20 1155 07/19/20 1205 07/19/20 1215 07/19/20 1237  BP: (!) 164/87 (!) 168/87 (!) 175/87 (!) 173/99  Pulse: 83 87 84 90  Resp: 21 21 21 17   Temp: 97.7 F (36.5 C)   98.7 F (37.1 C)  TempSrc: Temporal   Oral  SpO2: 100% 99% 98% 94%  Weight:      Height:       CBC:  Recent Labs  Lab 07/16/20 2204 07/16/20 2208 07/18/20 0522 07/19/20 0300  WBC 9.5   < > 7.6 6.7  NEUTROABS 4.4  --   --   --   HGB 12.1   < > 11.3* 11.1*  HCT 37.9   < > 37.5 36.8  MCV 79.6*   < > 80.3 80.7  PLT 407*   < > 319 363   < > = values in this interval not displayed.   Basic Metabolic Panel:  Recent Labs  Lab 07/18/20 0522 07/19/20 0300  NA 141 141  K 3.6 3.2*  CL 111 108  CO2 21* 23  GLUCOSE 99 103*  BUN 5* 5*  CREATININE 0.79 0.67  CALCIUM 8.4* 8.6*   Lipid Panel:     Component Value Date/Time   CHOL 239 (H) 07/19/2020 0300   TRIG 362 (H) 07/19/2020 0300   TRIG 361 (H) 07/19/2020 0300   HDL 43 07/19/2020 0300   CHOLHDL 5.6 07/19/2020 0300   VLDL 72 (H) 07/19/2020 0300   LDLCALC 124 (H) 07/19/2020 0300   HgbA1c:  Lab Results  Component Value Date   HGBA1C 5.6 07/18/2020   Urine Drug Screen: No results found for: LABOPIA, COCAINSCRNUR, LABBENZ, AMPHETMU, THCU, LABBARB  Alcohol Level No results found for: [facility]  IMAGING  CT HEAD CODE STROKE WO CONTRAST 07/16/2020 1. Focal subarachnoid hemorrhage in the medial posterior left frontal lobe. Question venous hemorrhage. Vascular malformation considered less likely. Alternatively, could represent hemorrhagic conversion of an occult infarct.  2. Otherwise normal CT appearance of the brain.  3. ASPECTS is 10/10   CT Code Stroke CTA Head W/WO contrast CT Code Stroke CTA Neck W/WO contrast 07/16/2020 1. Left  A2 segment occlusion with poor reconstitution of distal branch vessels.  2. Incomplete filling of posterior left frontal cortical vein could represent cortical vein thrombosis. Cerebral arteriogram would be useful for further evaluation of both possibilities.  3. Mild tortuosity of cervical vessels without significant cervical stenosis. This is most commonly seen in the setting of chronic hypertension.   Neuro Interventional Radiology - Cerebral Angiogram  07/17/2020 S/P 4 vessel cerebral arteriogram - RT CFA approach. 1. Smooth areas of  narrowing of Lt ACA A2 and branches of A2 suspicious for dissection, less likely ICAD v vasospasm. No intraluminal filling defects noted . 2. Cortical veins and DVsinuses widely patent.  MR BRAIN WO CONTRAST MR ANGIO HEAD WO CONTRAST 07/17/2020 1. Moderate-sized acute left ACA territory infarct. 2. Small acute right cerebellar, left occipital, and right thalamic infarcts. 3. Motion degraded head MRA with apparent improved left A2 segment flow compared to yesterday's CTA though with at least 1 persistent severe stenosis.   DG Chest Port 1 View 07/17/2020 1. Endotracheal tube as above.  2. Low lung volumes with bibasilar atelectasis.  3. No pneumothorax or large pleural effusion.  ECHOCARDIOGRAM COMPLETE 07/17/2020 1. Small septal based LV apical filling defect seen with contrast, suspect false tendon. Apical wall motion is hyperdynamic.Marland Kitchen Left ventricular ejection fraction, by estimation, is 70 to 75%. The left ventricle has hyperdynamic function. The left ventricle has no regional wall motion abnormalities. There is moderate left ventricular hypertrophy. Left ventricular diastolic parameters are consistent with Grade I diastolic dysfunction (impaired relaxation).  2. Right ventricular systolic function is normal. The right ventricular size is normal.  3. Left atrial size was mildly dilated.  4. The mitral valve is grossly normal. Trivial mitral valve  regurgitation.  5. The aortic valve is tricuspid. Aortic valve regurgitation is not visualized. Mild aortic valve stenosis. Aortic valve area, by VTI measures 1.85 cm. Aortic valve mean gradient measures 11.0 mmHg. Aortic valve Vmax measures 2.15 m/s. Conclusion(s)/Recommendation(s): No intracardiac source of embolism detected on this transthoracic study. A transesophageal echocardiogram is recommended to exclude cardiac source of embolism if clinically indicated.   TRANSESOPHAGEAL ECHOCARDIOGRAM  07/19/2020 LVEF >55% Mild MR.  Trivial TR and AR No LA/LAA thrombus or mass +PFO by saline microcavitation study.  There was right to left flow at rest.  No ASD.   BILATERAL LOWER EXTREMITY DOPPLER 07/18/2020 - No evidence of deep vein thrombosis seen in the lower extremities, bilaterally.  - No evidence of superficial venous thrombosis in the lower extremities, bilaterally.   ECG - SR rate 83 BPM. (See cardiology reading for complete details)   PHYSICAL EXAM   Temp:  [97.1 F (36.2 C)-99.2 F (37.3 C)] 98.7 F (37.1 C) (07/27 1237) Pulse Rate:  [83-105] 90 (07/27 1237) Resp:  [16-23] 17 (07/27 1237) BP: (152-177)/(87-120) 173/99 (07/27 1237) SpO2:  [94 %-100 %] 94 % (07/27 1237) Weight:  [98.1 kg] 98.1 kg (07/26 1900)  General - Well nourished, well developed, not in acute distress  Ophthalmologic - fundi not visualized due to noncooperation.  Cardiovascular - Regular rate and rhythm.  Neuro - awake alert, eyes open, following 3-step commands. Able to have spontaneous speech with simple sentences, still has difficulty forming sentences with significant hesitation. However, able to name 3/4 and repeat well. Eyes in mid position, no gaze palsy, visual field full, able to track bilaterally, PERRL. Mild right facial asymmetry, tongue midline.  Left UE and LLE 5/5, however, no movement of right UE and LE. DTR 1+ and no babinski. Sensation symmetrical subjectively, coordination intact LUE and  gait not tested.   ASSESSMENT/PLAN [name] is a 49 y.o. female with history of hypertension who presents with right sided weakness and aphasia. She did not receive IV t-PA due to a small cortical subarachnoid that appeared acute. CTA -> left A2 occlusion. Cerebral angiogram -> narrowing of Lt ACA A2 and branches of A2 suspicious for dissection.  Stroke: left ACA infarcts and also right cerebellar and left temporoparietal small infarct with focal small SAH in the left frontal parafalcine with left A2 segment stenosis, etiology unclear, possible ACA dissection vs. cardioemoblic source  CT Head - Focal subarachnoid hemorrhage in the medial posterior left frontal lobe.  ASPECTS is 10/10   CTA H&N - Left A2 segment occlusion with poor reconstitution of distal branch vessels. Incomplete filling of posterior left frontal cortical vein could represent cortical vein thrombosis.  Cerebral Angiogram - Smooth areas of  narrowing of Lt ACA A2 and branches of A2 suspicious for dissection, less likely ICAD v vasospasm. No intraluminal filling defects noted. No cortical venous thrombosis  MRI head - moderate L ACA territory infarct.  Small R cerebellar, L occipital, R thalamic infarcts.   MRA head - motion. Improved L A2 flow w/ at least 1 severe stenosis   2D Echo - EF 70-75%. No source of embolus. LA mildy dilated  LE doppler neg DVT  TEE + PFO w/ R to L shunt at rest   TCD bubble study pending  If PFO not significant on TCD bubble study, will consider loop vs. 30 day monitoring  [employer] Virus 2 - negative  Direct LDL - 87.5   Lipid panel LDL 124  HgbA1c - 5.6  Hypercoagulable labs pending     VTE prophylaxis - Heparin 5000 units sq tid   No antithrombotic prior to admission, now on aspirin 325 po daily. Will start DAPT in 2 days.    Ongoing aggressive stroke risk factor management  Therapy recommendations:  CIR  Disposition:  Pending  Follow-up Dr.  Corliss Skains. Repeat CTA head and neck in 1 month as an OP  Respiratory failure  Intubated for IR procedure  Remain intubated post op for airway protection  Extubation 7/25  Tolerated well  PFO  TEE showed positive PFO with RLS at rest  TCD bubble pending  ROPE score = 7  Hypertension  Home BP meds: norvasc 10, bysostolic 5  Treated w/  Cardene, now off  Stable on the high end . SBP goal < 180 . Resumed norvasc 5->10 . Resume bystolic 5 . Long-term BP goal normotensive  Hyperlipidemia  Home Lipid lowering medication: none listed  TG 1600->1549->492->362 - now off cleviprex and propofol  TC 302->222->239  Direct LDL - 87.5, lipid panel LDL 124, goal < 70  Put on lipitor 80  Continue statin at discharge  Urinary retention  Has I&O 3 times  Bladder scan showed large urine  Foley catheter placed 7/26  Other Stroke Risk Factors  Obesity, Body mass index is 38.32 kg/m., recommend weight loss, diet and exercise as appropriate   Other Active Problems  Code status - Full Code  Mildly elevated lactic acid - Unclear etiology - monitoring  Hypokalemia 3.2 - supplement (20 q4 x 2) - recheck   Hospital day # 3  Marvel Plan, MD PhD Stroke Neurology 07/19/2020 1:21 PM   To contact Stroke Continuity provider, please refer to [email or web address]. After hours, contact General Neurology

## 2020-07-19 NOTE — H&P (Signed)
Tanya Lopez is a 49 y.o. female who has presented today for surgery, with the diagnosis of stroke.  The various methods of treatment have been discussed with the patient and family. After consideration of risks, benefits and other options for treatment, the patient has consented to  Procedure(s): TRANSESOPHAGEAL ECHOCARDIOGRAM (TEE) (N/A) as a surgical intervention .  The patient's history has been reviewed, patient examined, no change in status, stable for surgery.  I have reviewed the patient's chart and labs.  Questions were answered to the patient's satisfaction.    [name] C. Duke Salvia, MD, [facility]  07/19/2020 8:54 AM

## 2020-07-19 NOTE — PMR Pre-admission (Addendum)
PMR Admission Coordinator Pre-Admission Assessment  Patient: Tanya Lopez is an 49 y.o., female MRN: [id number] DOB: 02-12-1971 Height: 5\' 3"  (160 cm) Weight: (!) 98.1 kg   Patient's name is correctly spelled as . Buck ( admitting and [employer] notified of correct spelling to be corrected on 7/28)           Insurance Information HMO:     PPO: yes     PCP:      IPA:      80/20:      OTHER:  PRIMARY: BCBS of       Policy#: 8/28      Subscriber: spouse CM Name: fax approval only      Phone#: [phone]     Fax#: [phone] Pre-Cert#: [id number] approved until 8/4 when updates are due      Employer: [employer]:  Phone #: [phone]     Name: 7/28 Eff. Date: 11/24/2019     Deduct: $5000      Out of Pocket Max: [phone] includes deductible      Life Max: none  CIR: 50%      SNF: 70% limited to 60 days per year Outpatient: 70%     Co-Pay: 30 visits combined Home Health: 70%      Co-Pay: 30% DME: 50%     Co-Pay: 50% Providers: in network  SECONDARY: none  Financial Counselor:       Phone#:   The $9485" for patients in Inpatient Rehabilitation Facilities with attached "Privacy Act Statement-Health Care Records" was provided and verbally reviewed with: N/A  Emergency Contact Information Contact Information    Name Relation Home Work Mobile   Data processing manager Spouse   [phone]     Current Medical History  Patient Admitting Diagnosis: CVA  History of Present Illness: 49 y.o. right-handed female with history of hypertension.  Presented 07/16/2020 with acute onset of left-sided weakness and aphasia.  Cranial CT scan showed focal subarachnoid hemorrhage in the medial posterior left frontal lobe.  CT angiogram of head and neck showed left A2 segment occlusion with poor reconstitution of distal branch vessels.  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  Patient did  not receive TPA.  Admission chemistries unremarkable, urinalysis negative nitrite, lactic acid 2.5.  Cerebral angiogram completed showing smooth areas of narrowing of left ACA A2 and branches of A2 suspicious for possible dissection but no cortical thrombosis.  Plan currently is to follow-up with interventional radiology 1 month for repeat CTA of head and neck.  Patient did initially require ventilatory support and extubated 07/17/2020.  Lower extremity Dopplers negative for DVT.  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  Small acute right cerebellar left occipital and right thalamic infarct.  Motion degraded head MRA with apparent improved left A2 segment flow compared to prior CTA.  Maintained on Cardene for blood pressure control.  Echocardiogram with ejection fraction of 75% no wall motion abnormalities. Patient currently maintained on aspirin 325 mg daily for CVA prophylaxis.  Subcutaneous heparin for DVT prophylaxis. TEE 7/27 positive PFO with right to left shunt at rest. Plans for 30 day Holter monitoring as an outpatient and follow up with Dr. 8/27 for assessment of PFO closure.  Complete NIHSS TOTAL: 10 Glasgow Coma Scale Score: 15  Past Medical History  Past Medical History:  Diagnosis Date  . Hypertension   . PFO (patent foramen ovale) 07/19/2020   Noted on TEE 07/19/2020  Family History  family history is not on file.  Prior Rehab/Hospitalizations:  Has the patient had prior rehab or hospitalizations prior to admission? Yes  Has the patient had major surgery during 100 days prior to admission? No  Current Medications   Current Facility-Administered Medications:  .   stroke: mapping our early stages of recovery book, , Does not apply, Once, Rejeana Brock, MD .  0.9 %  sodium chloride infusion, , Intravenous, Continuous, Marvel Plan, MD, Last Rate: 50 mL/hr at 07/20/20 0326, New Bag at 07/20/20 0326 .  acetaminophen (TYLENOL) tablet 650 mg, 650 mg,  Oral, Q4H PRN **OR** acetaminophen (TYLENOL) 160 MG/5ML solution 650 mg, 650 mg, Per Tube, Q4H PRN **OR** acetaminophen (TYLENOL) suppository 650 mg, 650 mg, Rectal, Q4H PRN, Rejeana Brock, MD, 650 mg at 07/17/20 2017 .  amLODipine (NORVASC) tablet 10 mg, 10 mg, Oral, Daily, Marvel Plan, MD, 10 mg at 07/20/20 1026 .  aspirin EC tablet 325 mg, 325 mg, Oral, Daily, Marvel Plan, MD, 325 mg at 07/20/20 1027 .  atorvastatin (LIPITOR) tablet 80 mg, 80 mg, Oral, Daily, Marvel Plan, MD, 80 mg at 07/20/20 1026 .  Chlorhexidine Gluconate Cloth 2 % PADS 6 each, 6 each, Topical, Q0600, Karl Ito, MD, 6 each at 07/19/20 2151 .  heparin injection 5,000 Units, 5,000 Units, Subcutaneous, Q8H, Marvel Plan, MD, 5,000 Units at 07/20/20 0617 .  iohexol (OMNIPAQUE) 300 MG/ML solution 150 mL, 150 mL, Intra-arterial, Once PRN, Deveshwar, Sanjeev, MD .  iohexol (OMNIPAQUE) 300 MG/ML solution 150 mL, 150 mL, Intra-arterial, Once PRN, Deveshwar, Sanjeev, MD .  labetalol (NORMODYNE) injection 5-20 mg, 5-20 mg, Intravenous, Q2H PRN, Marvel Plan, MD, 10 mg at 07/19/20 0107 .  mupirocin ointment (BACTROBAN) 2 % 1 application, 1 application, Nasal, BID, Rejeana Brock, MD, 1 application at 07/20/20 1027 .  nebivolol (BYSTOLIC) tablet 5 mg, 5 mg, Oral, Daily, Marvel Plan, MD, 5 mg at 07/20/20 1026 .  ondansetron (ZOFRAN) injection 4 mg, 4 mg, Intravenous, Q6H PRN, Gleason, Darcella Gasman, PA-C, 4 mg at 07/17/20 1030 .  pantoprazole (PROTONIX) EC tablet 40 mg, 40 mg, Oral, Daily, Marvel Plan, MD, 40 mg at 07/20/20 1027 .  senna-docusate (Senokot-S) tablet 1 tablet, 1 tablet, Oral, Once, Rejeana Brock, MD .  sodium chloride flush (NS) 0.9 % injection 3 mL, 3 mL, Intravenous, Once, Jacalyn Lefevre, MD  Patients Current Diet:  Diet Order            Diet Heart Room service appropriate? Yes with Assist; Fluid consistency: Thin  Diet effective now                 Precautions /  Restrictions Precautions Precautions: Fall Precaution Comments: flaccid right side Restrictions Weight Bearing Restrictions: No   Has the patient had 2 or more falls or a fall with injury in the past year?No  Prior Activity Level Community (5-7x/wk): Independent, active, driving, does photography on the side  Prior Functional Level Prior Function Level of Independence: Independent  Self Care: Did the patient need help bathing, dressing, using the toilet or eating?  Independent  Indoor Mobility: Did the patient need assistance with walking from room to room (with or without device)? Independent  Stairs: Did the patient need assistance with internal or external stairs (with or without device)? Independent  Functional Cognition: Did the patient need help planning regular tasks such as shopping or remembering to take medications? Independent  Home Assistive Devices / Equipment Home Assistive Devices/Equipment: Eyeglasses  Home Equipment: Shower seat - built in  Prior Device Use: Indicate devices/aids used by the patient prior to current illness, exacerbation or injury? None of the above  Current Functional Level Cognition  Arousal/Alertness: Awake/alert Overall Cognitive Status: Impaired/Different from baseline Difficult to assess due to: Impaired communication (aphasia) Orientation Level: Oriented X4 General Comments: Did follow all one step commands Attention: Sustained Sustained Attention: Impaired Sustained Attention Impairment: Verbal basic, Functional basic Memory:  (DTA) Awareness: Impaired Safety/Judgment: Other (comment) (DTA) Comments:  (R inattention)    Extremity Assessment (includes Sensation/Coordination)  Upper Extremity Assessment: Defer to OT evaluation RUE Deficits / Details: Flaccid, PROM WNL, no sublux noted RUE Coordination: decreased fine motor, decreased gross motor  Lower Extremity Assessment: RLE deficits/detail RLE Deficits / Details: no active  movement illicited with mobility nor to command RLE Coordination: decreased fine motor, decreased gross motor    ADLs  Overall ADL's : Needs assistance/impaired Eating/Feeding: Maximal assistance Eating/Feeding Details (indicate cue type and reason): supported sitting Grooming: Maximal assistance Grooming Details (indicate cue type and reason): supported sitting Upper Body Bathing: Maximal assistance Upper Body Bathing Details (indicate cue type and reason): supported sitting Lower Body Bathing: Maximal assistance Lower Body Bathing Details (indicate cue type and reason): Mod A sit<>stand and maintain standing with right knee blocked Upper Body Dressing : Maximal assistance Upper Body Dressing Details (indicate cue type and reason): supported sitting Lower Body Dressing: Total assistance Lower Body Dressing Details (indicate cue type and reason): Mod A sit<>stand and maintain standing with right knee blocked Toilet Transfer: Moderate assistance, +2 for physical assistance, Stand-pivot, BSC Toilet Transfer Details (indicate cue type and reason): right knee blocked and A to move RLE Toileting- Clothing Manipulation and Hygiene: Total assistance Toileting - Clothing Manipulation Details (indicate cue type and reason): Mod A sit<>stand and maintain standing with right knee blocked    Mobility  Overal bed mobility: Needs Assistance Bed Mobility: Rolling, Sidelying to Sit Rolling: Min assist Sidelying to sit: Max assist, +2 for safety/equipment General bed mobility comments: rolled to R and assist for legs off bed and trunk upright    Transfers  Overall transfer level: Needs assistance Equipment used: 2 person hand held assist Transfers: Sit to/from Stand, Stand Pivot Transfers Sit to Stand: Mod assist Stand pivot transfers: Mod assist, +2 physical assistance General transfer comment: A to block right knee for sit<>stand and stand pivot with A to move RLE as well for stand pivot bed to  [facility], then transitioned to recliner via sit to stand with +1 A and another to move [facility] and place recliner    Ambulation / Gait / Stairs / Wheelchair Mobility  Ambulation/Gait General Gait Details: NT    Posture / Balance Dynamic Sitting Balance Sitting balance - Comments: tendency for right and posterior lateral lean with min A to correct Balance Overall balance assessment: Needs assistance Sitting-balance support: Feet supported, Single extremity supported Sitting balance-Leahy Scale: Poor Sitting balance - Comments: tendency for right and posterior lateral lean with min A to correct Standing balance support: Single extremity supported Standing balance-Leahy Scale: Zero    Special needs/care consideration Designated visitors are Fayrene FearingJames, spouse and second visitor to be determined Spouse is an MD on medical leave currently due to his Cancer treatments Indwelling catheter double lumen 14 fr placed 7/26   Previous Home Environment  Living Arrangements: Spouse/significant other (HIgh school daughter and college aged daughter)  Lives With: Spouse, Family Available Help at Discharge: Family, Available 24 hours/day Type of Home: House Home  Layout: One level Home Access: Stairs to enter Entrance Stairs-Rails: None Entrance Stairs-Number of Steps: 3 Bathroom Shower/Tub: Psychologist, counselling, Sport and exercise psychologist: Standard Bathroom Accessibility: Yes How Accessible: Accessible via walker Home Care Services: No  Discharge Living Setting Plans for Discharge Living Setting: Patient's home, Lives with (comment) (spouse and two daughters) Type of Home at Discharge: House Discharge Home Layout: One level Discharge Home Access: Stairs to enter Entrance Stairs-Rails: None Entrance Stairs-Number of Steps: 3 Discharge Bathroom Shower/Tub: Walk-in shower, Door Discharge Bathroom Toilet: Standard Discharge Bathroom Accessibility: Yes How Accessible: Accessible via walker Does the patient have any  problems obtaining your medications?: No  Social/Family/Support Systems Patient Roles: Spouse, Parent Contact Information: spouse, Pearson Grippe Anticipated Caregiver: spouse, pt's sister from [place] and/or paretns in [place] to assist Anticipated Caregiver's Contact Information: [phone] Ability/Limitations of Caregiver: spouse on leave from work after undergoing Colon cancer and treatments postoperatively Caregiver Availability: 24/7 Discharge Plan Discussed with Primary Caregiver: Yes Is Caregiver In Agreement with Plan?: Yes Does Caregiver/Family have Issues with Lodging/Transportation while Pt is in Rehab?: No  Goals Patient/Family Goal for Rehab: min asisst with PT, OT, and SLP Expected length of stay: ELOS 20 to 22 days Pt/Family Agrees to Admission and willing to participate: Yes Program Orientation Provided & Reviewed with Pt/Caregiver Including Roles  & Responsibilities: Yes  Decrease burden of Care through IP rehab admission: n/a  Possible need for SNF placement upon discharge:not anticipated  Patient Condition: This patient's condition remains as documented in the consult dated 07/19/2020, in which the Rehabilitation Physician determined and documented that the patient's condition is appropriate for intensive rehabilitative care in an inpatient rehabilitation facility. Will admit to inpatient rehab today.  Preadmission Screen Completed By:  Clois Dupes, RN, 07/20/2020 10:57 AM ______________________________________________________________________   Discussed status with Dr. Allena Katz on 07/20/2020 at 1058 and received approval for admission today.  Admission Coordinator:  Clois Dupes, time 8502 Date 07/20/2020

## 2020-07-19 NOTE — Anesthesia Preprocedure Evaluation (Addendum)
Anesthesia Evaluation  Patient identified by MRN, date of birth, ID band Patient awake    Reviewed: Allergy & Precautions, NPO status , Patient's Chart, lab work & pertinent test results  Airway Mallampati: II  TM Distance: >3 FB     Dental  (+) Teeth Intact, Dental Advisory Given   Pulmonary    breath sounds clear to auscultation       Cardiovascular hypertension, + dysrhythmias Atrial Fibrillation  Rhythm:Irregular Rate:Normal     Neuro/Psych    GI/Hepatic negative GI ROS, Neg liver ROS,   Endo/Other  negative endocrine ROS  Renal/GU negative Renal ROS     Musculoskeletal   Abdominal   Peds  Hematology   Anesthesia Other Findings   Reproductive/Obstetrics                            Anesthesia Physical Anesthesia Plan  ASA: III  Anesthesia Plan: MAC   Post-op Pain Management:    Induction: Intravenous  PONV Risk Score and Plan: Propofol infusion and Treatment may vary due to age or medical condition  Airway Management Planned: Simple Face Mask  Additional Equipment:   Intra-op Plan:   Post-operative Plan:   Informed Consent: I have reviewed the patients History and Physical, chart, labs and discussed the procedure including the risks, benefits and alternatives for the proposed anesthesia with the patient or authorized representative who has indicated his/her understanding and acceptance.     Dental advisory given  Plan Discussed with: CRNA and Anesthesiologist  Anesthesia Plan Comments:        Anesthesia Quick Evaluation

## 2020-07-19 NOTE — Progress Notes (Signed)
PT Cancellation Note  Patient Details Name: Lenor Provencher MRN: [id number] DOB: August 28, 1971   Cancelled Treatment:    Reason Eval/Treat Not Completed: Patient at procedure or test/unavailable; off the floor for testing, per spouse down for bubble study.  Will attempt again another day.   Elray Mcgregor 07/19/2020, 3:34 PM  Sheran Lawless, PT Acute Rehabilitation Services Pager:[phone] [phone] 07/19/2020

## 2020-07-19 NOTE — Anesthesia Postprocedure Evaluation (Signed)
Anesthesia Post Note  Patient: Tanya Lopez  Procedure(s) Performed: TRANSESOPHAGEAL ECHOCARDIOGRAM (TEE) (N/A )     Patient location during evaluation: Endoscopy Anesthesia Type: General Level of consciousness: awake Pain management: pain level controlled Vital Signs Assessment: post-procedure vital signs reviewed and stable Respiratory status: spontaneous breathing Cardiovascular status: stable Postop Assessment: no apparent nausea or vomiting Anesthetic complications: no   No complications documented.  Last Vitals:  Vitals:   07/19/20 1215 07/19/20 1237  BP: (!) 175/87 (!) 173/99  Pulse: 84 90  Resp: 21 17  Temp:  37.1 C  SpO2: 98% 94%    Last Pain:  Vitals:   07/19/20 1237  TempSrc: Oral  PainSc:                  [name]

## 2020-07-20 ENCOUNTER — Encounter (HOSPITAL_COMMUNITY): Payer: Self-pay | Admitting: Physical Medicine & Rehabilitation

## 2020-07-20 ENCOUNTER — Inpatient Hospital Stay (HOSPITAL_COMMUNITY)
Admission: RE | Admit: 2020-07-20 | Discharge: 2020-08-11 | DRG: 057 | Disposition: A | Discharge status: Home / Self Care | Payer: BC Managed Care – PPO | Source: Intra-hospital | Attending: Physical Medicine & Rehabilitation | Admitting: Physical Medicine & Rehabilitation

## 2020-07-20 ENCOUNTER — Encounter (HOSPITAL_COMMUNITY): Payer: Self-pay | Admitting: Cardiovascular Disease

## 2020-07-20 DIAGNOSIS — I69390 Apraxia following cerebral infarction: Secondary | ICD-10-CM | POA: Diagnosis not present

## 2020-07-20 DIAGNOSIS — I69322 Dysarthria following cerebral infarction: Secondary | ICD-10-CM | POA: Diagnosis not present

## 2020-07-20 DIAGNOSIS — I6900 Unspecified sequelae of nontraumatic subarachnoid hemorrhage: Secondary | ICD-10-CM

## 2020-07-20 DIAGNOSIS — E876 Hypokalemia: Secondary | ICD-10-CM | POA: Diagnosis present

## 2020-07-20 DIAGNOSIS — R4587 Impulsiveness: Secondary | ICD-10-CM | POA: Diagnosis present

## 2020-07-20 DIAGNOSIS — N390 Urinary tract infection, site not specified: Secondary | ICD-10-CM | POA: Diagnosis present

## 2020-07-20 DIAGNOSIS — E669 Obesity, unspecified: Secondary | ICD-10-CM | POA: Diagnosis present

## 2020-07-20 DIAGNOSIS — B9620 Unspecified Escherichia coli [E. coli] as the cause of diseases classified elsewhere: Secondary | ICD-10-CM | POA: Diagnosis present

## 2020-07-20 DIAGNOSIS — Z823 Family history of stroke: Secondary | ICD-10-CM | POA: Diagnosis not present

## 2020-07-20 DIAGNOSIS — Q211 Atrial septal defect: Secondary | ICD-10-CM

## 2020-07-20 DIAGNOSIS — K5900 Constipation, unspecified: Secondary | ICD-10-CM | POA: Diagnosis not present

## 2020-07-20 DIAGNOSIS — I69351 Hemiplegia and hemiparesis following cerebral infarction affecting right dominant side: Secondary | ICD-10-CM | POA: Diagnosis present

## 2020-07-20 DIAGNOSIS — Y92230 Patient room in hospital as the place of occurrence of the external cause: Secondary | ICD-10-CM | POA: Diagnosis not present

## 2020-07-20 DIAGNOSIS — I10 Essential (primary) hypertension: Secondary | ICD-10-CM | POA: Diagnosis present

## 2020-07-20 DIAGNOSIS — N319 Neuromuscular dysfunction of bladder, unspecified: Secondary | ICD-10-CM | POA: Diagnosis present

## 2020-07-20 DIAGNOSIS — R739 Hyperglycemia, unspecified: Secondary | ICD-10-CM | POA: Diagnosis present

## 2020-07-20 DIAGNOSIS — E785 Hyperlipidemia, unspecified: Secondary | ICD-10-CM | POA: Diagnosis present

## 2020-07-20 DIAGNOSIS — I63522 Cerebral infarction due to unspecified occlusion or stenosis of left anterior cerebral artery: Secondary | ICD-10-CM | POA: Diagnosis present

## 2020-07-20 DIAGNOSIS — S7001XA Contusion of right hip, initial encounter: Secondary | ICD-10-CM | POA: Diagnosis not present

## 2020-07-20 DIAGNOSIS — I69320 Aphasia following cerebral infarction: Secondary | ICD-10-CM | POA: Diagnosis not present

## 2020-07-20 DIAGNOSIS — I63512 Cerebral infarction due to unspecified occlusion or stenosis of left middle cerebral artery: Secondary | ICD-10-CM

## 2020-07-20 DIAGNOSIS — Z79899 Other long term (current) drug therapy: Secondary | ICD-10-CM

## 2020-07-20 DIAGNOSIS — R710 Precipitous drop in hematocrit: Secondary | ICD-10-CM | POA: Diagnosis not present

## 2020-07-20 DIAGNOSIS — Q2112 Patent foramen ovale: Secondary | ICD-10-CM

## 2020-07-20 DIAGNOSIS — M21371 Foot drop, right foot: Secondary | ICD-10-CM | POA: Diagnosis present

## 2020-07-20 DIAGNOSIS — Z6836 Body mass index (BMI) 36.0-36.9, adult: Secondary | ICD-10-CM

## 2020-07-20 DIAGNOSIS — W19XXXA Unspecified fall, initial encounter: Secondary | ICD-10-CM | POA: Diagnosis not present

## 2020-07-20 DIAGNOSIS — K219 Gastro-esophageal reflux disease without esophagitis: Secondary | ICD-10-CM | POA: Diagnosis present

## 2020-07-20 DIAGNOSIS — R339 Retention of urine, unspecified: Secondary | ICD-10-CM | POA: Diagnosis not present

## 2020-07-20 DIAGNOSIS — R4701 Aphasia: Secondary | ICD-10-CM | POA: Diagnosis not present

## 2020-07-20 DIAGNOSIS — I609 Nontraumatic subarachnoid hemorrhage, unspecified: Secondary | ICD-10-CM

## 2020-07-20 DIAGNOSIS — R29818 Other symptoms and signs involving the nervous system: Secondary | ICD-10-CM

## 2020-07-20 DIAGNOSIS — I670 Dissection of cerebral arteries, nonruptured: Secondary | ICD-10-CM | POA: Diagnosis present

## 2020-07-20 LAB — BASIC METABOLIC PANEL
Anion gap: 8 (ref 5–15)
BUN: 6 mg/dL (ref 6–20)
CO2: 23 mmol/L (ref 22–32)
Calcium: 8.9 mg/dL (ref 8.9–10.3)
Chloride: 110 mmol/L (ref 98–111)
Creatinine, Ser: 0.61 mg/dL (ref 0.44–1.00)
GFR calc Af Amer: >60 mL/min (ref >60)
GFR calc non Af Amer: >60 mL/min (ref >60)
Glucose, Bld: 102 mg/dL — ABNORMAL HIGH (ref 70–99)
Potassium: 3.4 mmol/L — ABNORMAL LOW (ref 3.5–5.1)
Sodium: 141 mmol/L (ref 135–145)

## 2020-07-20 LAB — TRIGLYCERIDES: Triglycerides: 330 mg/dL — ABNORMAL HIGH (ref <150)

## 2020-07-20 LAB — ANCA TITERS
Atypical P-ANCA titer: <1:20 {titer}
C-ANCA: <1:20 {titer}
P-ANCA: <1:20 {titer}

## 2020-07-20 LAB — LUPUS ANTICOAGULANT PANEL
DRVVT: 41.4 s (ref 0.0–47.0)
PTT Lupus Anticoagulant: 33.9 s (ref 0.0–51.9)

## 2020-07-20 LAB — HOMOCYSTEINE: Homocysteine: 10.2 umol/L (ref 0.0–14.5)

## 2020-07-20 LAB — CBC
HCT: 38.3 % (ref 36.0–46.0)
Hemoglobin: 11.5 g/dL — ABNORMAL LOW (ref 12.0–15.0)
MCH: 24.1 pg — ABNORMAL LOW (ref 26.0–34.0)
MCHC: 30 g/dL (ref 30.0–36.0)
MCV: 80.1 fL (ref 80.0–100.0)
Platelets: 334 10*3/uL (ref 150–400)
RBC: 4.78 MIL/uL (ref 3.87–5.11)
RDW: 19.9 % — ABNORMAL HIGH (ref 11.5–15.5)
WBC: 5.9 10*3/uL (ref 4.0–10.5)
nRBC: 0 % (ref 0.0–0.2)

## 2020-07-20 LAB — BETA-2-GLYCOPROTEIN I ABS, IGG/M/A
Beta-2 Glyco I IgG: <9 GPI IgG units (ref 0–20)
Beta-2-Glycoprotein I IgA: <9 GPI IgA units (ref 0–25)
Beta-2-Glycoprotein I IgM: <9 GPI IgM units (ref 0–32)

## 2020-07-20 LAB — MPO/PR-3 (ANCA) ANTIBODIES
ANCA Proteinase 3: 4.1 U/mL — ABNORMAL HIGH (ref 0.0–3.5)
Myeloperoxidase Abs: <9 U/mL (ref 0.0–9.0)

## 2020-07-20 LAB — ANTINUCLEAR ANTIBODIES, IFA: ANA Ab, IFA: NEGATIVE

## 2020-07-20 MED ORDER — POTASSIUM CHLORIDE CRYS ER 20 MEQ PO TBCR: 20.0000 meq | EXTENDED_RELEASE_TABLET | ORAL | Status: DC

## 2020-07-20 MED ORDER — ASPIRIN EC 325 MG PO TBEC
325.0000 mg | DELAYED_RELEASE_TABLET | Freq: Every day | ORAL | Status: DC
  Administered 2020-07-21 – 2020-08-11 (×22): 325 mg via ORAL
  Filled 2020-07-20 (×22): qty 1

## 2020-07-20 MED ORDER — PANTOPRAZOLE SODIUM 40 MG PO TBEC
40.0000 mg | DELAYED_RELEASE_TABLET | Freq: Every day | ORAL | Status: DC
  Administered 2020-07-21 – 2020-08-11 (×22): 40 mg via ORAL
  Filled 2020-07-20 (×22): qty 1

## 2020-07-20 MED ORDER — CLOPIDOGREL BISULFATE 75 MG PO TABS
75.0000 mg | ORAL_TABLET | Freq: Every day | ORAL | 11 refills | Status: DC
Start: 2020-07-21 — End: 2020-08-11

## 2020-07-20 MED ORDER — ATORVASTATIN CALCIUM 80 MG PO TABS: 80.0000 mg | ORAL_TABLET | Freq: Every day | ORAL | Status: DC

## 2020-07-20 MED ORDER — ACETAMINOPHEN 325 MG PO TABS: 650.0000 mg | ORAL_TABLET | ORAL | Status: DC | PRN

## 2020-07-20 MED ORDER — MUPIROCIN 2 % EX OINT
1.0000 "application " | TOPICAL_OINTMENT | Freq: Two times a day (BID) | CUTANEOUS | Status: AC
  Administered 2020-07-20 – 2020-07-21 (×3): 1 via NASAL
  Filled 2020-07-20: qty 22

## 2020-07-20 MED ORDER — SODIUM CHLORIDE 0.9 % IV SOLN: 50.0000 mL | INTRAVENOUS | 0 refills | Status: DC

## 2020-07-20 MED ORDER — PROCHLORPERAZINE MALEATE 5 MG PO TABS: 5.0000 mg | ORAL_TABLET | Freq: Four times a day (QID) | ORAL | Status: DC | PRN

## 2020-07-20 MED ORDER — POTASSIUM CHLORIDE CRYS ER 20 MEQ PO TBCR
20.0000 meq | EXTENDED_RELEASE_TABLET | ORAL | Status: DC
  Filled 2020-07-20: qty 1

## 2020-07-20 MED ORDER — NEBIVOLOL HCL 5 MG PO TABS
5.0000 mg | ORAL_TABLET | Freq: Every day | ORAL | Status: DC
  Administered 2020-07-21 – 2020-08-11 (×22): 5 mg via ORAL
  Filled 2020-07-20 (×23): qty 1

## 2020-07-20 MED ORDER — PROCHLORPERAZINE 25 MG RE SUPP: 12.5000 mg | Freq: Four times a day (QID) | RECTAL | Status: DC | PRN

## 2020-07-20 MED ORDER — MUPIROCIN 2 % EX OINT: 1.0000 "application " | TOPICAL_OINTMENT | Freq: Two times a day (BID) | CUTANEOUS | 0 refills | Status: DC

## 2020-07-20 MED ORDER — HEPARIN SODIUM (PORCINE) 5000 UNIT/ML IJ SOLN
5000.0000 [IU] | Freq: Three times a day (TID) | INTRAMUSCULAR | Status: DC
  Administered 2020-07-20 – 2020-08-11 (×65): 5000 [IU] via SUBCUTANEOUS
  Filled 2020-07-20 (×65): qty 1

## 2020-07-20 MED ORDER — BISACODYL 10 MG RE SUPP: 10.0000 mg | Freq: Every day | RECTAL | Status: DC | PRN

## 2020-07-20 MED ORDER — HEPARIN SODIUM (PORCINE) 5000 UNIT/ML IJ SOLN: 5000.0000 [IU] | Freq: Three times a day (TID) | INTRAMUSCULAR | Status: DC

## 2020-07-20 MED ORDER — ASPIRIN 325 MG PO TBEC: 325.0000 mg | DELAYED_RELEASE_TABLET | Freq: Every day | ORAL | 0 refills | Status: AC

## 2020-07-20 MED ORDER — TRAZODONE HCL 50 MG PO TABS: 25.0000 mg | ORAL_TABLET | Freq: Every evening | ORAL | Status: DC | PRN

## 2020-07-20 MED ORDER — ACETAMINOPHEN 325 MG PO TABS
325.0000 mg | ORAL_TABLET | ORAL | Status: DC | PRN
  Administered 2020-07-22 – 2020-07-26 (×2): 650 mg via ORAL
  Filled 2020-07-20 (×2): qty 2

## 2020-07-20 MED ORDER — AMLODIPINE BESYLATE 10 MG PO TABS
10.0000 mg | ORAL_TABLET | Freq: Every day | ORAL | Status: DC
  Administered 2020-07-21 – 2020-08-11 (×22): 10 mg via ORAL
  Filled 2020-07-20 (×22): qty 1

## 2020-07-20 MED ORDER — FLEET ENEMA 7-19 GM/118ML RE ENEM: 1.0000 | ENEMA | Freq: Once | RECTAL | Status: DC | PRN

## 2020-07-20 MED ORDER — ALUM & MAG HYDROXIDE-SIMETH 200-200-20 MG/5ML PO SUSP: 30.0000 mL | ORAL | Status: DC | PRN

## 2020-07-20 MED ORDER — ATORVASTATIN CALCIUM 80 MG PO TABS
80.0000 mg | ORAL_TABLET | Freq: Every day | ORAL | Status: DC
  Administered 2020-07-21 – 2020-08-11 (×22): 80 mg via ORAL
  Filled 2020-07-20 (×22): qty 1

## 2020-07-20 MED ORDER — DIPHENHYDRAMINE HCL 12.5 MG/5ML PO ELIX: 12.5000 mg | ORAL_SOLUTION | Freq: Four times a day (QID) | ORAL | Status: DC | PRN

## 2020-07-20 MED ORDER — GUAIFENESIN-DM 100-10 MG/5ML PO SYRP
5.0000 mL | ORAL_SOLUTION | Freq: Four times a day (QID) | ORAL | Status: DC | PRN
  Administered 2020-08-10 – 2020-08-11 (×3): 10 mL via ORAL
  Filled 2020-07-20 (×3): qty 10

## 2020-07-20 MED ORDER — POLYETHYLENE GLYCOL 3350 17 G PO PACK
17.0000 g | PACK | Freq: Every day | ORAL | Status: DC | PRN
  Administered 2020-07-20 – 2020-07-22 (×2): 17 g via ORAL
  Filled 2020-07-20 (×2): qty 1

## 2020-07-20 MED ORDER — PROCHLORPERAZINE EDISYLATE 10 MG/2ML IJ SOLN: 5.0000 mg | Freq: Four times a day (QID) | INTRAMUSCULAR | Status: DC | PRN

## 2020-07-20 NOTE — Progress Notes (Signed)
Inpatient Rehabilitation Medication Review by a Pharmacist  A complete drug regimen review was completed for this patient to identify any potential clinically significant medication issues.  Clinically significant medication issues were identified:  yes   Type of Medication Issue Identified Description of Issue Urgent (address now) Non-Urgent (address on AM team rounds) Plan Plan Accepted by Provider? (Yes / No / Pending AM Rounds)  Drug Interaction(s) (clinically significant)       Duplicate Therapy       Allergy       No Medication Administration End Date       Incorrect Dose       Additional Drug Therapy Needed  - clopidogrel (with ASA for DAPT) starting 7/29. Not yet ordered Non urgent Pharmacy to contact CIR provider on rounds pending  Other         Pharmacist comments: potential medication issue identified - discharge summary indicates "add plavix 7/29 and continue DAPT. Length of treatment per Dr. Excell Seltzer." No clopidogrel ordered yet. Pharmacy to follow up with CIR provider to ensure DAPT starts 7/29  Time spent performing this drug regimen review (minutes):  10 mins   Harlow Mares, PharmD Clinical Pharmacist  07/20/2020   4:48 PM   Please check AMION for all [facility] Pharmacy phone numbers After 10:00 PM, call the Main Pharmacy [phone]

## 2020-07-20 NOTE — Progress Notes (Signed)
Inpatient Rehabilitation Admissions Coordinator  I have insurance approval and bed for CIR admit. I met with patient and her spouse at bedside . They ar in agreement to admit. I clarified patient name as Tanya Lopez. I contacted Santiago Glad in admitting at [phone] to correct her medical record and also contacted [employer] to correct name. Spouse to call into [employer] also to correct her spelling of her last name. I contacted Burnetta Sabin, GNP, acute team and [facility] team. I will make the arrangements to admit today.  Danne Baxter, RN, MSN Rehab Admissions Coordinator [phone] 07/20/2020 10:49 AM

## 2020-07-20 NOTE — Progress Notes (Signed)
Physical Therapy Treatment Patient Details Name: Tanya Lopez MRN: [id number] DOB: Jul 20, 1971 Today's Date: 07/20/2020    History of Present Illness Pt presents with an acute onset of right sided weakness and inability to speak. MRI showed Moderate-sized acute left ACA territory infarct.  Small acute right cerebellar, left occipital, and right thalamic infarcts.  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  Husband in room throughout and CIR PA coming to assess pt for likely admission this PM.  Pt's right shoulder seems stable enough to try a R platform RW in future session.  Continue to work on balance, midline sitting and standing posture, pre gait and transfers.  Continue to assess R sided inattention.    Follow Up Recommendations  CIR     Equipment Recommendations  Wheelchair (measurements PT);Wheelchair cushion (measurements PT);Rolling walker with 5" wheels;3in1 (PT);Other (comment) (R platform RW)    Recommendations for Other Services Rehab consult     Precautions / Restrictions Precautions Precautions: Fall Precaution Comments: R sided weakness Restrictions Weight Bearing Restrictions: No    Mobility  Bed Mobility               General bed mobility comments: Pt was OOB in the recliner chair.   Transfers Overall transfer level: Needs assistance Equipment used: 2 person hand held assist Transfers: Sit to/from Stand Sit to Stand: +2 physical assistance;Mod assist         General transfer comment: Two person mod assist to stand multiple times from the recliner chair, cues for hand placement during transitions and assist at trunk and to block right leg.   Ambulation/Gait                 Stairs              Wheelchair Mobility    Modified Rankin (Stroke Patients Only) Modified Rankin (Stroke Patients Only) Pre-Morbid Rankin Score: No symptoms Modified Rankin: Severe disability     Balance Overall balance assessment: Needs assistance Sitting-balance support: Feet supported;Single extremity supported Sitting balance-Leahy Scale: Poor Sitting balance - Comments: needs UE support to pull up to sitting and maintain sitting with back off of recliner chair. R lateral lean, posterior with fatigue in her left arm.  Postural control: Posterior lean;Right lateral lean Standing balance support: Bilateral upper extremity supported Standing balance-Leahy Scale: Poor Standing balance comment: heavy mod assist in standing mostly to support pt for balance, weight shift to the left, and blocking of right knee.  Cues for upright posture, stood several times working on endurance, equal weight shift, upright posture and eventually on our last stand pre gait stepping forward and backward with her L foot.  OTA and I agreed her R should seemed stable enough to attempt a R Platform RW.                             Cognition Arousal/Alertness: Awake/alert Behavior During Therapy: Flat affect Overall Cognitive Status: Impaired/Different from baseline Area of Impairment: Following commands;Attention;Safety/judgement;Awareness;Problem solving                   Current Attention Level: Sustained   Following Commands: Follows one step commands inconsistently;Follows one step commands with increased time Safety/Judgement: Decreased awareness of safety;Decreased awareness  of deficits Awareness: Intellectual Problem Solving: Slow processing;Difficulty sequencing;Requires verbal cues;Requires tactile cues General Comments: Pt able to follow one step command, sometimes needed increased processing time or redirection to task (could get easily distracted with multiple practitioners coming into and out  of her room).       Exercises      General Comments General comments (skin integrity, edema, etc.): Seemed a bit inattentive to her right side, needs encouragement to manage right arm, recognize left leg.       Pertinent Vitals/Pain Pain Assessment: Faces Faces Pain Scale: No hurt Pain Location: RUE with PROM at elbow Pain Descriptors / Indicators: Grimacing Pain Intervention(s): Monitored during session;Repositioned;Limited activity within patient's tolerance    Home Living                      Prior Function            PT Goals (current goals can now be found in the care plan section) Acute Rehab PT Goals Patient Stated Goal: husband agreeable to rehab, supposed to d/c to CIR today Progress towards PT goals: Progressing toward goals    Frequency    Min 4X/week      PT Plan Current plan remains appropriate    Co-evaluation PT/OT/SLP Co-Evaluation/Treatment: Yes Reason for Co-Treatment: Complexity of the patient's impairments (multi-system involvement);Necessary to address cognition/behavior during functional activity;For patient/therapist safety;To address functional/ADL transfers PT goals addressed during session: Mobility/safety with mobility;Balance;Strengthening/ROM        AM-PAC PT "6 Clicks" Mobility   Outcome Measure  Help needed turning from your back to your side while in a flat bed without using bedrails?: A Lot Help needed moving from lying on your back to sitting on the side of a flat bed without using bedrails?: A Lot Help needed moving to and from a bed to a chair (including a wheelchair)?: A Lot Help needed standing up from a chair using your arms (e.g., wheelchair or bedside chair)?: A Lot Help needed to walk in hospital room?: Total Help needed climbing 3-5 steps with a railing? : Total 6 Click Score: 10    End of Session Equipment Utilized During Treatment: Gait belt Activity Tolerance: Patient tolerated treatment well Patient  left: in chair;with call bell/phone within reach;with family/visitor present   PT Visit Diagnosis: Other abnormalities of gait and mobility (R26.89);Other symptoms and signs involving the nervous system (R29.898);Hemiplegia and hemiparesis Hemiplegia - Right/Left: Right Hemiplegia - dominant/non-dominant: Dominant Hemiplegia - caused by: Nontraumatic SAH;Cerebral infarction     Time: 4562-5638 PT Time Calculation (min) (ACUTE ONLY): 22 min  Charges:  $Therapeutic Activity: 8-22 mins                     Corinna Capra, PT, DPT  Acute Rehabilitation [phone] pager [phone]) [phone] office

## 2020-07-20 NOTE — H&P (Signed)
Physical Medicine and Rehabilitation Admission H&P    Chief Complaint  Patient presents with  . Stroke with functional deficits.     HPI:  Tanya Lopez. [name] is a 49 year old female with history of HTN who was admitted on 07/16/2020 with right hemiparesis, numbness, dysarthria.  History taken from chart review and husband due to aphasia.  CT head personally reviewed, showing left frontal SAH.  Per report, focal SAH in medial posterior left frontal lobe question venous hemorrhage therefore not tPA candidate. CTA hed showed left A2 segment occlusion with incomplete filling of left frontal cortical vein question cortical vein thrombosis--angio recommended. She underwent cerebral angio by Dr.Deveshwar revealing smooth narrowing of L-ACA A2 and branches of A2 suspicious for dissection and no intraluminal filling defects noted.  MRI/MRA brain done revealing moderate L-ACA infarct, small acute right cerebellar, left occipital and right thalamic infarcts--no signifcant stenosis and improvement in L-A2 flow. Elevated BP treated with Cardene drip and she was placed on ASA daily with recommendations to repeat CTA head in one month and start DAPT in 3 days?  Lactic acidosis at admission improved with IVF. Dr. Roda Shutters felt that stroke was of unknown etiology due to dissection v/s cardiembolic source.  TEE showed it ejection fraction of 65 to 70% without any wall motion abnormalities, however positive for PFO with right to left shunting. TCD bubble study ordered to decide on loop v/s 30 day event monitor.  BLE dopplers ordered and were negative for DVT. On regular diet with thin liquids and aspiration precautions. Hospital course further complicated by hypokalemia. Therapy evaluations done revealing expressive aphasia with minimal verbal output and right flaccid hemiparesis affecting mobility and ADLs. CIR recommended due to functional decline.  Please see preadmission assessment from earlier today as  well.  Review of Systems  Unable to perform ROS: Mental acuity   Past Medical History:  Diagnosis Date  . Hypertension   . PFO (patent foramen ovale) 07/19/2020   Noted on TEE 07/19/2020    Past Surgical History:  Procedure Laterality Date  . IR ANGIO INTRA EXTRACRAN SEL COM CAROTID INNOMINATE BILAT MOD SED  07/17/2020  . IR ANGIO VERTEBRAL SEL VERTEBRAL BILAT MOD SED  07/17/2020  . IR CT HEAD LTD  07/17/2020  . RADIOLOGY WITH ANESTHESIA N/A 07/16/2020   Procedure: IR WITH ANESTHESIA;  Surgeon: Julieanne Cotton, MD;  Location: [facility] OR;  Service: Radiology;  Laterality: N/A;  . TEE WITHOUT CARDIOVERSION N/A 07/19/2020   Procedure: TRANSESOPHAGEAL ECHOCARDIOGRAM (TEE);  Surgeon: Chilton Si, MD;  Location: [facility] ENDOSCOPY;  Service: Cardiovascular;  Laterality: N/A;    Family History  Problem Relation Age of Onset  . Stroke Mother   . High blood pressure Mother   . High blood pressure Father    Social History:  Married --husband has metastatic cancer (used to work for [employer]). Her father is a retired Biochemist, clinical.  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 Per reports that she has never smoked. She has never used smokeless tobacco. Per reports that she does not drink alcohol and does not use drugs.   Allergies: No Known Allergies   Medications Prior to Admission  Medication Sig Dispense Refill  . amLODipine (NORVASC) 10 MG tablet Take 10 mg by mouth daily.    . nebivolol (BYSTOLIC) 5 MG tablet Take 5 mg by mouth daily.      Drug Regimen Review  Drug regimen was reviewed and remains appropriate with no significant issues identified  Home:  Home Living Family/patient expects to be discharged to:: Private residence Living Arrangements: Spouse/significant other (HIgh school daughter and college aged daughter) Available Help at Discharge: Family, Available 24 hours/day Type of Home: House Home Access: Stairs to enter Secretary/administratorntrance Stairs-Number of Steps:  3 Entrance Stairs-Rails: None Home Layout: One level Bathroom Shower/Tub: Psychologist, counsellingWalk-in shower, Sport and exercise psychologistDoor Bathroom Toilet: Pharmacist, communitytandard Bathroom Accessibility: Yes Home Equipment: Information systems managerhower seat - built in  Lives With: Spouse, Family   Functional History: Prior Function Level of Independence: Independent  Functional Status:  Mobility: Bed Mobility Overal bed mobility: Needs Assistance Bed Mobility: Rolling, Sidelying to Sit Rolling: Min assist Sidelying to sit: Max assist, +2 for safety/equipment General bed mobility comments: rolled to R and assist for legs off bed and trunk upright Transfers Overall transfer level: Needs assistance Equipment used: 2 person hand held assist Transfers: Sit to/from Stand, [employer] Pet[employer] CorporationStand Pivot Transfers Sit to Stand: Mod assist Stand pivot transfers: Mod assist, +2 physical assistance General transfer comment: A to block right knee for sit<>stand and stand pivot with A to move RLE as well for stand pivot bed to [facility], then transitioned to recliner via sit to stand with +1 A and another to move [facility] and place recliner Ambulation/Gait General Gait Details: NT    ADL: ADL Overall ADL's : Needs assistance/impaired Eating/Feeding: Maximal assistance Eating/Feeding Details (indicate cue type and reason): supported sitting Grooming: Maximal assistance Grooming Details (indicate cue type and reason): supported sitting Upper Body Bathing: Maximal assistance Upper Body Bathing Details (indicate cue type and reason): supported sitting Lower Body Bathing: Maximal assistance Lower Body Bathing Details (indicate cue type and reason): Mod A sit<>stand and maintain standing with right knee blocked Upper Body Dressing : Maximal assistance Upper Body Dressing Details (indicate cue type and reason): supported sitting Lower Body Dressing: Total assistance Lower Body Dressing Details (indicate cue type and reason): Mod A sit<>stand and maintain standing with right knee blocked Toilet  Transfer: Moderate assistance, +2 for physical assistance, Stand-pivot, BSC Toilet Transfer Details (indicate cue type and reason): right knee blocked and A to move RLE Toileting- Clothing Manipulation and Hygiene: Total assistance Toileting - Clothing Manipulation Details (indicate cue type and reason): Mod A sit<>stand and maintain standing with right knee blocked  Cognition: Cognition Overall Cognitive Status: Impaired/Different from baseline Arousal/Alertness: Awake/alert Orientation Level: Oriented X4 Attention: Sustained Sustained Attention: Impaired Sustained Attention Impairment: Verbal basic, Functional basic Memory:  (DTA) Awareness: Impaired Safety/Judgment: Other (comment) (DTA) Comments:  (R inattention) Cognition Arousal/Alertness: Awake/alert Behavior During Therapy: Flat affect Overall Cognitive Status: Impaired/Different from baseline General Comments: Did follow all one step commands Difficult to assess due to: Impaired communication (aphasia)   Blood pressure (!) 149/87, pulse 72, temperature 98.9 F (37.2 C), temperature source Oral, resp. rate 20, height 5\' 3"  (1.6 m), weight (!) 98.1 kg, SpO2 100 %. Physical Exam Vitals and nursing note reviewed.  Constitutional:      Appearance: Normal appearance. She is obese.  HENT:     Head: Normocephalic and atraumatic.     Right Ear: External ear normal.     Left Ear: External ear normal.     Nose: Nose normal.  Eyes:     General:        Right eye: No discharge.        Left eye: No discharge.     Extraocular Movements: Extraocular movements intact.  Cardiovascular:     Rate and Rhythm: Normal rate and regular rhythm.  Pulmonary:     Effort: Pulmonary effort is normal.  No respiratory distress.     Breath sounds: Normal breath sounds. No stridor.  Abdominal:     General: Abdomen is flat. Bowel sounds are normal.     Palpations: Abdomen is soft.  Musculoskeletal:     Cervical back: Normal range of motion and  neck supple.     Comments: No edema or tenderness in extremities  Skin:    General: Skin is warm and dry.  Neurological:     Mental Status: She is alert.     Comments: Global aphasia, ?expressive  > receptive  She is able to point and follow simple one/two step motor commands.  Does tends to nod yes to most questions.  Motor: Right hemiplegia Left upper extremity: 4 -/5 proximal distal  Left lower extremity: 4+/5 proximal to distal  Psychiatric:     Comments: Unable to assess due to aphasia     Results for orders placed or performed during the hospital encounter of 07/16/20 (from the past 48 hour(s))  CBC     Status: Abnormal   Collection Time: 07/19/20  3:00 AM  Result Value Ref Range   WBC 6.7 4.0 - 10.5 K/uL   RBC 4.56 3.87 - 5.11 MIL/uL   Hemoglobin 11.1 (L) 12.0 - 15.0 g/dL   HCT 10.2 36 - 46 %   MCV 80.7 80.0 - 100.0 fL   MCH 24.3 (L) 26.0 - 34.0 pg   MCHC 30.2 30.0 - 36.0 g/dL   RDW 72.5 (H) 36.6 - 44.0 %   Platelets 363 150 - 400 K/uL   nRBC 0.0 0.0 - 0.2 %    Comment: Performed at [facility], [address] [place], [place] [address]  Basic metabolic panel     Status: Abnormal   Collection Time: 07/19/20  3:00 AM  Result Value Ref Range   Sodium 141 135 - 145 mmol/L   Potassium 3.2 (L) 3.5 - 5.1 mmol/L   Chloride 108 98 - 111 mmol/L   CO2 23 22 - 32 mmol/L   Glucose, Bld 103 (H) 70 - 99 mg/dL    Comment: Glucose reference range applies only to samples taken after fasting for at least 8 hours.   BUN 5 (L) 6 - 20 mg/dL   Creatinine, Ser 5.95 0.44 - 1.00 mg/dL   Calcium 8.6 (L) 8.9 - 10.3 mg/dL   GFR calc non Af Amer >60 >60 mL/min   GFR calc Af Amer >60 >60 mL/min   Anion gap 10 5 - 15    Comment: Performed at [facility], [address], [place], [place] [address]  Lipid panel     Status: Abnormal   Collection Time: 07/19/20  3:00 AM  Result Value Ref Range   Cholesterol 239 (H) 0 - 200 mg/dL   Triglycerides 643 (H) <150 mg/dL   HDL 43 >32  mg/dL   Total CHOL/HDL Ratio 5.6 RATIO   VLDL 72 (H) 0 - 40 mg/dL   LDL Cholesterol 951 (H) 0 - 99 mg/dL    Comment:        Total Cholesterol/HDL:CHD Risk Coronary Heart Disease Risk Table                     Men   Women  1/2 Average Risk   3.4   3.3  Average Risk       5.0   4.4  2 X Average Risk   9.6   7.1  3 X Average  Risk  23.4   11.0        Use the calculated Patient Ratio above and the CHD Risk Table to determine the patient's CHD Risk.        ATP III CLASSIFICATION (LDL):  <100     mg/dL   Optimal  161-096  mg/dL   Near or Above                    Optimal  130-159  mg/dL   Borderline  045-409  mg/dL   High  >811     mg/dL   Very High Performed at [facility], [address] [place], [place] [address]   Lupus anticoagulant panel     Status: None   Collection Time: 07/19/20  3:00 AM  Result Value Ref Range   PTT Lupus Anticoagulant 33.9 0.0 - 51.9 sec   DRVVT 41.4 0.0 - 47.0 sec   Lupus Anticoag Interp Comment:     Comment: (NOTE) No lupus anticoagulant was detected. Performed At: [facility] [address] [place], [place] [id number] Jolene Schimke MD MV:[id number]   Triglycerides     Status: Abnormal   Collection Time: 07/19/20  3:00 AM  Result Value Ref Range   Triglycerides 361 (H) <150 mg/dL    Comment: Performed at [facility], [address], [place], [place] [address]  CBC     Status: Abnormal   Collection Time: 07/20/20  3:50 AM  Result Value Ref Range   WBC 5.9 4.0 - 10.5 K/uL   RBC 4.78 3.87 - 5.11 MIL/uL   Hemoglobin 11.5 (L) 12.0 - 15.0 g/dL   HCT 44.0 36 - 46 %   MCV 80.1 80.0 - 100.0 fL   MCH 24.1 (L) 26.0 - 34.0 pg   MCHC 30.0 30.0 - 36.0 g/dL   RDW 10.2 (H) 72.5 - 36.6 %   Platelets 334 150 - 400 K/uL   nRBC 0.0 0.0 - 0.2 %    Comment: Performed at [facility], [address], [place], [place] [address]  Basic metabolic panel     Status: Abnormal   Collection Time: 07/20/20  3:50 AM  Result Value Ref  Range   Sodium 141 135 - 145 mmol/L   Potassium 3.4 (L) 3.5 - 5.1 mmol/L   Chloride 110 98 - 111 mmol/L   CO2 23 22 - 32 mmol/L   Glucose, Bld 102 (H) 70 - 99 mg/dL    Comment: Glucose reference range applies only to samples taken after fasting for at least 8 hours.   BUN 6 6 - 20 mg/dL   Creatinine, Ser 7.42 0.44 - 1.00 mg/dL   Calcium 8.9 8.9 - 59.5 mg/dL   GFR calc non Af Amer >60 >60 mL/min   GFR calc Af Amer >60 >60 mL/min   Anion gap 8 5 - 15    Comment: Performed at [facility], [address], [place], [place] [address]  Triglycerides     Status: Abnormal   Collection Time: 07/20/20  3:50 AM  Result Value Ref Range   Triglycerides 330 (H) <150 mg/dL    Comment: Performed at [facility], [address], [place], [place] [address]   VAS [place] TRANSCRANIAL DOPPLER W BUBBLES  Result Date: 07/19/2020  Transcranial Doppler with Bubble Indications: Stroke. Performing Technologist: Blanch Media RVS  Examination Guidelines: A complete evaluation includes B-mode imaging, spectral Doppler, color Doppler, and power Doppler as  needed of all accessible portions of each vessel. Bilateral testing is considered an integral part of a complete examination. Limited examinations for reoccurring indications may be performed as noted.  Summary:  A vascular evaluation was performed. The left middle cerebral artery was studied. An IV was inserted into the patient's left forearm. Verbal informed consent was obtained.  Spencer grade 3- continuous hits on recording at rest & during valsalva. *See table(s) above for TCD measurements and observations.    Preliminary    ECHO TEE  Result Date: 07/19/2020    TRANSESOPHOGEAL ECHO REPORT   Patient Name:   Tanya Lopez Date of Exam: 07/19/2020 Medical Rec #:  [id number]                    Height:       63.0 in Accession #:    [id number]                   Weight:       216.3 lb Date of Birth:  [birth year]                     BSA:          1.999 m  Patient Age:    49 years                     BP:           164/87 mmHg Patient Gender: F                            HR:           89 bpm. Exam Location:  Inpatient Procedure: Transesophageal Echo, Color Doppler, Cardiac Doppler and Saline            Contrast Bubble Study Indications:    Stoke I63.9  History:        Patient has prior history of Echocardiogram examinations, most                 recent 07/17/2020. Risk Factors:Hypertension.  Sonographer:    Thurman Coyer RDCS (AE) Referring Phys: [phone] JILL D MCDANIEL PROCEDURE: The transesophogeal probe was passed without difficulty through the esophogus of the patient. Sedation performed by different physician. The patient was monitored while under deep sedation. Anesthestetic sedation was provided intravenously by Anesthesiology: 147.  of Propofol. The patient's vital signs; including heart rate, blood pressure, and oxygen saturation; remained stable throughout the procedure. The patient developed no complications during the procedure. IMPRESSIONS  1. Left ventricular ejection fraction, by estimation, is 65 to 70%. The left ventricle has normal function. The left ventricle has no regional wall motion abnormalities.  2. Right ventricular systolic function is normal. The right ventricular size is normal.  3. No left atrial/left atrial appendage thrombus was detected.  4. The mitral valve is normal in structure. Trivial mitral valve regurgitation. No evidence of mitral stenosis.  5. The aortic valve is tricuspid. Aortic valve regurgitation is trivial. No aortic stenosis is present.  6. Evidence of atrial level shunting detected by color flow Doppler. Agitated saline contrast bubble study was positive with shunting observed within 3-6 cardiac cycles suggestive of interatrial shunt. There is a small patent foramen ovale with predominantly right to left shunting across the atrial septum. Conclusion(s)/Recommendation(s): Normal biventricular function without evidence  of hemodynamically significant valvular heart disease. FINDINGS  Left Ventricle: Left ventricular ejection fraction, by  estimation, is 65 to 70%. The left ventricle has normal function. The left ventricle has no regional wall motion abnormalities. The left ventricular internal cavity size was normal in size. There is  no left ventricular hypertrophy. Right Ventricle: The right ventricular size is normal. No increase in right ventricular wall thickness. Right ventricular systolic function is normal. Left Atrium: Left atrial size was normal in size. No left atrial/left atrial appendage thrombus was detected. Right Atrium: Right atrial size was normal in size. Pericardium: There is no evidence of pericardial effusion. Mitral Valve: The mitral valve is normal in structure. Normal mobility of the mitral valve leaflets. Trivial mitral valve regurgitation. No evidence of mitral valve stenosis. Tricuspid Valve: The tricuspid valve is normal in structure. Tricuspid valve regurgitation is not demonstrated. No evidence of tricuspid stenosis. Aortic Valve: The aortic valve is tricuspid. Aortic valve regurgitation is trivial. No aortic stenosis is present. Pulmonic Valve: The pulmonic valve was normal in structure. Pulmonic valve regurgitation is not visualized. No evidence of pulmonic stenosis. Aorta: The aortic root is normal in size and structure. IAS/Shunts: Evidence of atrial level shunting detected by color flow Doppler. Agitated saline contrast was given intravenously to evaluate for intracardiac shunting. Agitated saline contrast bubble study was positive with shunting observed within 3-6 cardiac cycles suggestive of interatrial shunt. A small patent foramen ovale is detected with predominantly right to left shunting across the atrial septum. Chilton Si MD Electronically signed by Chilton Si MD Signature Date/Time: 07/19/2020/3:59:36 PM    Final        Medical Problem List and Plan: 1. 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   -patient may shower  -ELOS/Goals: 14-18 days/Min A  Admit to CIR 2.  Antithrombotics: -DVT/anticoagulation:  Pharmaceutical: Lovenox  -antiplatelet therapy: On ASA--?DAPT 3. Pain Management: Tylenol prn.  4. Mood: LCSW to follow for evaluation and support.   -antipsychotic agents: N/A 5. Neuropsych: This patient maybe capable of making decisions on her own behalf. 6. Skin/Wound Care: Routine pressure relief measures.  7. Fluids/Electrolytes/Nutrition: Monitor I/Os. CMP ordered. 8. HTN: Norvasc, Bystolic. Monitor BP tid.   Monitor with increased mobility. 9. GERD: On Protonix.  10. Hyperlipidemia: Trig-[id number]-->330 11. Hypokalemia: Dilution due to IVF--d/c and added supplement today.   CMP ordered 12. Stress induced hyperglycemia: Hgb A1c-5.6.   Monitor with increased mobility  Jacquelynn Cree, PA-C 07/20/2020  I have personally performed a face to face diagnostic evaluation, including, but not limited to relevant history and physical exam findings, of this patient and developed relevant assessment and plan.  Additionally, I have reviewed and concur with the physician assistant's documentation above.  Maryla Morrow, MD, ABPMR  The patient's status has not changed. The original post admission physician evaluation remains appropriate, and any changes from the pre-admission screening or documentation from the acute chart are noted above.   Maryla Morrow, MD, ABPMR

## 2020-07-20 NOTE — H&P (Addendum)
Physical Medicine and Rehabilitation Admission H&P    Chief Complaint  Patient presents with   Stroke with functional deficits.     HPI:  Tanya Felling. Lopez is a 49 year old female with history of HTN who was admitted on 07/16/2020 with right hemiparesis, numbness, dysarthria.  History taken from chart review and husband due to aphasia.  CT head personally reviewed, showing left frontal SAH.  Per report, focal SAH in medial posterior left frontal lobe question venous hemorrhage therefore not tPA candidate. CTA hed showed left A2 segment occlusion with incomplete filling of left frontal cortical vein question cortical vein thrombosis--angio recommended. She underwent cerebral angio by Dr.Deveshwar revealing smooth narrowing of L-ACA A2 and branches of A2 suspicious for dissection and no intraluminal filling defects noted.  MRI/MRA brain done revealing moderate L-ACA infarct, small acute right cerebellar, left occipital and right thalamic infarcts--no signifcant stenosis and improvement in L-A2 flow. Elevated BP treated with Cardene drip and she was placed on ASA daily with recommendations to repeat CTA head in one month and start DAPT in 3 days?  Lactic acidosis at admission improved with IVF. Dr. Roda Shutters felt that stroke was of unknown etiology due to dissection v/s cardiembolic source.  TEE showed it ejection fraction of 65 to 70% without any wall motion abnormalities, however positive for PFO with right to left shunting. TCD bubble study ordered to decide on loop v/s 30 day event monitor.  BLE dopplers ordered and were negative for DVT. On regular diet with thin liquids and aspiration precautions. Hospital course further complicated by hypokalemia. Therapy evaluations done revealing expressive aphasia with minimal verbal output and right flaccid hemiparesis affecting mobility and ADLs. CIR recommended due to functional decline.  Please see preadmission assessment from earlier today as  well.  Review of Systems  Unable to perform ROS: Mental acuity   Past Medical History:  Diagnosis Date   Hypertension    PFO (patent foramen ovale) 07/19/2020   Noted on TEE 07/19/2020    Past Surgical History:  Procedure Laterality Date   IR ANGIO INTRA EXTRACRAN SEL COM CAROTID INNOMINATE BILAT MOD SED  07/17/2020   IR ANGIO VERTEBRAL SEL VERTEBRAL BILAT MOD SED  07/17/2020   IR CT HEAD LTD  07/17/2020   RADIOLOGY WITH ANESTHESIA N/A 07/16/2020   Procedure: IR WITH ANESTHESIA;  Surgeon: Julieanne Cotton, MD;  Location: [facility] OR;  Service: Radiology;  Laterality: N/A;   TEE WITHOUT CARDIOVERSION N/A 07/19/2020   Procedure: TRANSESOPHAGEAL ECHOCARDIOGRAM (TEE);  Surgeon: Chilton Si, MD;  Location: [facility] ENDOSCOPY;  Service: Cardiovascular;  Laterality: N/A;    Family History  Problem Relation Age of Onset   Stroke Mother    High blood pressure Mother    High blood pressure Father    Social History:  Married --husband has metastatic cancer (used to work for [employer]). Her father is a retired Biochemist, clinical.  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 Per reports that she has never smoked. She has never used smokeless tobacco. Per reports that she does not drink alcohol and does not use drugs.   Allergies: No Known Allergies   Medications Prior to Admission  Medication Sig Dispense Refill   amLODipine (NORVASC) 10 MG tablet Take 10 mg by mouth daily.     nebivolol (BYSTOLIC) 5 MG tablet Take 5 mg by mouth daily.      Drug Regimen Review  Drug regimen was reviewed and remains appropriate with no significant issues identified  Home:  Home Living Family/patient expects to be discharged to:: Private residence Living Arrangements: Spouse/significant other (HIgh school daughter and college aged daughter) Available Help at Discharge: Family, Available 24 hours/day Type of Home: House Home Access: Stairs to enter Secretary/administrator of Steps:  3 Entrance Stairs-Rails: None Home Layout: One level Bathroom Shower/Tub: Psychologist, counselling, Sport and exercise psychologist: Pharmacist, community: Yes Home Equipment: Information systems manager - built in  Lives With: Spouse, Family   Functional History: Prior Function Level of Independence: Independent  Functional Status:  Mobility: Bed Mobility Overal bed mobility: Needs Assistance Bed Mobility: Rolling, Sidelying to Sit Rolling: Min assist Sidelying to sit: Max assist, +2 for safety/equipment General bed mobility comments: rolled to R and assist for legs off bed and trunk upright Transfers Overall transfer level: Needs assistance Equipment used: 2 person hand held assist Transfers: Sit to/from Stand, [employer] Transfers Sit to Stand: Mod assist Stand pivot transfers: Mod assist, +2 physical assistance General transfer comment: A to block right knee for sit<>stand and stand pivot with A to move RLE as well for stand pivot bed to [facility], then transitioned to recliner via sit to stand with +1 A and another to move [facility] and place recliner Ambulation/Gait General Gait Details: NT    ADL: ADL Overall ADL's : Needs assistance/impaired Eating/Feeding: Maximal assistance Eating/Feeding Details (indicate cue type and reason): supported sitting Grooming: Maximal assistance Grooming Details (indicate cue type and reason): supported sitting Upper Body Bathing: Maximal assistance Upper Body Bathing Details (indicate cue type and reason): supported sitting Lower Body Bathing: Maximal assistance Lower Body Bathing Details (indicate cue type and reason): Mod A sit<>stand and maintain standing with right knee blocked Upper Body Dressing : Maximal assistance Upper Body Dressing Details (indicate cue type and reason): supported sitting Lower Body Dressing: Total assistance Lower Body Dressing Details (indicate cue type and reason): Mod A sit<>stand and maintain standing with right knee blocked Toilet  Transfer: Moderate assistance, +2 for physical assistance, Stand-pivot, BSC Toilet Transfer Details (indicate cue type and reason): right knee blocked and A to move RLE Toileting- Clothing Manipulation and Hygiene: Total assistance Toileting - Clothing Manipulation Details (indicate cue type and reason): Mod A sit<>stand and maintain standing with right knee blocked  Cognition: Cognition Overall Cognitive Status: Impaired/Different from baseline Arousal/Alertness: Awake/alert Orientation Level: Oriented X4 Attention: Sustained Sustained Attention: Impaired Sustained Attention Impairment: Verbal basic, Functional basic Memory:  (DTA) Awareness: Impaired Safety/Judgment: Other (comment) (DTA) Comments:  (R inattention) Cognition Arousal/Alertness: Awake/alert Behavior During Therapy: Flat affect Overall Cognitive Status: Impaired/Different from baseline General Comments: Did follow all one step commands Difficult to assess due to: Impaired communication (aphasia)   Blood pressure (!) 149/87, pulse 72, temperature 98.9 F (37.2 C), temperature source Oral, resp. rate 20, height 5\' 3"  (1.6 m), weight (!) 98.1 kg, SpO2 100 %. Physical Exam Vitals and nursing note reviewed.  Constitutional:      Appearance: Normal appearance. She is obese.  HENT:     Head: Normocephalic and atraumatic.     Right Ear: External ear normal.     Left Ear: External ear normal.     Nose: Nose normal.  Eyes:     General:        Right eye: No discharge.        Left eye: No discharge.     Extraocular Movements: Extraocular movements intact.  Cardiovascular:     Rate and Rhythm: Normal rate and regular rhythm.  Pulmonary:     Effort: Pulmonary effort is normal.  No respiratory distress.     Breath sounds: Normal breath sounds. No stridor.  Abdominal:     General: Abdomen is flat. Bowel sounds are normal.     Palpations: Abdomen is soft.  Musculoskeletal:     Cervical back: Normal range of motion and  neck supple.     Comments: No edema or tenderness in extremities  Skin:    General: Skin is warm and dry.  Neurological:     Mental Status: She is alert.     Comments: Global aphasia, ?expressive  > receptive  She is able to point and follow simple one/two step motor commands.  Does tends to nod yes to most questions.  Motor: Right hemiplegia Left upper extremity: 4 -/5 proximal distal  Left lower extremity: 4+/5 proximal to distal  Psychiatric:     Comments: Unable to assess due to aphasia     Results for orders placed or performed during the hospital encounter of 07/16/20 (from the past 48 hour(s))  CBC     Status: Abnormal   Collection Time: 07/19/20  3:00 AM  Result Value Ref Range   WBC 6.7 4.0 - 10.5 K/uL   RBC 4.56 3.87 - 5.11 MIL/uL   Hemoglobin 11.1 (L) 12.0 - 15.0 g/dL   HCT 40.9 36 - 46 %   MCV 80.7 80.0 - 100.0 fL   MCH 24.3 (L) 26.0 - 34.0 pg   MCHC 30.2 30.0 - 36.0 g/dL   RDW 81.1 (H) 91.4 - 78.2 %   Platelets 363 150 - 400 K/uL   nRBC 0.0 0.0 - 0.2 %    Comment: Performed at [facility], [address], [place], [place] [address]  Basic metabolic panel     Status: Abnormal   Collection Time: 07/19/20  3:00 AM  Result Value Ref Range   Sodium 141 135 - 145 mmol/L   Potassium 3.2 (L) 3.5 - 5.1 mmol/L   Chloride 108 98 - 111 mmol/L   CO2 23 22 - 32 mmol/L   Glucose, Bld 103 (H) 70 - 99 mg/dL    Comment: Glucose reference range applies only to samples taken after fasting for at least 8 hours.   BUN 5 (L) 6 - 20 mg/dL   Creatinine, Ser 3.08 0.44 - 1.00 mg/dL   Calcium 8.6 (L) 8.9 - 10.3 mg/dL   GFR calc non Af Amer >60 >60 mL/min   GFR calc Af Amer >60 >60 mL/min   Anion gap 10 5 - 15    Comment: Performed at [facility], [address], [place], [place] [address]  Lipid panel     Status: Abnormal   Collection Time: 07/19/20  3:00 AM  Result Value Ref Range   Cholesterol 239 (H) 0 - 200 mg/dL   Triglycerides 696 (H) <150 mg/dL   HDL 43 >29  mg/dL   Total CHOL/HDL Ratio 5.6 RATIO   VLDL 72 (H) 0 - 40 mg/dL   LDL Cholesterol 528 (H) 0 - 99 mg/dL    Comment:        Total Cholesterol/HDL:CHD Risk Coronary Heart Disease Risk Table                     Men   Women  1/2 Average Risk   3.4   3.3  Average Risk       5.0   4.4  2 X Average Risk   9.6   7.1  3 X Average  Risk  23.4   11.0        Use the calculated Patient Ratio above and the CHD Risk Table to determine the patient's CHD Risk.        ATP III CLASSIFICATION (LDL):  <100     mg/dL   Optimal  098-119100-129  mg/dL   Near or Above                    Optimal  130-159  mg/dL   Borderline  [id number]  mg/dL   High  >562>190     mg/dL   Very High Performed at [facility], [address] [place], [place] [address]   Lupus anticoagulant panel     Status: None   Collection Time: 07/19/20  3:00 AM  Result Value Ref Range   PTT Lupus Anticoagulant 33.9 0.0 - 51.9 sec   DRVVT 41.4 0.0 - 47.0 sec   Lupus Anticoag Interp Comment:     Comment: (NOTE) No lupus anticoagulant was detected. Performed At: [facility] [address] [place], [place] [id number] Jolene SchimkeNagendra Sanjai MD BM:[id number]:[phone]   Triglycerides     Status: Abnormal   Collection Time: 07/19/20  3:00 AM  Result Value Ref Range   Triglycerides 361 (H) <150 mg/dL    Comment: Performed at [facility], [address] [place], [place] [address]  CBC     Status: Abnormal   Collection Time: 07/20/20  3:50 AM  Result Value Ref Range   WBC 5.9 4.0 - 10.5 K/uL   RBC 4.78 3.87 - 5.11 MIL/uL   Hemoglobin 11.5 (L) 12.0 - 15.0 g/dL   HCT 66.438.3 36 - 46 %   MCV 80.1 80.0 - 100.0 fL   MCH 24.1 (L) 26.0 - 34.0 pg   MCHC 30.0 30.0 - 36.0 g/dL   RDW 40.319.9 (H) 47.411.5 - 25.915.5 %   Platelets 334 150 - 400 K/uL   nRBC 0.0 0.0 - 0.2 %    Comment: Performed at [facility], [address] [place], [place] [address]  Basic metabolic panel     Status: Abnormal   Collection Time: 07/20/20  3:50 AM  Result Value Ref  Range   Sodium 141 135 - 145 mmol/L   Potassium 3.4 (L) 3.5 - 5.1 mmol/L   Chloride 110 98 - 111 mmol/L   CO2 23 22 - 32 mmol/L   Glucose, Bld 102 (H) 70 - 99 mg/dL    Comment: Glucose reference range applies only to samples taken after fasting for at least 8 hours.   BUN 6 6 - 20 mg/dL   Creatinine, Ser 5.640.61 0.44 - 1.00 mg/dL   Calcium 8.9 8.9 - 33.210.3 mg/dL   GFR calc non Af Amer >60 >60 mL/min   GFR calc Af Amer >60 >60 mL/min   Anion gap 8 5 - 15    Comment: Performed at [facility], [address] [place], [place] [address]  Triglycerides     Status: Abnormal   Collection Time: 07/20/20  3:50 AM  Result Value Ref Range   Triglycerides 330 (H) <150 mg/dL    Comment: Performed at [facility], [address] [place], [place] [address]   VAS US TRANSCRANIAL DOPPLER W BUBBLES  Result Date: 07/19/2020  Transcranial Doppler with Bubble Indications: Stroke. Performing Technologist: Blanch MediaMegan Riddle RVS  Examination Guidelines: A complete evaluation includes B-mode imaging, spectral Doppler, color Doppler, and power Doppler as  needed of all accessible portions of each vessel. Bilateral testing is considered an integral part of a complete examination. Limited examinations for reoccurring indications may be performed as noted.  Summary:  A vascular evaluation was performed. The left middle cerebral artery was studied. An IV was inserted into the patient's left forearm. Verbal informed consent was obtained.  Spencer grade 3- continuous hits on recording at rest & during valsalva. *See table(s) above for TCD measurements and observations.    Preliminary    ECHO TEE  Result Date: 07/19/2020    TRANSESOPHOGEAL ECHO REPORT   Patient Name:   Tanya Lopez Date of Exam: 07/19/2020 Medical Rec #:  [id number]                    Height:       63.0 in Accession #:    [id number]                   Weight:       216.3 lb Date of Birth:  06/22/1971                     BSA:          1.999 m  Patient Age:    49 years                     BP:           164/87 mmHg Patient Gender: F                            HR:           89 bpm. Exam Location:  Inpatient Procedure: Transesophageal Echo, Color Doppler, Cardiac Doppler and Saline            Contrast Bubble Study Indications:    Stoke I63.9  History:        Patient has prior history of Echocardiogram examinations, most                 recent 07/17/2020. Risk Factors:Hypertension.  Sonographer:    Thurman Coyer RDCS (AE) Referring Phys: [phone] JILL D MCDANIEL PROCEDURE: The transesophogeal probe was passed without difficulty through the esophogus of the patient. Sedation performed by different physician. The patient was monitored while under deep sedation. Anesthestetic sedation was provided intravenously by Anesthesiology: 147.53mg  of Propofol. The patient's vital signs; including heart rate, blood pressure, and oxygen saturation; remained stable throughout the procedure. The patient developed no complications during the procedure. IMPRESSIONS  1. Left ventricular ejection fraction, by estimation, is 65 to 70%. The left ventricle has normal function. The left ventricle has no regional wall motion abnormalities.  2. Right ventricular systolic function is normal. The right ventricular size is normal.  3. No left atrial/left atrial appendage thrombus was detected.  4. The mitral valve is normal in structure. Trivial mitral valve regurgitation. No evidence of mitral stenosis.  5. The aortic valve is tricuspid. Aortic valve regurgitation is trivial. No aortic stenosis is present.  6. Evidence of atrial level shunting detected by color flow Doppler. Agitated saline contrast bubble study was positive with shunting observed within 3-6 cardiac cycles suggestive of interatrial shunt. There is a small patent foramen ovale with predominantly right to left shunting across the atrial septum. Conclusion(s)/Recommendation(s): Normal biventricular function without evidence  of hemodynamically significant valvular heart disease. FINDINGS  Left Ventricle: Left ventricular ejection fraction, by  estimation, is 65 to 70%. The left ventricle has normal function. The left ventricle has no regional wall motion abnormalities. The left ventricular internal cavity size was normal in size. There is  no left ventricular hypertrophy. Right Ventricle: The right ventricular size is normal. No increase in right ventricular wall thickness. Right ventricular systolic function is normal. Left Atrium: Left atrial size was normal in size. No left atrial/left atrial appendage thrombus was detected. Right Atrium: Right atrial size was normal in size. Pericardium: There is no evidence of pericardial effusion. Mitral Valve: The mitral valve is normal in structure. Normal mobility of the mitral valve leaflets. Trivial mitral valve regurgitation. No evidence of mitral valve stenosis. Tricuspid Valve: The tricuspid valve is normal in structure. Tricuspid valve regurgitation is not demonstrated. No evidence of tricuspid stenosis. Aortic Valve: The aortic valve is tricuspid. Aortic valve regurgitation is trivial. No aortic stenosis is present. Pulmonic Valve: The pulmonic valve was normal in structure. Pulmonic valve regurgitation is not visualized. No evidence of pulmonic stenosis. Aorta: The aortic root is normal in size and structure. IAS/Shunts: Evidence of atrial level shunting detected by color flow Doppler. Agitated saline contrast was given intravenously to evaluate for intracardiac shunting. Agitated saline contrast bubble study was positive with shunting observed within 3-6 cardiac cycles suggestive of interatrial shunt. A small patent foramen ovale is detected with predominantly right to left shunting across the atrial septum. Chilton Si MD Electronically signed by Chilton Si MD Signature Date/Time: 07/19/2020/3:59:36 PM    Final        Medical Problem List and Plan: 1. 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   -patient may shower  -ELOS/Goals: 14-18 days/Min A  Admit to CIR 2.  Antithrombotics: -DVT/anticoagulation:  Pharmaceutical: Lovenox  -antiplatelet therapy: On ASA--?DAPT 3. Pain Management: Tylenol prn.  4. Mood: LCSW to follow for evaluation and support.   -antipsychotic agents: N/A 5. Neuropsych: This patient maybe capable of making decisions on her own behalf. 6. Skin/Wound Care: Routine pressure relief measures.  7. Fluids/Electrolytes/Nutrition: Monitor I/Os. CMP ordered. 8. HTN: Norvasc, Bystolic. Monitor BP tid.   Monitor with increased mobility. 9. GERD: On Protonix.  10. Hyperlipidemia: Trig-[id number]-->330 11. Hypokalemia: Dilution due to IVF--d/c and added supplement today.   CMP ordered 12. Stress induced hyperglycemia: Hgb A1c-5.6.   Monitor with increased mobility  Jacquelynn Cree, PA-C 07/20/2020  I have personally performed a face to face diagnostic evaluation, including, but not limited to relevant history and physical exam findings, of this patient and developed relevant assessment and plan.  Additionally, I have reviewed and concur with the physician assistant's documentation above.  Maryla Morrow, MD, ABPMR

## 2020-07-20 NOTE — Progress Notes (Signed)
Marcello FennelPatel, Ankit Anil, MD  Physician  Physical Medicine and Rehabilitation  PMR Pre-admission      Addendum  Date of Service:  07/19/2020  2:29 PM      Related encounter: ED to Hosp-Admission (Current) from 07/16/2020 in [facility]       Show:Clear all [x] Manual[x] Template[x] Copied  Added by: [x] Standley BrookingBoyette, Liat Mayol G, RN  [] Hover for details PMR Admission Coordinator Pre-Admission Assessment   Patient: Tanya Lopez is an 49 y.o., female MRN: [id number] DOB: 09/06/1971 Height: 5\' 3"  (160 cm) Weight: (!) 98.1 kg    Patient's name is correctly spelled as Tanya Lopez ( admitting and [employer]BS notified of correct spelling to be corrected on 7/28)                                                                                                               Insurance Information HMO:     PPO: yes     PCP:      IPA:      80/20:      OTHER:  PRIMARY: [employer] [place]      Policy#: [id number]      Subscriber: spouse CM Name: fax approval only      Phone#: [phone]     Fax#: [phone] Pre-Cert#: [id number] approved until 8/4 when updates are due      Employer: [employer]:  Phone #: [phone]     Name: 7/28 Eff. Date: 11/24/2019     Deduct: $5000      Out of Pocket Max: [phone] includes deductible      Life Max: none  CIR: 50%      SNF: 70% limited to 60 days per year Outpatient: 70%     Co-Pay: 30 visits combined Home Health: 70%      Co-Pay: 30% DME: 50%     Co-Pay: 50% Providers: in network  SECONDARY: none   Financial Counselor:       Phone#:    The Data processing manager"Data Collection Information Summary" for patients in Inpatient Rehabilitation Facilities with attached "Privacy Act Statement-Health Care Records" was provided and verbally reviewed with: N/A   Emergency Contact Information         Contact Information     Name Relation Home Work Mobile    Pearson GrippeKim, James Spouse     [phone]       Current Medical History  Patient Admitting Diagnosis: CVA     History of Present Illness: 49 y.o. right-handed female with history of hypertension.  Presented 07/16/2020 with acute onset of left-sided weakness and aphasia.  Cranial CT scan showed focal subarachnoid hemorrhage in the medial posterior left frontal lobe.  CT angiogram of head and neck showed left A2 segment occlusion with poor reconstitution of distal branch vessels.  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  Patient did not receive TPA.  Admission chemistries unremarkable, urinalysis negative nitrite, lactic acid 2.5.  Cerebral angiogram completed showing smooth areas of narrowing of left ACA A2  and branches of A2 suspicious for possible dissection but no cortical thrombosis.  Plan currently is to follow-up with interventional radiology 1 month for repeat CTA of head and neck.  Patient did initially require ventilatory support and extubated 07/17/2020.  Lower extremity Dopplers negative for DVT.  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  Small acute right cerebellar left occipital and right thalamic infarct.  Motion degraded head MRA with apparent improved left A2 segment flow compared to prior CTA.  Maintained on Cardene for blood pressure control.  Echocardiogram with ejection fraction of 75% no wall motion abnormalities. Patient currently maintained on aspirin 325 mg daily for CVA prophylaxis.  Subcutaneous heparin for DVT prophylaxis. TEE 7/27 positive PFO with right to left shunt at rest. Plans for 30 day Holter monitoring as an outpatient and follow up with Dr. Excell Seltzer for assessment of PFO closure.   Complete NIHSS TOTAL: 10 Glasgow Coma Scale Score: 15   Past Medical History      Past Medical History:  Diagnosis Date  . Hypertension    . PFO (patent foramen ovale) 07/19/2020    Noted on TEE 07/19/2020      Family History  family history is not on file.   Prior Rehab/Hospitalizations:  Has the patient  had prior rehab or hospitalizations prior to admission? Yes   Has the patient had major surgery during 100 days prior to admission? No   Current Medications    Current Facility-Administered Medications:  .   stroke: mapping our early stages of recovery book, , Does not apply, Once, Rejeana Brock, MD .  0.9 %  sodium chloride infusion, , Intravenous, Continuous, Marvel Plan, MD, Last Rate: 50 mL/hr at 07/20/20 0326, New Bag at 07/20/20 0326 .  acetaminophen (TYLENOL) tablet 650 mg, 650 mg, Oral, Q4H PRN **OR** acetaminophen (TYLENOL) 160 MG/5ML solution 650 mg, 650 mg, Per Tube, Q4H PRN **OR** acetaminophen (TYLENOL) suppository 650 mg, 650 mg, Rectal, Q4H PRN, Rejeana Brock, MD, 650 mg at 07/17/20 2017 .  amLODipine (NORVASC) tablet 10 mg, 10 mg, Oral, Daily, Marvel Plan, MD, 10 mg at 07/20/20 1026 .  aspirin EC tablet 325 mg, 325 mg, Oral, Daily, Marvel Plan, MD, 325 mg at 07/20/20 1027 .  atorvastatin (LIPITOR) tablet 80 mg, 80 mg, Oral, Daily, Marvel Plan, MD, 80 mg at 07/20/20 1026 .  Chlorhexidine Gluconate Cloth 2 % PADS 6 each, 6 each, Topical, Q0600, Karl Ito, MD, 6 each at 07/19/20 2151 .  heparin injection 5,000 Units, 5,000 Units, Subcutaneous, Q8H, Marvel Plan, MD, 5,000 Units at 07/20/20 0617 .  iohexol (OMNIPAQUE) 300 MG/ML solution 150 mL, 150 mL, Intra-arterial, Once PRN, Deveshwar, Sanjeev, MD .  iohexol (OMNIPAQUE) 300 MG/ML solution 150 mL, 150 mL, Intra-arterial, Once PRN, Deveshwar, Sanjeev, MD .  labetalol (NORMODYNE) injection 5-20 mg, 5-20 mg, Intravenous, Q2H PRN, Marvel Plan, MD, 10 mg at 07/19/20 0107 .  mupirocin ointment (BACTROBAN) 2 % 1 application, 1 application, Nasal, BID, Rejeana Brock, MD, 1 application at 07/20/20 1027 .  nebivolol (BYSTOLIC) tablet 5 mg, 5 mg, Oral, Daily, Marvel Plan, MD, 5 mg at 07/20/20 1026 .  ondansetron (ZOFRAN) injection 4 mg, 4 mg, Intravenous, Q6H PRN, Gleason, Darcella Gasman, PA-C, 4 mg at 07/17/20  1030 .  pantoprazole (PROTONIX) EC tablet 40 mg, 40 mg, Oral, Daily, Marvel Plan, MD, 40 mg at 07/20/20 1027 .  senna-docusate (Senokot-S) tablet 1 tablet, 1 tablet, Oral, Once, Rejeana Brock, MD .  sodium  chloride flush (NS) 0.9 % injection 3 mL, 3 mL, Intravenous, Once, Jacalyn Lefevre, MD   Patients Current Diet:     Diet Order                      Diet Heart Room service appropriate? Yes with Assist; Fluid consistency: Thin  Diet effective now                      Precautions / Restrictions Precautions Precautions: Fall Precaution Comments: flaccid right side Restrictions Weight Bearing Restrictions: No    Has the patient had 2 or more falls or a fall with injury in the past year?No   Prior Activity Level Community (5-7x/wk): Independent, active, driving, does photography on the side   Prior Functional Level Prior Function Level of Independence: Independent   Self Care: Did the patient need help bathing, dressing, using the toilet or eating?  Independent   Indoor Mobility: Did the patient need assistance with walking from room to room (with or without device)? Independent   Stairs: Did the patient need assistance with internal or external stairs (with or without device)? Independent   Functional Cognition: Did the patient need help planning regular tasks such as shopping or remembering to take medications? Independent   Home Assistive Devices / Equipment Home Assistive Devices/Equipment: Eyeglasses Home Equipment: Shower seat - built in   Prior Device Use: Indicate devices/aids used by the patient prior to current illness, exacerbation or injury? None of the above   Current Functional Level Cognition   Arousal/Alertness: Awake/alert Overall Cognitive Status: Impaired/Different from baseline Difficult to assess due to: Impaired communication (aphasia) Orientation Level: Oriented X4 General Comments: Did follow all one step commands Attention:  Sustained Sustained Attention: Impaired Sustained Attention Impairment: Verbal basic, Functional basic Memory:  (DTA) Awareness: Impaired Safety/Judgment: Other (comment) (DTA) Comments:  (R inattention)    Extremity Assessment (includes Sensation/Coordination)   Upper Extremity Assessment: Defer to OT evaluation RUE Deficits / Details: Flaccid, PROM WNL, no sublux noted RUE Coordination: decreased fine motor, decreased gross motor  Lower Extremity Assessment: RLE deficits/detail RLE Deficits / Details: no active movement illicited with mobility nor to command RLE Coordination: decreased fine motor, decreased gross motor     ADLs   Overall ADL's : Needs assistance/impaired Eating/Feeding: Maximal assistance Eating/Feeding Details (indicate cue type and reason): supported sitting Grooming: Maximal assistance Grooming Details (indicate cue type and reason): supported sitting Upper Body Bathing: Maximal assistance Upper Body Bathing Details (indicate cue type and reason): supported sitting Lower Body Bathing: Maximal assistance Lower Body Bathing Details (indicate cue type and reason): Mod A sit<>stand and maintain standing with right knee blocked Upper Body Dressing : Maximal assistance Upper Body Dressing Details (indicate cue type and reason): supported sitting Lower Body Dressing: Total assistance Lower Body Dressing Details (indicate cue type and reason): Mod A sit<>stand and maintain standing with right knee blocked Toilet Transfer: Moderate assistance, +2 for physical assistance, Stand-pivot, BSC Toilet Transfer Details (indicate cue type and reason): right knee blocked and A to move RLE Toileting- Clothing Manipulation and Hygiene: Total assistance Toileting - Clothing Manipulation Details (indicate cue type and reason): Mod A sit<>stand and maintain standing with right knee blocked     Mobility   Overal bed mobility: Needs Assistance Bed Mobility: Rolling, Sidelying to  Sit Rolling: Min assist Sidelying to sit: Max assist, +2 for safety/equipment General bed mobility comments: rolled to R and assist for legs off bed and trunk  upright     Transfers   Overall transfer level: Needs assistance Equipment used: 2 person hand held assist Transfers: Sit to/from Stand, Stand Pivot Transfers Sit to Stand: Mod assist Stand pivot transfers: Mod assist, +2 physical assistance General transfer comment: A to block right knee for sit<>stand and stand pivot with A to move RLE as well for stand pivot bed to [facility], then transitioned to recliner via sit to stand with +1 A and another to move [facility] and place recliner     Ambulation / Gait / Stairs / Wheelchair Mobility   Ambulation/Gait General Gait Details: NT     Posture / Balance Dynamic Sitting Balance Sitting balance - Comments: tendency for right and posterior lateral lean with min A to correct Balance Overall balance assessment: Needs assistance Sitting-balance support: Feet supported, Single extremity supported Sitting balance-Leahy Scale: Poor Sitting balance - Comments: tendency for right and posterior lateral lean with min A to correct Standing balance support: Single extremity supported Standing balance-Leahy Scale: Zero     Special needs/care consideration Designated visitors are Fayrene Fearing, spouse and second visitor to be determined Spouse is an MD on medical leave currently due to his Cancer treatments Indwelling catheter double lumen 14 fr placed 7/26    Previous Home Environment  Living Arrangements: Spouse/significant other ([employer] school daughter and college aged daughter)  Lives With: Spouse, Family Available Help at Discharge: Family, Available 24 hours/day Type of Home: House Home Layout: One level Home Access: Stairs to enter Entrance Stairs-Rails: None Secretary/administrator of Steps: 3 Bathroom Shower/Tub: Psychologist, counselling, Sport and exercise psychologist: Standard Bathroom Accessibility: Yes How Accessible:  Accessible via walker Home Care Services: No   Discharge Living Setting Plans for Discharge Living Setting: Patient's home, Lives with (comment) (spouse and two daughters) Type of Home at Discharge: House Discharge Home Layout: One level Discharge Home Access: Stairs to enter Entrance Stairs-Rails: None Entrance Stairs-Number of Steps: 3 Discharge Bathroom Shower/Tub: Psychologist, counselling, Door Discharge Bathroom Toilet: Standard Discharge Bathroom Accessibility: Yes How Accessible: Accessible via walker Does the patient have any problems obtaining your medications?: No   Social/Family/Support Systems Patient Roles: Spouse, Parent Contact Information: spouse, Pearson Grippe Anticipated Caregiver: spouse, pt's sister from [place] and/or paretns in [place] to assist Anticipated Caregiver's Contact Information: [phone] Ability/Limitations of Caregiver: spouse on leave from work after undergoing Colon cancer and treatments postoperatively Caregiver Availability: 24/7 Discharge Plan Discussed with Primary Caregiver: Yes Is Caregiver In Agreement with Plan?: Yes Does Caregiver/Family have Issues with Lodging/Transportation while Pt is in Rehab?: No   Goals Patient/Family Goal for Rehab: min asisst with PT, OT, and SLP Expected length of stay: ELOS 20 to 22 days Pt/Family Agrees to Admission and willing to participate: Yes Program Orientation Provided & Reviewed with Pt/Caregiver Including Roles  & Responsibilities: Yes   Decrease burden of Care through IP rehab admission: n/a   Possible need for SNF placement upon discharge:not anticipated   Patient Condition: This patient's condition remains as documented in the consult dated 07/19/2020, in which the Rehabilitation Physician determined and documented that the patient's condition is appropriate for intensive rehabilitative care in an inpatient rehabilitation facility. Will admit to inpatient rehab today.   Preadmission Screen Completed By:   Clois Dupes, RN, 07/20/2020 10:57 AM ______________________________________________________________________   Discussed status with Dr. Allena Katz on 07/20/2020 at 1058 and received approval for admission today.   Admission Coordinator:  Clois Dupes, time 1914 Date 07/20/2020         Revision History  Note Details  Author Allena Katz, Maryln Gottron, MD File Time 07/20/2020 11:00 AM  Author Type Physician Status Addendum  Last Editor Marcello Fennel, MD Service Physical Medicine and Rehabilitation

## 2020-07-20 NOTE — Progress Notes (Signed)
Occupational Therapy Treatment Patient Details Name: Tanya Lopez MRN: [id number] DOB: Oct 05, 1971 Today's Date: 07/20/2020    History of present illness Pt presents with an acute onset of right sided weakness and inability to speak. MRI showed Moderate-sized acute left ACA territory infarct.  Small acute right cerebellar, left occipital, and right thalamic infarcts.  CTA-L A2 segment occlusion; Angiogram shows likely dissection, no cortical thrombosis.On vent--extubated 7/25   OT comments  Patient continues to make steady progress towards goals in skilled OT session. Patient's session encompassed neuromuscular re-education focusing on RUE. Pt is currently able to squeeze therapists fingers minimally, however is unable to elevate shoulder or flex or extend at elbow. Table placed in front of pt with washcloth under RUE, facilitation provided at R elbow to attempt to slide hand forward and backward (3 sessions for 3 reps). Pt demonstrated trace movement, however was compensating with L side by gripping opposite side of the table. Pt then worked on crossing midline with therapist with hand over hand assist to slide washcloth across table (3 sessions for 3 reps). Pt noted to fatigue quickly with movement, but is extremely motivated to participate in therapy. Discharge remains appropriate at this time, will continue to follow acutely.    Follow Up Recommendations  CIR;Supervision/Assistance - 24 hour    Equipment Recommendations  Other (comment) (Defer to next venue)    Recommendations for Other Services      Precautions / Restrictions Precautions Precautions: Fall Precaution Comments: flaccid right side Restrictions Weight Bearing Restrictions: No       Mobility Bed Mobility               General bed mobility comments: In chair upon arrival  Transfers                      Balance Overall balance assessment: Needs assistance Sitting-balance support: Feet  supported;Single extremity supported Sitting balance-Leahy Scale: Poor Sitting balance - Comments: tendency for right and posterior lateral lean with min A to correct                                   ADL either performed or assessed with clinical judgement   ADL                                       Functional mobility during ADLs: +2 for physical assistance;+2 for safety/equipment;Maximal assistance General ADL Comments: Session focus on neuromuscular re-ed with RUE     Vision       Perception     Praxis      Cognition Arousal/Alertness: Awake/alert Behavior During Therapy: WFL for tasks assessed/performed Overall Cognitive Status: Within Functional Limits for tasks assessed                                 General Comments: Able to follow one step commands, spoke minimally in session but was making effort to communicate        Exercises     Shoulder Instructions       General Comments      Pertinent Vitals/ Pain       Pain Assessment: Faces Faces Pain Scale: Hurts a little bit Pain Location: RUE with PROM at elbow Pain Descriptors / Indicators:  Grimacing Pain Intervention(s): Monitored during session;Repositioned;Limited activity within patient's tolerance  Home Living                                          Prior Functioning/Environment              Frequency  Min 3X/week        Progress Toward Goals  OT Goals(current goals can now be found in the care plan section)  Progress towards OT goals: Progressing toward goals  Acute Rehab OT Goals Patient Stated Goal: husband (agrees she needs rehab) OT Goal Formulation: With patient/family Time For Goal Achievement: 08/01/20 Potential to Achieve Goals: Good  Plan Discharge plan remains appropriate    Co-evaluation                 AM-PAC OT "6 Clicks" Daily Activity     Outcome Measure   Help from another person eating  meals?: A Lot Help from another person taking care of personal grooming?: A Lot Help from another person toileting, which includes using toliet, bedpan, or urinal?: A Lot Help from another person bathing (including washing, rinsing, drying)?: A Lot Help from another person to put on and taking off regular upper body clothing?: A Lot Help from another person to put on and taking off regular lower body clothing?: Total 6 Click Score: 11    End of Session Equipment Utilized During Treatment: Gait belt  OT Visit Diagnosis: Unsteadiness on feet (R26.81);Other abnormalities of gait and mobility (R26.89);Muscle weakness (generalized) (M62.81);Other symptoms and signs involving cognitive function;Cognitive communication deficit (R41.841);Hemiplegia and hemiparesis Symptoms and signs involving cognitive functions: Cerebral infarction Hemiplegia - Right/Left: Right Hemiplegia - dominant/non-dominant: Dominant Hemiplegia - caused by: Cerebral infarction   Activity Tolerance Patient tolerated treatment well   Patient Left in chair;with call bell/phone within reach;with chair alarm set;with family/visitor present   Nurse Communication Mobility status        Time: 5329-9242 OT Time Calculation (min): 16 min  Charges: OT General Charges $OT Visit: 1 Visit OT Treatments $Neuromuscular Re-education: 8-22 mins  Pollyann Glen E. Shenekia Riess, COTA/L Acute Rehabilitation Services [phone]   Cherlyn Cushing 07/20/2020, 1:53 PM

## 2020-07-20 NOTE — Discharge Summary (Addendum)
Stroke Discharge Summary  Patient ID: Tanya Lopez   MRN: [id number]      DOB: [birth year]  Date of Admission: 07/16/2020 Date of Discharge: 07/20/2020  Attending Physician:  Marvel Plan, MD, Stroke MD Consultant(s):  Roxanne Gates) Corliss Skains, MD (Interventional Neuroradiologist), Virl Diamond, MD (pulmonary/intensive care), Sula Soda, MD (Physical Medicine & Rehabilitation)   Patient's PCP:  Patient, No Pcp Per  DISCHARGE DIAGNOSIS:  Principal Problem:   Acute L ACA, R cerebellar, L temporoparietal infarcts (HCC)  Active Problems:   Neurologic abnormality   PFO (patent foramen ovale)   SAH (subarachnoid hemorrhage) (HCC) - L frontal parafalcine, present on admission w/ infarcts   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   amLODipine 10 MG tablet Commonly known as: NORVASC Take 10 mg by mouth daily.   aspirin 325 MG EC tablet Take 1 tablet (325 mg total) by mouth daily. Start taking on: July 21, 2020   atorvastatin 80 MG tablet Commonly known as: LIPITOR Take 1 tablet (80 mg total) by mouth daily. Start taking on: July 21, 2020   clopidogrel 75 MG tablet Commonly known as: Plavix Take 1 tablet (75 mg total) by mouth daily for 3 days. Start taking on: July 21, 2020   heparin 5000 UNIT/ML injection Inject 1 mL (5,000 Units total) into the skin every 8 (eight) hours.   mupirocin ointment 2 % Commonly known as: BACTROBAN Place 1 application into the nose 2 (two) times daily.   nebivolol 5 MG tablet Commonly known as: BYSTOLIC Take 5 mg by mouth daily.   potassium chloride SA 20  MEQ tablet Commonly known as: KLOR-CON Take 1 tablet (20 mEq total) by mouth every 4 (four) hours for 2 doses.   sodium chloride 0.9 % infusion Inject 50 mLs into the vein continuous.            Discharge Care Instructions  (From admission, onward)         Start     Ordered   07/20/20 0000  Discharge wound care:       Comments: As per AVS   07/20/20 1312          LABORATORY STUDIES CBC    Component Value Date/Time   WBC 5.9 07/20/2020 0350   RBC 4.78 07/20/2020 0350   HGB 11.5 (L) 07/20/2020 0350   HCT 38.3 07/20/2020 0350   PLT 334 07/20/2020 0350   MCV 80.1 07/20/2020 0350   MCH 24.1 (L) 07/20/2020 0350   MCHC 30.0 07/20/2020 0350   RDW 19.9 (H) 07/20/2020 0350   LYMPHSABS 3.8 07/16/2020 2204   MONOABS 0.7 07/16/2020 2204   EOSABS 0.4 07/16/2020 2204   BASOSABS 0.1 07/16/2020 2204   CMP    Component Value Date/Time   NA 141 07/20/2020 0350   K 3.4 (L) 07/20/2020 0350   CL 110 07/20/2020 0350   CO2 23 07/20/2020 0350   GLUCOSE 102 (H) 07/20/2020 0350   BUN 6 07/20/2020 0350   CREATININE 0.61 07/20/2020 0350   CALCIUM 8.9 07/20/2020 0350  PROT 7.7 07/16/2020 2204   ALBUMIN 3.7 07/16/2020 2204   AST 34 07/16/2020 2204   ALT 22 07/16/2020 2204   ALKPHOS 66 07/16/2020 2204   BILITOT 0.8 07/16/2020 2204   GFRNONAA >60 07/20/2020 0350   GFRAA >60 07/20/2020 0350   COAGS Lab Results  Component Value Date   INR 0.9 07/16/2020   Lipid Panel    Component Value Date/Time   CHOL 239 (H) 07/19/2020 0300   TRIG 330 (H) 07/20/2020 0350   HDL 43 07/19/2020 0300   CHOLHDL 5.6 07/19/2020 0300   VLDL 72 (H) 07/19/2020 0300   LDLCALC 124 (H) 07/19/2020 0300   HgbA1C  Lab Results  Component Value Date   HGBA1C 5.6 07/18/2020   Urinalysis    Component Value Date/Time   COLORURINE YELLOW 07/17/2020 0317   APPEARANCEUR HAZY (A) 07/17/2020 0317   LABSPEC 1.032 (H) 07/17/2020 0317   PHURINE 7.0 07/17/2020 0317   GLUCOSEU NEGATIVE 07/17/2020 0317    HGBUR MODERATE (A) 07/17/2020 0317   BILIRUBINUR NEGATIVE 07/17/2020 0317   KETONESUR NEGATIVE 07/17/2020 0317   PROTEINUR 30 (A) 07/17/2020 0317   NITRITE NEGATIVE 07/17/2020 0317   LEUKOCYTESUR MODERATE (A) 07/17/2020 0317    SIGNIFICANT DIAGNOSTIC STUDIES CT HEAD CODE STROKE WO CONTRAST 07/16/2020 1. Focal subarachnoid hemorrhage in the medial posterior left frontal lobe. Question venous hemorrhage. Vascular malformation considered less likely. Alternatively, could represent hemorrhagic conversion of an occult infarct.  2. Otherwise normal CT appearance of the brain.  3. ASPECTS is 10/10   CT Code Stroke CTA Head W/WO contrast CT Code Stroke CTA Neck W/WO contrast 07/16/2020 1. 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  2. Incomplete filling of posterior left frontal cortical vein could represent cortical vein thrombosis. Cerebral arteriogram would be useful for further evaluation of both possibilities.  3. Mild tortuosity of cervical vessels without significant cervical stenosis. This is most commonly seen in the setting of chronic hypertension.   Neuro Interventional Radiology - Cerebral Angiogram  07/17/2020 S/P 4 vessel cerebral arteriogram - RT CFA approach. 1. Smooth areas of narrowing of Lt ACA A2 and branches of A2 suspicious for dissection, less likely ICAD v vasospasm. No intraluminal filling defects noted . 2. Cortical veins and DVsinuses widely patent.  MR BRAIN WO CONTRAST MR ANGIO HEAD WO CONTRAST 07/17/2020 1. Moderate-sized acute left ACA territory infarct. 2. Small acute right cerebellar, left occipital, and right thalamic infarcts. 3. Motion degraded head MRA with apparent improved left A2 segment flow compared to yesterday's CTA though with at least 1 persistent severe stenosis.   DG Chest Port 1 View 07/17/2020 1. Endotracheal tube as above.  2. Low lung volumes with bibasilar atelectasis.  3. No pneumothorax or large  pleural effusion.   ECHOCARDIOGRAM COMPLETE 07/17/2020 1. Small septal based LV apical filling defect seen with contrast, suspect false tendon. Apical wall motion is hyperdynamic.Marland Kitchen Left ventricular ejection fraction, by estimation, is 70 to 75%. The left ventricle has hyperdynamic function. The left ventricle has no regional wall motion abnormalities. There is moderate left ventricular hypertrophy. Left ventricular diastolic parameters are consistent with Grade I diastolic dysfunction (impaired relaxation).  2. Right ventricular systolic function is normal. The right ventricular size is normal.  3. Left atrial size was mildly dilated.  4. The mitral valve is grossly normal. Trivial mitral valve regurgitation.  5. The aortic valve is tricuspid. Aortic valve regurgitation is not visualized. Mild aortic valve stenosis. Aortic valve area, by VTI measures 1.85  cm. Aortic valve mean gradient measures 11.0 mmHg. Aortic valve Vmax measures 2.15 m/s. Conclusion(s)/Recommendation(s): No intracardiac source of embolism detected on this transthoracic study. A transesophageal echocardiogram is recommended to exclude cardiac source of embolism if clinically indicated.   TRANSESOPHAGEAL ECHOCARDIOGRAM  07/19/2020 LVEF >55% Mild MR. Trivial TR and AR No LA/LAA thrombus or mass +PFO by saline microcavitation study. There was right to left flow at rest. No ASD.   BILATERAL LOWER EXTREMITY DOPPLER 07/18/2020 - No evidence of deep vein thrombosis seen in the lower extremities, bilaterally.  - No evidence of superficial venous thrombosis in the lower extremities, bilaterally.   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 (See cardiology reading for complete details)     HISTORY OF PRESENT ILLNESS Tanya Lopez is a 49 y.o. female with a history of hypertension who presents with weakness and aphasia that started abruptly at  21:07 on 07/16/2020. She was in the kitchen covering a dish, when she called to her daughter said that her right leg felt like it was going numb.  She subsequently developed trouble speaking.  A code stroke was activated en route and she was taken for an emergent head CT.  Unfortunately, she was not a TPA candidate due to a small cortical subarachnoid that appears acute.  A CTA was performed which demonstrates a left A2 occlusion. Given that there is no other treatment option, this case was discussed with Dr. Corliss Skains of neuro interventional radiology.  There is a possibility that this vessel is large enough to be amenable to some type of mechanical revascularization technique and therefore after discussion with the husband Pearson Grippe, local internist), the decision was made to proceed with attempting this. Her daughter denies any recent head trauma. Modified Rankin Scale: 0   HOSPITAL COURSE Tanya Lopez is a 49 y.o. female with history of hypertension who presents with right sided weakness and aphasia. She did not receive IV t-PA due to a small cortical subarachnoid that appeared acute. CTA-> left A2 occlusion. Cerebral angiogram -> narrowing of Lt ACA A2 and branches of A2 suspicious for dissection.  Stroke: left ACA infarcts and also right cerebellar and left temporoparietal small infarct with focal small SAH in the left frontal parafalcine with left A2 segment stenosis, etiology unclear, possible ACA dissection vs. cardioemoblic source  CT Head - Focal subarachnoid hemorrhage in the medial posterior left frontal lobe.  ASPECTS is 10/10   CTA H&N - Left A2 segment occlusion with poor reconstitution of distal branch vessels. Incomplete filling of posterior left frontal cortical vein could represent cortical vein thrombosis.  Cerebral Angiogram - Smooth areas of narrowing of Lt ACA A2 and branches of A2 suspicious for dissection, less likely ICAD v vasospasm. No intraluminal filling  defects noted. No cortical venous thrombosis  MRI head - moderate L ACA territory infarct. Small R cerebellar, L occipital, R thalamic infarcts.   MRA head - motion. Improved L A2 flow w/ at least 1 severe stenosis   2D Echo - EF 70-75%. No source of embolus. 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 Will start DAPT 7/29. Continue DAPT on d/c.   Therapy recommendations:  CIR  Disposition:  CIR  Follow-up Dr. Corliss Skainseveshwar. 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 Copper to consider PFO closure.   Hypertension  Home BP meds: norvasc 10, bysostolic 5  Treated w/  Cardene, now off  Stable on the high end  SBP goal < 180  Resumed norvasc 5->10  Resume bystolic 5  Long-term BP goal normotensive  Hyperlipidemia  Home Lipid lowering medication: none listed  TG 1600->1549-> off cleviprex and propofol ->492->362  TC 302->222->239  Direct LDL - 87.5, lipid panel LDL 124, goal < 70  Put on lipitor 80  Continue statin at discharge  Urinary retention  Has I&O 3 times  Bladder scan showed large volume urine  Foley catheter placed 7/26  Other Stroke Risk Factors  Obesity, Body mass index is 38.32 kg/m., recommend weight loss, diet and exercise as  appropriate   Other Active Problems  Mildly elevated lactic acid - Unclear etiology - monitoring  Hypokalemia 3.2 -> 3.4 - supplement    DISCHARGE EXAM Blood pressure (!) 149/87, pulse 72, temperature 98.9 F (37.2 C), temperature source Oral, resp. rate 20, height 5\' 3"  (1.6 m), weight (!) 98.1 kg, SpO2 100 %. General - Well nourished, well developed, not in acute distress  Ophthalmologic - fundi not visualized due to noncooperation.  Cardiovascular - Regular rate and rhythm.  Neuro - awake alert, eyes open, following 3-step commands. Able to have spontaneous speech with simple sentences, still has difficulty forming sentences with significant hesitation. However, able to name 3/4 and repeat well. Eyes in mid position, no gaze palsy, visual field full, able to track bilaterally, PERRL. Mild right facial asymmetry, tongue midline.  Left UE and LLE 5/5, however, no movement of right UE and LE. DTR 1+ and no babinski. Sensation symmetrical subjectively, coordination intact LUE and gait not tested.  Discharge Diet   Heart healthy thin liquids  DISCHARGE PLAN  Disposition:  CIR for ongoing therapy  aspirin 325 mg daily for secondary stroke prevention. Add plavix 7/29 and continue DAPT. Length of treatment per Dr. Excell Seltzerooper  Ongoing stroke risk factor control by Primary Care Physician at time of discharge  Follow-up PCP in 2 weeks following rehab stay  Follow-up in [facility] in 4 weeks following rehab stay, office to schedule an appointment.   Follow-up Dr. Corliss Skainseveshwar. Repeat CTA head and neck in 1 month as an OP  plan 30 day monitoring following rehab  OP referral to Dr. Copper to consider PFO closure.   35 minutes were spent preparing discharge.  Marvel PlanJindong Odelle Kosier, MD PhD Stroke Neurology 07/20/2020 3:07 PM

## 2020-07-20 NOTE — Progress Notes (Signed)
Horton Chin, MD  Physician  Physical Medicine and Rehabilitation  Consult Note      Signed  Date of Service:  07/19/2020  5:39 AM      Related encounter: ED to Hosp-Admission (Current) from 07/16/2020 in [place] [facility]      Signed      Expand All Collapse All  Show:Clear all Manual[x] Template[] Copied  Added by: Angiulli, Mcarthur Rossetti, PA-C[x] Raulkar, Drema Pry, MD  Hover for details          Physical Medicine and Rehabilitation Consult Reason for Consult: Right side weakness and inability to speak Referring Physician: Dr.Xu     HPI: Tanya Lopez is a 49 y.o. right-handed female with history of hypertension.  Per chart review patient lives with spouse and children.  1 level home 3 steps to entry.  She has 1 child in college 1 in high school.  Reportedly independent prior to admission.  Presented 07/16/2020 with acute onset of left-sided weakness and aphasia.  Cranial CT scan showed focal subarachnoid hemorrhage in the medial posterior left frontal lobe.  CT angiogram of head and neck showed left A2 segment occlusion with poor reconstitution of distal branch vessels.  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  Patient did not receive TPA.  Admission chemistries unremarkable, urinalysis negative nitrite, lactic acid 2.5.  Cerebral angiogram completed showing smooth areas of narrowing of left ACA A2 and branches of A2 suspicious for possible dissection but no cortical thrombosis.  Plan currently is to follow-up with interventional radiology 1 month for repeat CTA of head and neck.  Patient did initially require ventilatory support and extubated 07/17/2020.  Lower extremity Dopplers negative for DVT.  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  Small acute right cerebellar left occipital and right thalamic infarct.  Motion degraded head MRA with apparent improved left  A2 segment flow compared to prior CTA.  Maintained on Cardene for blood pressure control.  Echocardiogram with ejection fraction of 75% no wall motion abnormalities.  Awaiting TEE.  Patient currently maintained on aspirin 325 mg daily for CVA prophylaxis.  Subcutaneous heparin for DVT prophylaxis.  Therapy evaluations completed with recommendations of physical medicine rehab consult.     Review of Systems  Constitutional: Negative for chills and fever.  HENT: Negative for hearing loss.   Eyes: Negative for blurred vision and double vision.  Respiratory: Negative for cough and shortness of breath.   Cardiovascular: Negative for chest pain, palpitations and leg swelling.  Gastrointestinal: Positive for constipation. Negative for heartburn, nausea and vomiting.  Genitourinary: Negative for dysuria, flank pain and hematuria.  Musculoskeletal: Positive for myalgias.  Skin: Negative for rash.  Neurological: Positive for sensory change, speech change and focal weakness.  All other systems reviewed and are negative.   PMH/PSH: HTN   FH: Unable to obtain du to expressive aphasia   Social History:  has no history on file for tobacco use, alcohol use, and drug use.    Allergies: No Known Allergies       Medications Prior to Admission  Medication Sig Dispense Refill  . amLODipine (NORVASC) 10 MG tablet Take 10 mg by mouth daily.      . nebivolol (BYSTOLIC) 5 MG tablet Take 5 mg by mouth daily.          Home: Home Living Family/patient expects to be discharged to:: Private residence Living Arrangements: Spouse/significant other Available Help at Discharge: Family, Available 24 hours/day Type  of Home: House Home Access: Stairs to enter [employer] of Steps: 3 Entrance Stairs-Rails: None Home Layout: One level Bathroom Shower/Tub: Psychologist, counselling, Sport and exercise psychologist: Standard Home Equipment: Information systems manager - built in  Lives With: Spouse, Family  Functional History: Prior  Function Level of Independence: Independent Functional Status:  Mobility: Bed Mobility Overal bed mobility: Needs Assistance Bed Mobility: Rolling, Sidelying to Sit Rolling: Min assist Sidelying to sit: Max assist, +2 for safety/equipment General bed mobility comments: rolled to R and assist for legs off bed and trunk upright Transfers Overall transfer level: Needs assistance Equipment used: 2 person hand held assist Transfers: Sit to/from Stand, [employer] Transfers Sit to Stand: Mod assist Stand pivot transfers: Mod assist, +2 physical assistance General transfer comment: A to block right knee for sit<>stand and stand pivot with A to move RLE as well for stand pivot bed to [facility], then transitioned to recliner via sit to stand with +1 A and another to move [facility] and place recliner Ambulation/Gait General Gait Details: NT   ADL: ADL Overall ADL's : Needs assistance/impaired Eating/Feeding: Maximal assistance Eating/Feeding Details (indicate cue type and reason): supported sitting Grooming: Maximal assistance Grooming Details (indicate cue type and reason): supported sitting Upper Body Bathing: Maximal assistance Upper Body Bathing Details (indicate cue type and reason): supported sitting Lower Body Bathing: Maximal assistance Lower Body Bathing Details (indicate cue type and reason): Mod A sit<>stand and maintain standing with right knee blocked Upper Body Dressing : Maximal assistance Upper Body Dressing Details (indicate cue type and reason): supported sitting Lower Body Dressing: Total assistance Lower Body Dressing Details (indicate cue type and reason): Mod A sit<>stand and maintain standing with right knee blocked Toilet Transfer: Moderate assistance, +2 for physical assistance, Stand-pivot, BSC Toilet Transfer Details (indicate cue type and reason): right knee blocked and A to move RLE Toileting- Clothing Manipulation and Hygiene: Total assistance Toileting - Clothing  Manipulation Details (indicate cue type and reason): Mod A sit<>stand and maintain standing with right knee blocked   Cognition: Cognition Overall Cognitive Status: Impaired/Different from baseline Arousal/Alertness: Awake/alert Orientation Level: Oriented to person Attention: Sustained Sustained Attention: Impaired Sustained Attention Impairment: Verbal basic, Functional basic Memory:  (DTA) Awareness: Impaired Safety/Judgment: Other (comment) (DTA) Comments:  (R inattention) Cognition Arousal/Alertness: Awake/alert Behavior During Therapy: Flat affect Overall Cognitive Status: Impaired/Different from baseline General Comments: Did follow all one step commands Difficult to assess due to: Impaired communication (aphasia)   Blood pressure (!) 167/110, pulse 96, temperature 99.2 F (37.3 C), temperature source Oral, resp. rate 21, height 5\' 3"  (1.6 m), weight (!) 98.1 kg, SpO2 96 %. Physical Exam General:  No apparent distress HEENT: Head is normocephalic, atraumatic, PERRLA, EOMI, sclera anicteric, oral mucosa pink and moist, dentition intact, ext ear canals clear,  Neck: Supple without JVD or lymphadenopathy Heart: Reg rate and rhythm. No murmurs rubs or gallops Chest: CTA bilaterally without wheezes, rales, or rhonchi; no distress Abdomen: Soft, non-tender, non-distended, bowel sounds positive. Extremities: No clubbing, cyanosis, or edema. Pulses are 2+ Skin: Clean and intact without signs of breakdown Neuro: Patient is alert.  Expressive aphasia and largely nonverbal.  Follows simple demonstrated commands. Dense right sided hemiplegia. LUE 4/5 and LLE 4-/5 Psych: Pt's affect is appropriate. Pt is cooperative   Lab Results Last 24 Hours  Results for orders placed or performed during the hospital encounter of 07/16/20 (from the past 24 hour(s))  CBC     Status: Abnormal    Collection Time: 07/19/20  3:00 AM  Result Value Ref Range    WBC 6.7 4.0 - 10.5 K/uL    RBC 4.56 3.87 -  5.11 MIL/uL    Hemoglobin 11.1 (L) 12.0 - 15.0 g/dL    HCT 78.2 36 - 46 %    MCV 80.7 80.0 - 100.0 fL    MCH 24.3 (L) 26.0 - 34.0 pg    MCHC 30.2 30.0 - 36.0 g/dL    RDW 95.6 (H) 21.3 - 15.5 %    Platelets 363 150 - 400 K/uL    nRBC 0.0 0.0 - 0.2 %  Basic metabolic panel     Status: Abnormal    Collection Time: 07/19/20  3:00 AM  Result Value Ref Range    Sodium 141 135 - 145 mmol/L    Potassium 3.2 (L) 3.5 - 5.1 mmol/L    Chloride 108 98 - 111 mmol/L    CO2 23 22 - 32 mmol/L    Glucose, Bld 103 (H) 70 - 99 mg/dL    BUN 5 (L) 6 - 20 mg/dL    Creatinine, Ser 0.86 0.44 - 1.00 mg/dL    Calcium 8.6 (L) 8.9 - 10.3 mg/dL    GFR calc non Af Amer >60 >60 mL/min    GFR calc Af Amer >60 >60 mL/min    Anion gap 10 5 - 15  Triglycerides     Status: Abnormal    Collection Time: 07/19/20  3:00 AM  Result Value Ref Range    Triglycerides 361 (H) <150 mg/dL       Imaging Results (Last 48 hours)  MR ANGIO HEAD WO CONTRAST   Result Date: 07/17/2020 CLINICAL DATA:  Stroke follow-up. Acute right-sided weakness and aphasia. Focal medial posterior left frontal subarachnoid hemorrhage on CT with left A2 occlusion on CTA and possible dissection on catheter angiography. EXAM: MRI HEAD WITHOUT CONTRAST MRA HEAD WITHOUT CONTRAST TECHNIQUE: Multiplanar, multiecho pulse sequences of the brain and surrounding structures were obtained without intravenous contrast. Angiographic images of the head were obtained using MRA technique without contrast. COMPARISON:  Head CT and CTA 07/16/2020 and subsequent catheter angiography FINDINGS: MRI HEAD FINDINGS Brain: There is a moderate-sized acute parasagittal left frontal lobe infarct in the ACA territory with minimal petechial hemorrhage. There is a 7 mm acute right cerebellar infarct, and there are punctate acute infarcts in the left occipital lobe and right thalamus. No significant chronic white matter disease is evident. The ventricles are normal in size. No mass,  midline shift, or extra-axial fluid collection is evident. Vascular: Major intracranial vascular flow voids are preserved. Skull and upper cervical spine: Unremarkable bone marrow signal. Sinuses/Orbits: Unremarkable orbits. Small left maxillary sinus mucous retention cyst. Clear mastoid air cells. Other: None. MRA HEAD FINDINGS The study is mildly motion degraded. The visualized distal vertebral arteries are patent to the basilar with the left being mildly dominant. Patent left PICA, left AICA, and bilateral SCA origins are present. A right PICA and right AICA are not clearly identified. The basilar artery is widely patent. There are patent moderate-sized posterior communicating arteries bilaterally. Both PCAs are patent without evidence of a significant proximal stenosis. The internal carotid arteries are widely patent from skull base to carotid termini. ACAs and MCAs are patent proximally without evidence of a significant A1 or M1 stenosis. Branch vessel evaluation is limited by motion artifact including through the A2 segments, however left A2 flow appears improved from yesterday's CTA although there is still evidence of at least 1 severe focal stenosis. No aneurysm  is identified. IMPRESSION: 1. Moderate-sized acute left ACA territory infarct. 2. Small acute right cerebellar, left occipital, and right thalamic infarcts. 3. Motion degraded head MRA with apparent improved left A2 segment flow compared to yesterday's CTA though with at least 1 persistent severe stenosis. Electronically Signed   By: Sebastian AcheAllen  Grady M.D.   On: 07/17/2020 12:27    MR BRAIN WO CONTRAST   Result Date: 07/17/2020 CLINICAL DATA:  Stroke follow-up. Acute right-sided weakness and aphasia. Focal medial posterior left frontal subarachnoid hemorrhage on CT with left A2 occlusion on CTA and possible dissection on catheter angiography. EXAM: MRI HEAD WITHOUT CONTRAST MRA HEAD WITHOUT CONTRAST TECHNIQUE: Multiplanar, multiecho pulse sequences of  the brain and surrounding structures were obtained without intravenous contrast. Angiographic images of the head were obtained using MRA technique without contrast. COMPARISON:  Head CT and CTA 07/16/2020 and subsequent catheter angiography FINDINGS: MRI HEAD FINDINGS Brain: There is a moderate-sized acute parasagittal left frontal lobe infarct in the ACA territory with minimal petechial hemorrhage. There is a 7 mm acute right cerebellar infarct, and there are punctate acute infarcts in the left occipital lobe and right thalamus. No significant chronic white matter disease is evident. The ventricles are normal in size. No mass, midline shift, or extra-axial fluid collection is evident. Vascular: Major intracranial vascular flow voids are preserved. Skull and upper cervical spine: Unremarkable bone marrow signal. Sinuses/Orbits: Unremarkable orbits. Small left maxillary sinus mucous retention cyst. Clear mastoid air cells. Other: None. MRA HEAD FINDINGS The study is mildly motion degraded. The visualized distal vertebral arteries are patent to the basilar with the left being mildly dominant. Patent left PICA, left AICA, and bilateral SCA origins are present. A right PICA and right AICA are not clearly identified. The basilar artery is widely patent. There are patent moderate-sized posterior communicating arteries bilaterally. Both PCAs are patent without evidence of a significant proximal stenosis. The internal carotid arteries are widely patent from skull base to carotid termini. ACAs and MCAs are patent proximally without evidence of a significant A1 or M1 stenosis. Branch vessel evaluation is limited by motion artifact including through the A2 segments, however left A2 flow appears improved from yesterday's CTA although there is still evidence of at least 1 severe focal stenosis. No aneurysm is identified. IMPRESSION: 1. Moderate-sized acute left ACA territory infarct. 2. Small acute right cerebellar, left  occipital, and right thalamic infarcts. 3. Motion degraded head MRA with apparent improved left A2 segment flow compared to yesterday's CTA though with at least 1 persistent severe stenosis. Electronically Signed   By: Sebastian AcheAllen  Grady M.D.   On: 07/17/2020 12:27    ECHOCARDIOGRAM COMPLETE   Result Date: 07/17/2020    ECHOCARDIOGRAM REPORT   Patient Name:   Tanya Lopez Date of Exam: 07/17/2020 Medical Rec #:  [id number]                    Height:       63.0 in Accession #:    [id number]                   Weight:       210.1 lb Date of Birth:  02/02/1971                     BSA:          1.975 m Patient Age:    [age] years  BP:           134/68 mmHg Patient Gender: F                            HR:           95 bpm. Exam Location:  Inpatient Procedure: 2D Echo, Cardiac Doppler, Color Doppler and Intracardiac            Opacification Agent Indications:    Stroke  History:        Patient has no prior history of Echocardiogram examinations.                 Risk Factors:Hypertension.  Sonographer:    Ross Ludwig RDCS (AE) Referring Phys: [phone] MCNEILL P KIRKPATRICK IMPRESSIONS  1. Small septal based LV apical filling defect seen with contrast, suspect false tendon. Apical wall motion is hyperdynamic.Marland Kitchen Left ventricular ejection fraction, by estimation, is 70 to 75%. The left ventricle has hyperdynamic function. The left ventricle has no regional wall motion abnormalities. There is moderate left ventricular hypertrophy. Left ventricular diastolic parameters are consistent with Grade I diastolic dysfunction (impaired relaxation).  2. Right ventricular systolic function is normal. The right ventricular size is normal.  3. Left atrial size was mildly dilated.  4. The mitral valve is grossly normal. Trivial mitral valve regurgitation.  5. The aortic valve is tricuspid. Aortic valve regurgitation is not visualized. Mild aortic valve stenosis. Aortic valve area, by VTI measures 1.85 cm. Aortic valve  mean gradient measures 11.0 mmHg. Aortic valve Vmax measures 2.15 m/s. Conclusion(s)/Recommendation(s): No intracardiac source of embolism detected on this transthoracic study. A transesophageal echocardiogram is recommended to exclude cardiac source of embolism if clinically indicated. FINDINGS  Left Ventricle: Small septal based LV apical filling defect seen with contrast, suspect false tendon. Apical wall motion is hyperdynamic. Left ventricular ejection fraction, by estimation, is 70 to 75%. The left ventricle has hyperdynamic function. The left ventricle has no regional wall motion abnormalities. Definity contrast agent was given IV to delineate the left ventricular endocardial borders. The left ventricular internal cavity size was normal in size. There is moderate left ventricular hypertrophy. Left ventricular diastolic parameters are consistent with Grade I diastolic dysfunction (impaired relaxation). Indeterminate filling pressures. Right Ventricle: The right ventricular size is normal. No increase in right ventricular wall thickness. Right ventricular systolic function is normal. Left Atrium: Left atrial size was mildly dilated. Right Atrium: Right atrial size was normal in size. Pericardium: There is no evidence of pericardial effusion. Mitral Valve: The mitral valve is grossly normal. Trivial mitral valve regurgitation. MV peak gradient, 5.9 mmHg. The mean mitral valve gradient is 3.0 mmHg. Tricuspid Valve: The tricuspid valve is grossly normal. Tricuspid valve regurgitation is trivial. Aortic Valve: The aortic valve is tricuspid. Aortic valve regurgitation is not visualized. Mild aortic stenosis is present. Aortic valve mean gradient measures 11.0 mmHg. Aortic valve peak gradient measures 18.5 mmHg. Aortic valve area, by VTI measures 1.85 cm. Pulmonic Valve: The pulmonic valve was grossly normal. Pulmonic valve regurgitation is not visualized. Aorta: The aortic root and ascending aorta are structurally  normal, with no evidence of dilitation. Venous: IVC assessment for right atrial pressure unable to be performed due to mechanical ventilation. IAS/Shunts: No atrial level shunt detected by color flow Doppler.  LEFT VENTRICLE PLAX 2D LVIDd:         3.60 cm  Diastology LVIDs:         2.10 cm  LV e' lateral:   8.38 cm/s LV PW:         1.30 cm  LV E/e' lateral: 11.4 LV IVS:        1.30 cm  LV e' medial:    6.31 cm/s LVOT diam:     1.70 cm  LV E/e' medial:  15.1 LV SV:         70 LV SV Index:   35 LVOT Area:     2.27 cm  RIGHT VENTRICLE             IVC RV Basal diam:  2.50 cm     IVC diam: 2.10 cm RV S prime:     18.10 cm/s TAPSE (M-mode): 3.0 cm LEFT ATRIUM             Index       RIGHT ATRIUM           Index LA diam:        3.40 cm 1.72 cm/m  RA Area:     12.30 cm LA Vol (A2C):   56.7 ml 28.72 ml/m RA Volume:   26.90 ml  13.62 ml/m LA Vol (A4C):   74.4 ml 37.68 ml/m LA Biplane Vol: 66.9 ml 33.88 ml/m  AORTIC VALVE AV Area (Vmax):    1.96 cm AV Area (Vmean):   2.01 cm AV Area (VTI):     1.85 cm AV Vmax:           215.00 cm/s AV Vmean:          157.000 cm/s AV VTI:            0.377 m AV Peak Grad:      18.5 mmHg AV Mean Grad:      11.0 mmHg LVOT Vmax:         186.00 cm/s LVOT Vmean:        139.000 cm/s LVOT VTI:          0.307 m LVOT/AV VTI ratio: 0.81  AORTA Ao Root diam: 2.90 cm Ao Asc diam:  3.10 cm MITRAL VALVE MV Area (PHT): 2.99 cm     SHUNTS MV Peak grad:  5.9 mmHg     Systemic VTI:  0.31 m MV Mean grad:  3.0 mmHg     Systemic Diam: 1.70 cm MV Vmax:       1.21 m/s MV Vmean:      81.9 cm/s MV Decel Time: 254 msec MV E velocity: 95.50 cm/s MV A velocity: 128.00 cm/s MV E/A ratio:  0.75 Zoila Shutter MD Electronically signed by Zoila Shutter MD Signature Date/Time: 07/17/2020/3:39:13 PM    Final     VAS [place] LOWER EXTREMITY VENOUS (DVT)   Result Date: 07/18/2020  Lower Venous DVTStudy Indications: Stroke.  Comparison Study: no prior Performing Technologist: Blanch Media RVS  Examination Guidelines: A  complete evaluation includes B-mode imaging, spectral Doppler, color Doppler, and power Doppler as needed of all accessible portions of each vessel. Bilateral testing is considered an integral part of a complete examination. Limited examinations for reoccurring indications may be performed as noted. The reflux portion of the exam is performed with the patient in reverse Trendelenburg.  +---------+---------------+---------+-----------+----------+--------------+ RIGHT    CompressibilityPhasicitySpontaneityPropertiesThrombus Aging +---------+---------------+---------+-----------+----------+--------------+ CFV      Full           Yes      Yes                                 +---------+---------------+---------+-----------+----------+--------------+  SFJ      Full                                                        +---------+---------------+---------+-----------+----------+--------------+ FV Prox  Full                                                        +---------+---------------+---------+-----------+----------+--------------+ FV Mid   Full                                                        +---------+---------------+---------+-----------+----------+--------------+ FV DistalFull                                                        +---------+---------------+---------+-----------+----------+--------------+ PFV      Full                                                        +---------+---------------+---------+-----------+----------+--------------+ POP      Full           Yes      Yes                                 +---------+---------------+---------+-----------+----------+--------------+ PTV      Full                                                        +---------+---------------+---------+-----------+----------+--------------+ PERO     Full                                                         +---------+---------------+---------+-----------+----------+--------------+   +---------+---------------+---------+-----------+----------+--------------+ LEFT     CompressibilityPhasicitySpontaneityPropertiesThrombus Aging +---------+---------------+---------+-----------+----------+--------------+ CFV      Full           Yes      Yes                                 +---------+---------------+---------+-----------+----------+--------------+ SFJ      Full                                                        +---------+---------------+---------+-----------+----------+--------------+  FV Prox  Full                                                        +---------+---------------+---------+-----------+----------+--------------+ FV Mid   Full                                                        +---------+---------------+---------+-----------+----------+--------------+ FV DistalFull                                                        +---------+---------------+---------+-----------+----------+--------------+ PFV      Full                                                        +---------+---------------+---------+-----------+----------+--------------+ POP      Full           Yes      Yes                                 +---------+---------------+---------+-----------+----------+--------------+ PTV      Full                                                        +---------+---------------+---------+-----------+----------+--------------+ PERO     Full                                                        +---------+---------------+---------+-----------+----------+--------------+     Summary: BILATERAL: - No evidence of deep vein thrombosis seen in the lower extremities, bilaterally. - No evidence of superficial venous thrombosis in the lower extremities, bilaterally. -   *See table(s) above for measurements and observations. Electronically signed by  Fabienne Bruns MD on 07/18/2020 at 7:33:36 PM.    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 Does the need for close, 24 hr/day medical supervision in concert with the patient's rehab needs make it unreasonable for this patient to be served in a less intensive setting? Yes 2. Co-Morbidities requiring supervision/potential complications: mild mitral regurgitation, patent foramen ovale 3. Due to bladder management, bowel management, safety, skin/wound care, disease management, medication administration, pain management and patient education, does the patient require 24 hr/day rehab nursing? Yes 4. Does the patient require coordinated care of a physician, rehab nurse, therapy disciplines of PT, OT, SLP to address physical and functional deficits in the context of the above medical diagnosis(es)? Yes Addressing  deficits in the following areas: balance, endurance, locomotion, strength, transferring, bowel/bladder control, bathing, dressing, feeding, grooming, toileting, cognition, speech, language, swallowing and psychosocial support 5. Can the patient actively participate in an intensive therapy program of at least 3 hrs of therapy per day at least 5 days per week? Yes 6. The potential for patient to make measurable gains while on inpatient rehab is excellent 7. Anticipated functional outcomes upon discharge from inpatient rehab are min assist  with PT, min assist with OT, min assist with SLP. 8. Estimated rehab length of stay to reach the above functional goals is: 20-22 days 9. Anticipated discharge destination: Home 10. Overall Rehab/Functional Prognosis: excellent   RECOMMENDATIONS: This patient's condition is appropriate for continued rehabilitative care in the following setting: CIR Patient has agreed to participate in recommended program. Yes Note that insurance prior authorization may be required for reimbursement for recommended care.     Comment: Mr. Siri Cole would be an excellent CIR candidate. He has severe deficits in mobility and aphasia and would benefit from intensive PT, OT, and SLP   Charlton Amor, PA-C 07/19/2020    I have personally performed a face to face diagnostic evaluation, including, but not limited to relevant history and physical exam findings, of this patient and developed relevant assessment and plan.  Additionally, I have reviewed and concur with the physician assistant's documentation above.   Sula Soda, MD        Revision History                     Routing History                Note Details  Author Carlis Abbott, Drema Pry, MD File Time 07/19/2020  1:03 PM  Author Type Physician Status Signed  Last Editor Horton Chin, MD Service Physical Medicine and Rehabilitation

## 2020-07-20 NOTE — Progress Notes (Signed)
Patient arrived to unit a/o x4 via bed. Accompanied by husband. Patient was oriented to unit and room. Patient safety prevention plan, visitors policy, and use of the call bell system has been reviewed with patient. Denies pain currently. Leane Para, LPN

## 2020-07-21 ENCOUNTER — Inpatient Hospital Stay (HOSPITAL_COMMUNITY): Payer: BC Managed Care – PPO | Admitting: Occupational Therapy

## 2020-07-21 ENCOUNTER — Inpatient Hospital Stay (HOSPITAL_COMMUNITY): Payer: BC Managed Care – PPO

## 2020-07-21 ENCOUNTER — Other Ambulatory Visit: Payer: Self-pay

## 2020-07-21 DIAGNOSIS — I63522 Cerebral infarction due to unspecified occlusion or stenosis of left anterior cerebral artery: Secondary | ICD-10-CM | POA: Diagnosis not present

## 2020-07-21 LAB — COMPREHENSIVE METABOLIC PANEL
ALT: 22 U/L (ref 0–44)
AST: 25 U/L (ref 15–41)
Albumin: 3.4 g/dL — ABNORMAL LOW (ref 3.5–5.0)
Alkaline Phosphatase: 54 U/L (ref 38–126)
Anion gap: 10 (ref 5–15)
BUN: 8 mg/dL (ref 6–20)
CO2: 25 mmol/L (ref 22–32)
Calcium: 9 mg/dL (ref 8.9–10.3)
Chloride: 104 mmol/L (ref 98–111)
Creatinine, Ser: 0.72 mg/dL (ref 0.44–1.00)
GFR calc Af Amer: >60 mL/min (ref >60)
GFR calc non Af Amer: >60 mL/min (ref >60)
Glucose, Bld: 107 mg/dL — ABNORMAL HIGH (ref 70–99)
Potassium: 3.5 mmol/L (ref 3.5–5.1)
Sodium: 139 mmol/L (ref 135–145)
Total Bilirubin: 0.6 mg/dL (ref 0.3–1.2)
Total Protein: 7.2 g/dL (ref 6.5–8.1)

## 2020-07-21 LAB — CBC WITH DIFFERENTIAL/PLATELET
Abs Immature Granulocytes: 0.03 10*3/uL (ref 0.00–0.07)
Basophils Absolute: 0 10*3/uL (ref 0.0–0.1)
Basophils Relative: 1 %
Eosinophils Absolute: 0.5 10*3/uL (ref 0.0–0.5)
Eosinophils Relative: 9 %
HCT: 38.9 % (ref 36.0–46.0)
Hemoglobin: 11.8 g/dL — ABNORMAL LOW (ref 12.0–15.0)
Immature Granulocytes: 1 %
Lymphocytes Relative: 35 %
Lymphs Abs: 2.2 10*3/uL (ref 0.7–4.0)
MCH: 24.2 pg — ABNORMAL LOW (ref 26.0–34.0)
MCHC: 30.3 g/dL (ref 30.0–36.0)
MCV: 79.9 fL — ABNORMAL LOW (ref 80.0–100.0)
Monocytes Absolute: 0.4 10*3/uL (ref 0.1–1.0)
Monocytes Relative: 7 %
Neutro Abs: 3 10*3/uL (ref 1.7–7.7)
Neutrophils Relative %: 47 %
Platelets: 363 10*3/uL (ref 150–400)
RBC: 4.87 MIL/uL (ref 3.87–5.11)
RDW: 19.9 % — ABNORMAL HIGH (ref 11.5–15.5)
WBC: 6.3 10*3/uL (ref 4.0–10.5)
nRBC: 0 % (ref 0.0–0.2)

## 2020-07-21 LAB — CARDIOLIPIN ANTIBODIES, IGG, IGM, IGA
Anticardiolipin IgA: <9 APL U/mL (ref 0–11)
Anticardiolipin IgG: <9 GPL U/mL (ref 0–14)
Anticardiolipin IgM: <9 MPL U/mL (ref 0–12)

## 2020-07-21 MED ORDER — CLOPIDOGREL BISULFATE 75 MG PO TABS: 75.0000 mg | ORAL_TABLET | Freq: Every day | ORAL | Status: DC

## 2020-07-21 MED ORDER — CLOPIDOGREL BISULFATE 75 MG PO TABS
75.0000 mg | ORAL_TABLET | Freq: Every day | ORAL | Status: DC
  Administered 2020-07-21 – 2020-08-11 (×22): 75 mg via ORAL
  Filled 2020-07-21 (×22): qty 1

## 2020-07-21 MED ORDER — LIDOCAINE HCL URETHRAL/MUCOSAL 2 % EX GEL: CUTANEOUS | Status: DC | PRN

## 2020-07-21 NOTE — Evaluation (Signed)
Physical Therapy Assessment and Plan  Patient Details  Name: Tanya Lopez MRN: [id number] Date of Birth: [birth year]  PT Diagnosis: Hemiparesis dominant and Muscle weakness Rehab Potential: Good ELOS: 21-24 days   Today's Date: 07/21/2020 PT Individual Time: 0802-0859 PT Individual Time Calculation (min): 57 min    Hospital Problem: Principal Problem:   Acute L ACA, R cerebellar, L temporoparietal infarcts ([place])  Active Problems:   Acute ischemic left MCA stroke [facility])   Past Medical History:  Past Medical History:  Diagnosis Date  . Hypertension   . PFO (patent foramen ovale) 07/19/2020   Noted on TEE 07/19/2020   Past Surgical History:  Past Surgical History:  Procedure Laterality Date  . IR ANGIO INTRA EXTRACRAN SEL COM CAROTID INNOMINATE BILAT MOD SED  07/17/2020  . IR ANGIO VERTEBRAL SEL VERTEBRAL BILAT MOD SED  07/17/2020  . IR CT HEAD LTD  07/17/2020  . RADIOLOGY WITH ANESTHESIA N/A 07/16/2020   Procedure: IR WITH ANESTHESIA;  Surgeon: Luanne Bras, MD;  Location: [place];  Service: Radiology;  Laterality: N/A;  . TEE WITHOUT CARDIOVERSION N/A 07/19/2020   Procedure: TRANSESOPHAGEAL ECHOCARDIOGRAM (TEE);  Surgeon: Skeet Latch, MD;  Location: [place];  Service: Cardiovascular;  Laterality: N/A;    Assessment & Plan Clinical Impression: Patient is a Tanya Furlong. Lopez is a 49 year old female with history of HTN who was admitted on 07/16/2020 with right hemiparesis, numbness, dysarthria.  History taken from chart review and husband due to aphasia.  CT head personally reviewed, showing left frontal SAH.  Per report, focal SAH in medial posterior left frontal lobe question venous hemorrhage therefore not tPA candidate. CTA hed showed left A2 segment occlusion with incomplete filling of left frontal cortical vein question cortical vein thrombosis--angio recommended. She underwent cerebral angio by Dr.Deveshwar revealing smooth narrowing of L-ACA A2 and branches  of A2 suspicious for dissection and no intraluminal filling defects noted.  MRI/MRA brain done revealing moderate L-ACA infarct, small acute right cerebellar, left occipital and right thalamic infarcts--no signifcant stenosis and improvement in L-A2 flow. Elevated BP treated with Cardene drip and she was placed on ASA daily with recommendations to repeat CTA head in one month and start DAPT in 3 days?  Lactic acidosis at admission improved with IVF. Dr. Erlinda Hong felt that stroke was of unknown etiology due to dissection v/s cardiembolic source.  TEE showed it ejection fraction of 65 to 70% without any wall motion abnormalities, however positive for PFO with right to left shunting. TCD bubble study ordered to decide on loop v/s 30 day event monitor.  BLE dopplers ordered and were negative for DVT. On regular diet with thin liquids and aspiration precautions. Hospital course further complicated by hypokalemia. Therapy evaluations done revealing expressive aphasia with minimal verbal output and right flaccid hemiparesis affecting mobility and ADLs.  Patient transferred to CIR on 07/20/2020 .   Patient currently requires max with mobility secondary to muscle weakness and muscle paralysis, decreased cardiorespiratoy endurance, abnormal tone,  , delayed processing and decreased sitting balance, decreased standing balance, decreased postural control, hemiplegia and decreased balance strategies.  Prior to hospitalization, patient was independent  with mobility and lived with Spouse, Family in a House home.  Home access is 3Stairs to enter.  Patient will benefit from skilled PT intervention to maximize safe functional mobility, minimize fall risk and decrease caregiver burden for planned discharge home with 24 hour assist.  Anticipate patient will benefit from follow up [facility] at discharge.  PT - End  of Session Endurance Deficit: Yes Endurance Deficit Description: Rest breaks within BADL tasks   PT  Evaluation Precautions/Restrictions Precautions Precautions: Fall Precaution Comments: R hemiplegia Restrictions Weight Bearing Restrictions: No General  Home Living/Prior Functioning Home Living Available Help at Discharge: Family;Available 24 hours/day Type of Home: House Home Access: Stairs to enter [employer] of Steps: 3 Entrance Stairs-Rails: None Home Layout: One level Bathroom Shower/Tub: Walk-in shower;Door [employer] Toilet: Standard Bathroom Accessibility: Yes  Lives With: Spouse;Family Prior Function Level of Independence: Independent with homemaking with ambulation;Independent with basic ADLs Driving: Yes Vision/Perception  Perception Perception: Within Functional Limits Praxis Praxis: Intact  Cognition Overall Cognitive Status: Impaired/Different from baseline Arousal/Alertness: Awake/alert Memory: Appears intact Immediate Memory Recall: Sock;Blue;Bed Memory Recall Sock: Without Cue Memory Recall Blue: Without Cue Memory Recall Bed: Without Cue Awareness: Impaired Problem Solving: Impaired Safety/Judgment: Appears intact Sensation Sensation Light Touch: Appears Intact Coordination Gross Motor Movements are Fluid and Coordinated: No Fine Motor Movements are Fluid and Coordinated: No Coordination and Movement Description: decreased smoothness and accuracy due to weakness Motor  Motor Motor: Hemiplegia Motor - Skilled Clinical Observations: R Hemiparesis   Trunk/Postural Assessment  Cervical Assessment Cervical Assessment: Exceptions to [facility] (forward head) Thoracic Assessment Thoracic Assessment: Exceptions to [facility] (rounded shoulders) Lumbar Assessment Lumbar Assessment: Exceptions to [facility] (posterior pelvic tilt) Postural Control Postural Control: Deficits on evaluation Righting Reactions: delayed  Balance Balance Balance Assessed: Yes Static Sitting Balance Static Sitting - Balance Support: Feet supported Static Sitting - Level of  Assistance: 4: Min assist Dynamic Sitting Balance Dynamic Sitting - Balance Support: During functional activity;Feet supported Dynamic Sitting - Level of Assistance: 4: Min assist;3: Mod assist Static Standing Balance Static Standing - Balance Support: During functional activity;Bilateral upper extremity supported Static Standing - Level of Assistance: 3: Mod assist;4: Min assist Dynamic Standing Balance Dynamic Standing - Balance Support: During functional activity;No upper extremity supported Dynamic Standing - Level of Assistance: 3: Mod assist;2: Max assist Extremity Assessment  RUE Assessment RUE Assessment: Exceptions to [facility] RUE Body System: Neuro Brunstrum levels for arm and hand: Arm;Hand Brunstrum level for arm: Stage I Presynergy Brunstrum level for hand: Stage III Synergies performed voluntarily RUE Strength RUE Overall Strength Comments: Pt with some functional grasp, flaccid shoulder/, some forearm pronation/supination Right Shoulder Flexion: 0/5 Right Hand Gross Grasp: Impaired LUE Assessment LUE Assessment: Within Functional Limits RLE Assessment RLE Assessment: Exceptions to [facility] General Strength Comments: Pt demos trace hip abduction/external rotation. Otherwise grossly 0/5. LLE Assessment LLE Assessment: Within Functional Limits General Strength Comments: [facility]  Care Tool Care Tool Bed Mobility Roll left and right activity   Roll left and right assist level: Maximal Assistance - Patient 25 - 49%    Sit to lying activity   Sit to lying assist level: Maximal Assistance - Patient 25 - 49%    Lying to sitting edge of bed activity   Lying to sitting edge of bed assist level: Maximal Assistance - Patient 25 - 49%     Care Tool Transfers Sit to stand transfer   Sit to stand assist level: Moderate Assistance - Patient 50 - 74%    Chair/bed transfer   Chair/bed transfer assist level: Maximal Assistance - Patient 25 - 49%     Toilet transfer   Assist Level:  Maximal Assistance - Patient 24 - 49%    Car transfer   Car transfer assist level: Maximal Assistance - Patient 25 - 49%      Care Tool Locomotion Ambulation   Assist level: 2  helpers Assistive device: Other (comment) (Wall Rail) Max distance: 12'  Walk 10 feet activity   Assist level: 2 helpers Assistive device:  (Wall Rail)   Walk 50 feet with 2 turns activity Walk 50 feet with 2 turns activity did not occur: Safety/medical concerns      Walk 150 feet activity Walk 150 feet activity did not occur: Safety/medical concerns      Walk 10 feet on uneven surfaces activity Walk 10 feet on uneven surfaces activity did not occur: Safety/medical concerns      Stairs Stair activity did not occur: Safety/medical concerns        Walk up/down 1 step activity Walk up/down 1 step or curb (drop down) activity did not occur: Safety/medical concerns     Walk up/down 4 steps activity did not occuR: Safety/medical concerns  Walk up/down 4 steps activity      Walk up/down 12 steps activity Walk up/down 12 steps activity did not occur: Safety/medical concerns      Pick up small objects from floor Pick up small object from the floor (from standing position) activity did not occur: Safety/medical concerns      Wheelchair Will patient use wheelchair at discharge?: No          Wheel 50 feet with 2 turns activity      Wheel 150 feet activity        Refer to Care Plan for Long Term Goals  SHORT TERM GOAL WEEK 1 PT Short Term Goal 1 (Week 1): Pt will perform bed mobility with modA PT Short Term Goal 2 (Week 1): Pt will perform bed to chair transfer with modA PT Short Term Goal 3 (Week 1): Pt will ambulate 30' with modA and LRAD  Recommendations for other services: None   Skilled Therapeutic Intervention  Evaluation completed (see details above and below) with education on PT POC and goals and individual treatment initiated with focus on bed mobility balance, functional transfers,  and gait initiation.  Pt received supine in bed and agrees to therapy. No report of pain. Pt is verbal but only provides brief responses and requires increased processing time. Supine to sit with maxA and verbal and tactile cues for sequencing. MaxA for squat pivot transfer to L side from bed to WC. Pt performs sit to stand transfer from [facility], holding onto rail with L hand and with PT blocking RLE and pt's R arm over PT's shoulders. Pt requires full blocking of R knee due to tendency for buckling and no noted R quad or hamstring activation. MaxA for stand pivot transfer from WC<>car. Pt performs squat pivot from small WC to larger WC for improved fit with maxA from PT. Pt performs x12' ambulation holding onto wall rail with LUE and PT providing maxA to facilitate RLE extension during stance phase and progressing RLE during swing phase.  Pt left seated in WC with alarm intact and all needs within reach.   Mobility Bed Mobility Bed Mobility: Supine to Sit;Sit to Supine Supine to Sit: Maximal Assistance - Patient - Patient 25-49% Sit to Supine: Maximal Assistance - Patient 25-49% Transfers Transfers: Sit to Stand;Stand to Sit;Stand Pivot Transfers;Squat Pivot Transfers Sit to Stand: Moderate Assistance - Patient 50-74% Stand to Sit: Moderate Assistance - Patient 50-74% Stand Pivot Transfers: Maximal Assistance - Patient 25 - 49% Stand Pivot Transfer Details: Tactile cues for weight shifting;Verbal cues for technique;Verbal cues for precautions/safety;Verbal cues for sequencing;Manual facilitation for placement Locomotion  Gait Ambulation: Yes Gait Assistance: Maximal Assistance -  Patient 25-49% Assistive device: 1 person hand held assist;Other (Comment) (Handrail) Gait Assistance Details: Verbal cues for sequencing;Verbal cues for gait pattern;Manual facilitation for placement;Verbal cues for technique;Tactile cues for sequencing;Tactile cues for weight shifting;Tactile cues for weight  beaing Gait Gait: Yes Gait Pattern: Impaired Gait Pattern: Poor foot clearance - right;Step-to pattern;Decreased weight shift to right Gait velocity: decreased Stairs / Additional Locomotion Stairs: No Wheelchair Mobility Wheelchair Mobility: No   Discharge Criteria: Patient will be discharged from PT if patient refuses treatment 3 consecutive times without medical reason, if treatment goals not met, if there is a change in medical status, if patient makes no progress towards goals or if patient is discharged from hospital.  The above assessment, treatment plan, treatment alternatives and goals were discussed and mutually agreed upon: by patient  Breck Coons, PT, DPT 07/21/2020, 4:57 PM

## 2020-07-21 NOTE — Progress Notes (Signed)
Patient husband calle d spoken with RNLeotis Shames) regarding general concerns with spouse b/p , medication, and gaol and requesting to speak with providers in the morning . Reassurace provided per RN , will notify oncomming nurse and medical team r/t to these concerns

## 2020-07-21 NOTE — Progress Notes (Signed)
Spoke with Annie Main NP (stroke APP) regarding duration of Plavix--to start today and duration of treatment approx 6 months or per Dr. Excell Seltzer depending on closure of PFO. Will change order to continue indefinitely for now. Discontinue foley and start bladder training.

## 2020-07-21 NOTE — Progress Notes (Signed)
[place] PHYSICAL MEDICINE & REHABILITATION PROGRESS NOTE   Subjective/Complaints: Still with minimal right sided movement Foley d/ced Husband visiting later today. Has no complaints.  ROS: Limited due to aphasia   Objective:   VAS US TRANSCRANIAL DOPPLER W BUBBLES  Result Date: 07/20/2020  Transcranial Doppler with Bubble Indications: Stroke. Performing Technologist: Blanch MediaMegan Riddle RVS  Examination Guidelines: A complete evaluation includes B-mode imaging, spectral Doppler, color Doppler, and power Doppler as needed of all accessible portions of each vessel. Bilateral testing is considered an integral part of a complete examination. Limited examinations for reoccurring indications may be performed as noted.  Summary:  A vascular evaluation was performed. The left middle cerebral artery was studied. An IV was inserted into the patient's left forearm. Verbal informed consent was obtained.  Spencer grade 3- continuous hits on recording at rest & during valsalva. Positive TCD Bubble study indicative of moderate size right to left shunt *See table(s) above for TCD measurements and observations.  Diagnosing physician: Delia HeadyPramod Sethi MD Electronically signed by Delia HeadyPramod Sethi MD on 07/20/2020 at 4:34:22 PM.    Final    ECHO TEE  Result Date: 07/19/2020    TRANSESOPHOGEAL ECHO REPORT   Patient Name:   Tanya Lopez Date of Exam: 07/19/2020 Medical Rec #:  [id number]                    Height:       63.0 in Accession #:    [id number]                   Weight:       216.3 lb Date of Birth:  11/16/1971                     BSA:          1.999 m Patient Age:    49 years                     BP:           164/87 mmHg Patient Gender: F                            HR:           89 bpm. Exam Location:  Inpatient Procedure: Transesophageal Echo, Color Doppler, Cardiac Doppler and Saline            Contrast Bubble Study Indications:    Stoke I63.9  History:        Patient has prior history of Echocardiogram  examinations, most                 recent 07/17/2020. Risk Factors:Hypertension.  Sonographer:    Thurman Coyerasey Kirkpatrick RDCS (AE) Referring Phys: [id number] JILL D MCDANIEL PROCEDURE: The transesophogeal probe was passed without difficulty through the esophogus of the patient. Sedation performed by different physician. The patient was monitored while under deep sedation. Anesthestetic sedation was provided intravenously by Anesthesiology: 147.53mg  of Propofol. The patient's vital signs; including heart rate, blood pressure, and oxygen saturation; remained stable throughout the procedure. The patient developed no complications during the procedure. IMPRESSIONS  1. Left ventricular ejection fraction, by estimation, is 65 to 70%. The left ventricle has normal function. The left ventricle has no regional wall motion abnormalities.  2. Right ventricular systolic function is normal. The right ventricular size is normal.  3. No left atrial/left atrial appendage thrombus was  detected.  4. The mitral valve is normal in structure. Trivial mitral valve regurgitation. No evidence of mitral stenosis.  5. The aortic valve is tricuspid. Aortic valve regurgitation is trivial. No aortic stenosis is present.  6. Evidence of atrial level shunting detected by color flow Doppler. Agitated saline contrast bubble study was positive with shunting observed within 3-6 cardiac cycles suggestive of interatrial shunt. There is a small patent foramen ovale with predominantly right to left shunting across the atrial septum. Conclusion(s)/Recommendation(s): Normal biventricular function without evidence of hemodynamically significant valvular heart disease. FINDINGS  Left Ventricle: Left ventricular ejection fraction, by estimation, is 65 to 70%. The left ventricle has normal function. The left ventricle has no regional wall motion abnormalities. The left ventricular internal cavity size was normal in size. There is  no left ventricular hypertrophy. Right  Ventricle: The right ventricular size is normal. No increase in right ventricular wall thickness. Right ventricular systolic function is normal. Left Atrium: Left atrial size was normal in size. No left atrial/left atrial appendage thrombus was detected. Right Atrium: Right atrial size was normal in size. Pericardium: There is no evidence of pericardial effusion. Mitral Valve: The mitral valve is normal in structure. Normal mobility of the mitral valve leaflets. Trivial mitral valve regurgitation. No evidence of mitral valve stenosis. Tricuspid Valve: The tricuspid valve is normal in structure. Tricuspid valve regurgitation is not demonstrated. No evidence of tricuspid stenosis. Aortic Valve: The aortic valve is tricuspid. Aortic valve regurgitation is trivial. No aortic stenosis is present. Pulmonic Valve: The pulmonic valve was normal in structure. Pulmonic valve regurgitation is not visualized. No evidence of pulmonic stenosis. Aorta: The aortic root is normal in size and structure. IAS/Shunts: Evidence of atrial level shunting detected by color flow Doppler. Agitated saline contrast was given intravenously to evaluate for intracardiac shunting. Agitated saline contrast bubble study was positive with shunting observed within 3-6 cardiac cycles suggestive of interatrial shunt. A small patent foramen ovale is detected with predominantly right to left shunting across the atrial septum. Chilton Si MD Electronically signed by Chilton Si MD Signature Date/Time: 07/19/2020/3:59:36 PM    Final    Recent Labs    07/20/20 0350 07/21/20 0442  WBC 5.9 6.3  HGB 11.5* 11.8*  HCT 38.3 38.9  PLT 334 363   Recent Labs    07/20/20 0350 07/21/20 0442  NA 141 139  K 3.4* 3.5  CL 110 104  CO2 23 25  GLUCOSE 102* 107*  BUN 6 8  CREATININE 0.61 0.72  CALCIUM 8.9 9.0    Intake/Output Summary (Last 24 hours) at 07/21/2020 0923 Last data filed at 07/21/2020 0102 Gross per 24 hour  Intake --  Output  1725 ml  Net -1725 ml     Physical Exam: Vital Signs Blood pressure (!) 142/83, pulse 69, temperature 98.3 F (36.8 C), temperature source Oral, resp. rate 18, height 5\' 3"  (1.6 m), weight (!) 98.1 kg, SpO2 95 %.  General: Alert and oriented x 3, No apparent distress HEENT: Head is normocephalic, atraumatic, PERRLA, EOMI, sclera anicteric, oral mucosa pink and moist, dentition intact, ext ear canals clear,  Neck: Supple without JVD or lymphadenopathy Heart: Reg rate and rhythm. No murmurs rubs or gallops Chest: CTA bilaterally without wheezes, rales, or rhonchi; no distress Abdomen: Soft, non-tender, non-distended, bowel sounds positive. Extremities: No clubbing, cyanosis, or edema. Pulses are 2+ Skin: Clean and intact without signs of breakdown Neurological:     Mental Status: She is alert.  Comments: Global aphasia, ?expressive  > receptive  She is able to point and follow simple one/two step motor commands.  Does tends to nod yes to most questions.  Motor: Right hemiplegia Left upper extremity: 4 -/5 proximal distal  Left lower extremity: 4+/5 proximal to distal  Psychiatric:     Comments: Unable to assess due to aphasia   Assessment/Plan: 1. Functional deficits secondary to acute L ACA stroke which require 3+ hours per day of interdisciplinary therapy in a comprehensive inpatient rehab setting.  Physiatrist is providing close team supervision and 24 hour management of active medical problems listed below.  Physiatrist and rehab team continue to assess barriers to discharge/monitor patient progress toward functional and medical goals  Care Tool:  Bathing              Bathing assist       Upper Body Dressing/Undressing Upper body dressing   What is the patient wearing?: Hospital gown only    Upper body assist      Lower Body Dressing/Undressing Lower body dressing            Lower body assist       Toileting Toileting    Toileting assist        Transfers Chair/bed transfer  Transfers assist           Locomotion Ambulation   Ambulation assist              Walk 10 feet activity   Assist           Walk 50 feet activity   Assist           Walk 150 feet activity   Assist           Walk 10 feet on uneven surface  activity   Assist           Wheelchair     Assist               Wheelchair 50 feet with 2 turns activity    Assist            Wheelchair 150 feet activity     Assist          Blood pressure (!) 142/83, pulse 69, temperature 98.3 F (36.8 C), temperature source Oral, resp. rate 18, height 5\' 3"  (1.6 m), weight (!) 98.1 kg, SpO2 95 %.    Medical Problem List and Plan: 1. 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              -patient may shower             -ELOS/Goals: 14-18 days/Min A             Initial CIR evals today 2.  Antithrombotics: -DVT/anticoagulation:  Pharmaceutical: Lovenox             -antiplatelet therapy: On ASA, Plavix to start today for 6 months.  3. Pain Management: Tylenol prn.  4. Mood: LCSW to follow for evaluation and support.              -antipsychotic agents: N/A 5. Neuropsych: This patient maybe capable of making decisions on her own behalf. 6. Skin/Wound Care: Routine pressure relief measures.  7. Fluids/Electrolytes/Nutrition: Monitor I/Os. CMP ordered. 8. HTN: Norvasc, Bystolic. Monitor BP tid.  Monitor with increased mobility. 9. GERD: On Protonix.  10. Hyperlipidemia: Trig-[id number]-->330 11. Hypokalemia:              BMP normal on 7/29. No longer on supplement. Advised high potassium foods 12. Stress induced hyperglycemia: Hgb A1c-5.6.              Monitor with increased mobility 13: Neurogenic bladder: DC foley and begin bladder training.   LOS: 1 days A FACE TO FACE  EVALUATION WAS PERFORMED  Tanya Lopez 07/21/2020, 9:23 AM

## 2020-07-21 NOTE — Progress Notes (Signed)
Inpatient Rehabilitation  Patient information reviewed and entered into eRehab system by Cheyenne Bordeaux M. Lugene Beougher, M.A., CCC/SLP, PPS Coordinator.  Information including medical coding, functional ability and quality indicators will be reviewed and updated through discharge.    

## 2020-07-21 NOTE — Plan of Care (Signed)
Problem: RH Balance Goal: LTG: Patient will maintain dynamic sitting balance (OT) Description: LTG:  Patient will maintain dynamic sitting balance with assistance during activities of daily living (OT) Flowsheets (Taken 07/21/2020 1413) LTG: Pt will maintain dynamic sitting balance during ADLs with: Supervision/Verbal cueing Goal: LTG Patient will maintain dynamic standing with ADLs (OT) Description: LTG:  Patient will maintain dynamic standing balance with assist during activities of daily living (OT)  Flowsheets (Taken 07/21/2020 1413) LTG: Pt will maintain dynamic standing balance during ADLs with: Contact Guard/Touching assist   Problem: Sit to Stand Goal: LTG:  Patient will perform sit to stand in prep for activites of daily living with assistance level (OT) Description: LTG:  Patient will perform sit to stand in prep for activites of daily living with assistance level (OT) Flowsheets (Taken 07/21/2020 1413) LTG: PT will perform sit to stand in prep for activites of daily living with assistance level: Contact Guard/Touching assist   Problem: RH Eating Goal: LTG Patient will perform eating w/assist, cues/equip (OT) Description: LTG: Patient will perform eating with assist, with/without cues using equipment (OT) Flowsheets (Taken 07/21/2020 1413) LTG: Pt will perform eating with assistance level of: Set up assist    Problem: RH Grooming Goal: LTG Patient will perform grooming w/assist,cues/equip (OT) Description: LTG: Patient will perform grooming with assist, with/without cues using equipment (OT) Flowsheets (Taken 07/21/2020 1413) LTG: Pt will perform grooming with assistance level of: Set up assist    Problem: RH Bathing Goal: LTG Patient will bathe all body parts with assist levels (OT) Description: LTG: Patient will bathe all body parts with assist levels (OT) Flowsheets (Taken 07/21/2020 1413) LTG: Pt will perform bathing with assistance level/cueing: Contact Guard/Touching  assist   Problem: RH Dressing Goal: LTG Patient will perform upper body dressing (OT) Description: LTG Patient will perform upper body dressing with assist, with/without cues (OT). Flowsheets (Taken 07/21/2020 1413) LTG: Pt will perform upper body dressing with assistance level of: Supervision/Verbal cueing Goal: LTG Patient will perform lower body dressing w/assist (OT) Description: LTG: Patient will perform lower body dressing with assist, with/without cues in positioning using equipment (OT) Flowsheets (Taken 07/21/2020 1413) LTG: Pt will perform lower body dressing with assistance level of: Contact Guard/Touching assist   Problem: RH Toileting Goal: LTG Patient will perform toileting task (3/3 steps) with assistance level (OT) Description: LTG: Patient will perform toileting task (3/3 steps) with assistance level (OT)  Flowsheets (Taken 07/21/2020 1413) LTG: Pt will perform toileting task (3/3 steps) with assistance level: Contact Guard/Touching assist   Problem: RH Functional Use of Upper Extremity Goal: LTG Patient will use RT/LT upper extremity as a (OT) Description: LTG: Patient will use right/left upper extremity as a stabilizer/gross assist/diminished/nondominant/dominant level with assist, with/without cues during functional activity (OT) Flowsheets (Taken 07/21/2020 1413) LTG: Use of upper extremity in functional activities: RUE as gross assist level   Problem: RH Toilet Transfers Goal: LTG Patient will perform toilet transfers w/assist (OT) Description: LTG: Patient will perform toilet transfers with assist, with/without cues using equipment (OT) Flowsheets (Taken 07/21/2020 1413) LTG: Pt will perform toilet transfers with assistance level of: Contact Guard/Touching assist   Problem: RH Tub/Shower Transfers Goal: LTG Patient will perform tub/shower transfers w/assist (OT) Description: LTG: Patient will perform tub/shower transfers with assist, with/without cues using equipment  (OT) Flowsheets (Taken 07/21/2020 1413) LTG: Pt will perform tub/shower stall transfers with assistance level of: Contact Guard/Touching assist   Problem: RH Awareness Goal: LTG: Patient will demonstrate awareness during functional activites type of (OT)  Description: LTG: Patient will demonstrate awareness during functional activites type of (OT) Flowsheets (Taken 07/21/2020 1413) LTG: Patient will demonstrate awareness during functional activites type of (OT): Supervision

## 2020-07-21 NOTE — Evaluation (Signed)
Occupational Therapy Assessment and Plan  Patient Details  Name: Tanya Lopez MRN: [id number] Date of Birth: Jul 01, 1971  OT Diagnosis: cognitive deficits, hemiplegia affecting dominant side and muscle weakness (generalized) Rehab Potential: Rehab Potential (ACUTE ONLY): Excellent ELOS: 3-4 weeks   Today's Date: 07/21/2020 OT Individual Time: 7026-3785 OT Individual Time Calculation (min): 73 min     Hospital Problem: Principal Problem:   Acute L ACA, R cerebellar, L temporoparietal infarcts ([place])  Active Problems:   Acute ischemic left MCA stroke [facility])   Past Medical History:  Past Medical History:  Diagnosis Date  . Hypertension   . PFO (patent foramen ovale) 07/19/2020   Noted on TEE 07/19/2020   Past Surgical History:  Past Surgical History:  Procedure Laterality Date  . IR ANGIO INTRA EXTRACRAN SEL COM CAROTID INNOMINATE BILAT MOD SED  07/17/2020  . IR ANGIO VERTEBRAL SEL VERTEBRAL BILAT MOD SED  07/17/2020  . IR CT HEAD LTD  07/17/2020  . RADIOLOGY WITH ANESTHESIA N/A 07/16/2020   Procedure: IR WITH ANESTHESIA;  Surgeon: Luanne Bras, MD;  Location: [place];  Service: Radiology;  Laterality: N/A;  . TEE WITHOUT CARDIOVERSION N/A 07/19/2020   Procedure: TRANSESOPHAGEAL ECHOCARDIOGRAM (TEE);  Surgeon: Skeet Latch, MD;  Location: [facility] ENDOSCOPY;  Service: Cardiovascular;  Laterality: N/A;    Assessment & Plan Clinical Impression: Patient is a 49 y.o. year old female with recent admission to the hospital on 07/16/2020 with acute onset of left-sided weakness and aphasia.  Cranial CT scan showed focal subarachnoid hemorrhage in the medial posterior left frontal lobe.  CT angiogram of head and neck showed left A2 segment occlusion with poor reconstitution of distal branch vessels.  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  Patient did not receive TPA.  Admission chemistries unremarkable, urinalysis negative  nitrite, lactic acid 2.5.  Cerebral angiogram completed showing smooth areas of narrowing of left ACA A2 and branches of A2 suspicious for possible dissection but no cortical thrombosis.  Plan currently is to follow-up with interventional radiology 1 month for repeat CTA of head and neck.  Patient did initially require ventilatory support and extubated 07/17/2020.  Lower extremity Dopplers negative for DVT.  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  Small acute right cerebellar left occipital and right thalamic infarct.  Motion degraded head MRA with apparent improved left A2 segment flow compared to prior CTA.  Maintained on Cardene for blood pressure control.  Echocardiogram with ejection fraction of 75% no wall motion abnormalities. Patient currently maintained on aspirin 325 mg daily for CVA prophylaxis.  Subcutaneous heparin for DVT prophylaxis. TEE 7/27 positive PFO with right to left shunt at rest.  Patient transferred to CIR on 07/20/2020 .    Patient currently requires max with basic self-care skills secondary to muscle weakness, decreased cardiorespiratoy endurance, impaired timing and sequencing, abnormal tone, unbalanced muscle activation, ataxia and decreased coordination}, decreased attention, decreased awareness, decreased safety awareness and delayed processing and decreased sitting balance, decreased standing balance, decreased postural control, hemiplegia and decreased balance strategies.  Prior to hospitalization, patient could complete BADL with independent .  Patient will benefit from skilled intervention to increase independence with basic self-care skills prior to discharge home with care partner.  Anticipate patient will require 24 hour supervision and follow up home health.  OT - End of Session Endurance Deficit: Yes Endurance Deficit Description: Rest breaks within BADL tasks OT Assessment Rehab Potential (ACUTE ONLY): Excellent OT Patient demonstrates  impairments  in the following area(s): Balance;Cognition;Endurance;Motor;Perception;Safety OT Basic ADL's Functional Problem(s): Eating;Grooming;Bathing;Toileting;Dressing OT Transfers Functional Problem(s): Toilet;Tub/Shower OT Additional Impairment(s): Fuctional Use of Upper Extremity OT Plan OT Intensity: Minimum of 1-2 x/day, 45 to 90 minutes OT Frequency: 5 out of 7 days OT Duration/Estimated Length of Stay: 3-4 weeks OT Treatment/Interventions: Balance/vestibular training;Cognitive remediation/compensation;Community reintegration;Disease mangement/prevention;Discharge planning;DME/adaptive equipment instruction;Functional electrical stimulation;Functional mobility training;Neuromuscular re-education;Patient/family education;Psychosocial support;Self Care/advanced ADL retraining;Splinting/orthotics;Therapeutic Activities;Therapeutic Exercise;UE/LE Coordination activities;UE/LE Strength taining/ROM;Wheelchair propulsion/positioning OT Self Feeding Anticipated Outcome(s): Set-up A OT Basic Self-Care Anticipated Outcome(s): Supervision/;CGA OT Toileting Anticipated Outcome(s): CGA OT Bathroom Transfers Anticipated Outcome(s): CGA OT Recommendation Recommendations for Other Services: Therapeutic Recreation consult Therapeutic Recreation Interventions: Outing/community reintergration Patient destination: Home Follow Up Recommendations: Home health OT Equipment Recommended: To be determined   OT Evaluation Precautions/Restrictions  Precautions Precautions: Fall Precaution Comments: R hemiplegia Restrictions Weight Bearing Restrictions: No Pain  denies pain Home Living/Prior Functioning Home Living Family/patient expects to be discharged to:: Private residence Living Arrangements: Spouse/significant other Available Help at Discharge: Family, Available 24 hours/day Type of Home: House Home Access: Stairs to enter Technical brewer of Steps: 3 Entrance Stairs-Rails: None Home  Layout: One level Bathroom Shower/Tub: Gaffer, Charity fundraiser: Associate Professor Accessibility: Yes  Lives With: Spouse, Family IADL History Current License: Yes IADL Comments: Pt reports she enjoys doing social work Prior Function Level of Independence: Independent with homemaking with ambulation, Independent with basic ADLs  Able to Take Stairs?: Yes Driving: Yes Vision Baseline Vision/History: Wears glasses Wears Glasses: At all times Perception  Perception: Within Functional Limits Praxis Praxis: Intact Cognition Overall Cognitive Status: Impaired/Different from baseline Arousal/Alertness: Awake/alert Orientation Level: Person;Place;Situation Person: Oriented Place: Oriented Situation: Oriented Year: 2021 Month: July Day of Week: Correct Memory: Appears intact Immediate Memory Recall: Sock;Blue;Bed Memory Recall Sock: Without Cue Memory Recall Blue: Without Cue Memory Recall Bed: Without Cue Awareness: Impaired Problem Solving: Impaired Safety/Judgment: Appears intact Sensation Sensation Light Touch: Appears Intact Coordination Gross Motor Movements are Fluid and Coordinated: No Fine Motor Movements are Fluid and Coordinated: No Coordination and Movement Description: decreased smoothness and accuracy due to weakness Heel Shin Test: Unable to perform Motor  Motor Motor: Hemiplegia Motor - Skilled Clinical Observations: R Hemiparesis  Trunk/Postural Assessment  Cervical Assessment Cervical Assessment: Exceptions to [facility] (forward head) Thoracic Assessment Thoracic Assessment: Exceptions to [facility] (rounded shoulders) Lumbar Assessment Lumbar Assessment: Exceptions to [facility] (posterior pelvic tilt) Postural Control Postural Control: Deficits on evaluation Righting Reactions: delayed  Balance Balance Balance Assessed: Yes Static Sitting Balance Static Sitting - Balance Support: Feet supported Static Sitting - Level of Assistance: 4: Min  assist Dynamic Sitting Balance Dynamic Sitting - Balance Support: During functional activity;Feet supported Dynamic Sitting - Level of Assistance: 4: Min assist;3: Mod assist Static Standing Balance Static Standing - Balance Support: During functional activity;Bilateral upper extremity supported Static Standing - Level of Assistance: 3: Mod assist;4: Min assist Dynamic Standing Balance Dynamic Standing - Balance Support: During functional activity;No upper extremity supported Dynamic Standing - Level of Assistance: 3: Mod assist;2: Max assist Extremity/Trunk Assessment RUE Assessment RUE Assessment: Exceptions to [facility] RUE Body System: Neuro Brunstrum levels for arm and hand: Arm;Hand Brunstrum level for arm: Stage I Presynergy Brunstrum level for hand: Stage III Synergies performed voluntarily RUE Strength RUE Overall Strength Comments: Pt with some functional grasp, flaccid shoulder/, some forearm pronation/supination Right Shoulder Flexion: 0/5 Right Hand Gross Grasp: Impaired LUE Assessment LUE Assessment: Within Functional Limits  Care Tool Care Tool Self Care Eating   Eating Assist Level: Minimal  Assistance - Patient > 75%    Oral Care    Oral Care Assist Level: Minimal Assistance - Patient > 75%    Bathing   Body parts bathed by patient: Right arm;Chest;Abdomen;Front perineal area;Right upper leg;Left upper leg;Face Body parts bathed by helper: Left lower leg;Right lower leg;Buttocks;Left arm   Assist Level: Maximal Assistance - Patient 24 - 49%    Upper Body Dressing(including orthotics)   What is the patient wearing?: Pull over shirt   Assist Level: Maximal Assistance - Patient 25 - 49%    Lower Body Dressing (excluding footwear)   What is the patient wearing?: Pants Assist for lower body dressing: Maximal Assistance - Patient 25 - 49%    Putting on/Taking off footwear   What is the patient wearing?: Non-skid slipper socks Assist for footwear: Total Assistance  - Patient < 25%       Care Tool Toileting Toileting activity   Assist for toileting: Maximal Assistance - Patient 25 - 49%      Care Tool Transfers Sit to stand transfer   Sit to stand assist level: Maximal Assistance - Patient 25 - 49%    Chair/bed transfer   Chair/bed transfer assist level: Maximal Assistance - Patient 25 - 49%     Toilet transfer   Assist Level: Maximal Assistance - Patient 24 - 49%     Care Tool Cognition Expression of Ideas and Wants Expression of Ideas and Wants: Frequent difficulty - frequently exhibits difficulty with expressing needs and ideas   Understanding Verbal and Non-Verbal Content Understanding Verbal and Non-Verbal Content: Usually understands - understands most conversations, but misses some part/intent of message. Requires cues at times to understand   Memory/Recall Ability *first 3 days only Memory/Recall Ability *first 3 days only: Current season;Location of own room;That he or she is in a hospital/hospital unit    Refer to Care Plan for [place] 1 OT Short Term Goal 1 (Week 1): Pt will complete toilet transfer with mod A of 1 OT Short Term Goal 2 (Week 1): Pt will maintain standing balance within bADL task with mod A OT Short Term Goal 3 (Week 1): Pt will complete 1 step of LB dressing task  Recommendations for other services: Therapeutic Recreation  Outing/community reintegration   Skilled Therapeutic Intervention Initial eval completed with treatment provided to address functional transfers, functional use of R UE, improved sit<>stand, standing tolerance, and adapted bathing/dressing skills. Pt came to sitting EOB with max A. Stand-pivot transfers bed>wc with overall ,Max A. Incorporated R NMR weight bearing techniques within bathing tasks with hand over hand A to bring wash cloth across body. Pt with some functional grasp and some forearm pronation/supination, but flaccid elbow/shoulder. Mod/max A sit<>stand  with mod/max A for balance with R knee block to maintain standing. LB/UB dressing completed wc level at the sink with assistance to thread R LE into clothing and assist to pull up pants. UB dressing with max A overall 2/2 R hemiplegia. Incorporate weight bearing through R UE when standing at the sink. OT addressed rehab process, OT purpose, POC, ELOS, and goals. Pt completed squat-pivot back to bed with max A. PT left semi-reclined in bed with bed alarm on, call bell in reach, and needs met.   ADL ADL Eating: Minimal assistance Grooming: Minimal assistance Upper Body Bathing: Moderate assistance Lower Body Bathing: Maximal assistance Upper Body Dressing: Maximal assistance Lower Body Dressing: Maximal assistance Toileting: Maximal assistance Toilet Transfer: Maximal assistance Mobility  Bed Mobility Bed Mobility: Supine to Sit;Sit to Supine Supine to Sit: Maximal Assistance - Patient - Patient 25-49% Sit to Supine: Maximal Assistance - Patient 25-49% Transfers Sit to Stand: Moderate Assistance - Patient 50-74% Stand to Sit: Moderate Assistance - Patient 50-74%   Discharge Criteria: Patient will be discharged from OT if patient refuses treatment 3 consecutive times without medical reason, if treatment goals not met, if there is a change in medical status, if patient makes no progress towards goals or if patient is discharged from hospital.  The above assessment, treatment plan, treatment alternatives and goals were discussed and mutually agreed upon: by patient  Valma Cava 07/21/2020, 2:15 PM

## 2020-07-21 NOTE — Plan of Care (Signed)
  Problem: RH BOWEL ELIMINATION Goal: RH STG MANAGE BOWEL WITH ASSISTANCE Description: STG Manage Bowel with min Assistance. Outcome: Not Progressing; constipation;

## 2020-07-21 NOTE — Evaluation (Addendum)
Speech Language Pathology Assessment and Plan  Patient Details  Name: Tanya Lopez MRN: [id number] Date of Birth: 26-Jun-1971  SLP Diagnosis: Aphasia  Rehab Potential: Good ELOS: 3-4 weeks    Today's Date: 07/21/2020 SLP Individual Time: 1002-1100 SLP Individual Time Calculation (min): 23 min   Hospital Problem: Principal Problem:   Acute L ACA, R cerebellar, L temporoparietal infarcts ([place])  Active Problems:   Acute ischemic left MCA stroke [facility])  Past Medical History:  Past Medical History:  Diagnosis Date  . Hypertension   . PFO (patent foramen ovale) 07/19/2020   Noted on TEE 07/19/2020   Past Surgical History:  Past Surgical History:  Procedure Laterality Date  . IR ANGIO INTRA EXTRACRAN SEL COM CAROTID INNOMINATE BILAT MOD SED  07/17/2020  . IR ANGIO VERTEBRAL SEL VERTEBRAL BILAT MOD SED  07/17/2020  . IR CT HEAD LTD  07/17/2020  . RADIOLOGY WITH ANESTHESIA N/A 07/16/2020   Procedure: IR WITH ANESTHESIA;  Surgeon: Luanne Bras, MD;  Location: [place];  Service: Radiology;  Laterality: N/A;  . TEE WITHOUT CARDIOVERSION N/A 07/19/2020   Procedure: TRANSESOPHAGEAL ECHOCARDIOGRAM (TEE);  Surgeon: Skeet Latch, MD;  Location: [facility] ENDOSCOPY;  Service: Cardiovascular;  Laterality: N/A;    Assessment / Plan / Recommendation Clinical Impression Patient is a 49 y.o. year old female with recent admission to the hospital on 07/16/2020 with acute onset of left-sided weakness and aphasia. Cranial CT scan showed focal subarachnoid hemorrhage in the medial posterior left frontal lobe. CT angiogram of head and neck showed left A2 segment occlusion with poor reconstitution of distal branch vessels. 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 Patient did not receive TPA. Admission chemistries unremarkable, urinalysis negative nitrite, lactic acid 2.5. Cerebral angiogram completed showing smooth areas of narrowing of  left ACA A2and branches of A2 suspicious for possible dissection but no cortical thrombosis. Plan currently is to follow-up with interventional radiology 1 month for repeat CTA of head and neck. Patient did initially require ventilatory support and extubated 07/17/2020. Lower extremity Dopplers negative for DVT. 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 Small acute right cerebellar left occipital and right thalamic infarct. Motion degraded head MRA with apparent improved left A2 segment flow compared to prior CTA. Maintained on Cardene for blood pressure control. Echocardiogram with ejection fraction of 75% no wall motion abnormalities. Patient currently maintained on aspirin 325 mg daily for CVA prophylaxis. Subcutaneous heparin for DVT prophylaxis.TEE 7/27 positive PFO with right to left shunt at rest.  Patient transferred to CIR on 07/20/2020 .    Pt presents with moderate expressive aphasia, characterized by limited spontaneous speech with ability to respond at word and simple phrase level. Pt demonstrates awareness of word finding deficits in which extra time, along with sentence completion cues and witting at the phrase level were found to be successful in expressing thoughts. Pt's expressive abilities include responding to social greetings, response to complex yes/no questions, repetiting at the sentence level, naming objects in room/LARK tool kit and read description of object function and fill in object's name. Pt was able to describe function of common objects and response to open ended questions pertaining to self/environment with extra time. Pt is able to follow 2 to 3 step commands, read at sentence level and write name/address/dictated sentences and express thought at the phrase level.   Pt was orientated to place, situation and time with exception of day of week. SLP did not administer full cognitive linguistic  assessment due to limited time and primary focus of  assessing language abilities. Cognistat subsection, construction task was given to assess semi-complex problem solving skills and pt scored WFL. Cognitive linguistic skills appear WFL, noting selective attention, short term recall, emergent awareness and semi-complex problem solving, however further higher level assessment is warranted due to deficits noted in OT evaluation note. Pt's husband was present for a proton of the evaluation today and noting dramatic increase in verbal output in response to questions, but limited spontaeous speech and difficulty processing language.   Pt presents with functional oral motor skills and swallow function given regular textures and thin liquids via cup trials. SLP recommends intermittent supervision due to noting impulsivity per chart review, however not further treatment for dysphagia noted.  SLP is focus on language skills and further assessment of higher level cognitive skills during pt's CIR admission. Pt would benefit from skills ST services in order to maximize functional independence and reduce burden of care, requiring 24 hour supervision and continued ST services.   Skilled Therapeutic Interventions          Skilled ST services focused on language  skills. SLP facilitated cognitive linguistic assessment, provided education on current deficits and plan for treatment. All questions answered to satisfaction. Pt was left in room with call bell within reach and bed alarm set. ST recommends to continue skilled ST services.   SLP Assessment  Patient will need skilled Speech Lanaguage Pathology Services during CIR admission    Recommendations  SLP Diet Recommendations: Thin Liquid Administration via: Cup;Straw Medication Administration: Whole meds with liquid Supervision: Patient able to self feed;Intermittent supervision to cue for compensatory strategies (Due reports of cognitive deficits) Compensations: Minimize environmental distractions;Small  sips/bites;Lingual sweep for clearance of pocketing Postural Changes and/or Swallow Maneuvers: Seated upright 90 degrees Oral Care Recommendations: Oral care BID Patient destination: Home Follow up Recommendations: 24 hour supervision/assistance;Outpatient SLP Equipment Recommended: None recommended by SLP    SLP Frequency 3 to 5 out of 7 days   SLP Duration  SLP Intensity  SLP Treatment/Interventions 3-4 weeks  Minumum of 1-2 x/day, 30 to 90 minutes  Cueing hierarchy;Speech/Language facilitation    Pain Pain Assessment Faces Pain Scale: No hurt  Prior Functioning Cognitive/Linguistic Baseline: Within functional limits Type of Home: House  Lives With: Spouse;Family Available Help at Discharge: Family;Available 24 hours/day  SLP Evaluation Cognition Overall Cognitive Status: Impaired/Different from baseline Arousal/Alertness: Awake/alert Orientation Level: Oriented X4 (exepect for day of the week) Attention: Sustained;Selective Sustained Attention: Appears intact Selective Attention: Appears intact Memory: Appears intact Immediate Memory Recall: Sock;Blue;Bed Memory Recall Sock: Without Cue Memory Recall Blue: Without Cue Memory Recall Bed: Without Cue Awareness: Appears intact (verbal error awareness) Problem Solving: Appears intact (construction task on cognistat) Safety/Judgment: Appears intact  Comprehension Auditory Comprehension Overall Auditory Comprehension: Appears within functional limits for tasks assessed Yes/No Questions: Within Functional Limits Commands: Within Functional Limits (up to 3 step commands) Conversation: Simple (only in response to questions) EffectiveTechniques: Extra processing time Visual Recognition/Discrimination Discrimination: Within Function Limits Reading Comprehension Reading Status: Within funtional limits (up to sentence level) Expression Expression Primary Mode of Expression: Verbal Verbal Expression Overall Verbal  Expression: Impaired Initiation: Impaired Automatic Speech: Name;Social Response Level of Generative/Spontaneous Verbalization: Phrase;Word Repetition: No impairment Level of Impairment: Phrase level;Word level Naming: Impairment Confrontation: Within functional limits Divergent: 0-24% accurate (5 animals in 1 minute) Verbal Errors: Aware of errors Pragmatics: No impairment Effective Techniques: Written cues;Open ended questions;Sentence completion Written Expression Dominant Hand: Right Written Expression: Within  Functional Limits (up to simple phrase) Oral Motor Oral Motor/Sensory Function Overall Oral Motor/Sensory Function: Mild impairment Facial ROM: Reduced right Facial Symmetry: Abnormal symmetry right Facial Strength: Within Functional Limits Facial Sensation: Reduced right Lingual ROM: Reduced right Lingual Symmetry: Abnormal symmetry right Lingual Strength: Within Functional Limits Lingual Sensation: Within Functional Limits Mandible: Within Functional Limits Motor Speech Overall Motor Speech: Appears within functional limits for tasks assessed Respiration: Within functional limits Phonation: Normal Resonance: Within functional limits Articulation: Within functional limitis Motor Planning: Witnin functional limits Motor Speech Errors: Not applicable  Care Tool Care Tool Cognition Expression of Ideas and Wants Expression of Ideas and Wants: Frequent difficulty - frequently exhibits difficulty with expressing needs and ideas   Understanding Verbal and Non-Verbal Content Understanding Verbal and Non-Verbal Content: Usually understands - understands most conversations, but misses some part/intent of message. Requires cues at times to understand (extra time)   Memory/Recall Ability *first 3 days only Memory/Recall Ability *first 3 days only: Current season;Location of own room;That he or she is in a hospital/hospital unit     PMSV Assessment  PMSV Trial     Bedside Swallowing Assessment General Date of Onset: 07/16/20 Diet Prior to this Study: Regular;Thin liquids History of Recent Intubation: Yes Length of Intubations (days): 1 days Date extubated: 07/17/20 Behavior/Cognition: Alert;Cooperative;Pleasant mood Oral Cavity - Dentition: Adequate natural dentition Self-Feeding Abilities: Able to feed self Vision: Functional for self-feeding Patient Positioning: Upright in bed Baseline Vocal Quality: Normal Volitional Cough: Weak Volitional Swallow: Able to elicit  Oral Care Assessment Does patient have any of the following "high(er) risk" factors?: None of the above Does patient have any of the following "at risk" factors?: None of the above Patient is LOW RISK: Follow universal precautions (see row information) Ice Chips Ice chips: Not tested Thin Liquid Presentation: Cup;Self Fed Nectar Thick Nectar Thick Liquid: Not tested Honey Thick   Puree Puree: Not tested Solid Solid: Within functional limits Presentation: Self Fed BSE Assessment Risk for Aspiration Impact on safety and function: Mild aspiration risk Other Related Risk Factors: Cognitive impairment (noted in OT evaluation)  Short Term Goals: Week 1: SLP Short Term Goal 1 (Week 1): Pt will express wants/needs at phrase level with min A multimodal cues. SLP Short Term Goal 2 (Week 1): Pt will increase verbal output to sentence level in structured tasks with min A semantic cues. SLP Short Term Goal 3 (Week 1): Pt will utilize word finding strategies in structured tasks with min A verbal cues.  Refer to Care Plan for Long Term Goals  Recommendations for other services: None   Discharge Criteria: Patient will be discharged from SLP if patient refuses treatment 3 consecutive times without medical reason, if treatment goals not met, if there is a change in medical status, if patient makes no progress towards goals or if patient is discharged from hospital.  The above  assessment, treatment plan, treatment alternatives and goals were discussed and mutually agreed upon: by patient  Juelz Claar  [facility] 07/21/2020, 5:13 PM

## 2020-07-22 ENCOUNTER — Inpatient Hospital Stay (HOSPITAL_COMMUNITY): Payer: BC Managed Care – PPO | Admitting: Occupational Therapy

## 2020-07-22 ENCOUNTER — Telehealth: Payer: Self-pay

## 2020-07-22 ENCOUNTER — Inpatient Hospital Stay (HOSPITAL_COMMUNITY): Payer: BC Managed Care – PPO | Admitting: Physical Therapy

## 2020-07-22 ENCOUNTER — Inpatient Hospital Stay (HOSPITAL_COMMUNITY): Payer: BC Managed Care – PPO | Admitting: Speech Pathology

## 2020-07-22 DIAGNOSIS — I63522 Cerebral infarction due to unspecified occlusion or stenosis of left anterior cerebral artery: Secondary | ICD-10-CM | POA: Diagnosis not present

## 2020-07-22 LAB — URINALYSIS, ROUTINE W REFLEX MICROSCOPIC
Bilirubin Urine: NEGATIVE
Glucose, UA: NEGATIVE mg/dL
Ketones, ur: NEGATIVE mg/dL
Nitrite: POSITIVE — AB
Protein, ur: 30 mg/dL — AB
Specific Gravity, Urine: 1.02 (ref 1.005–1.030)
pH: 5 (ref 5.0–8.0)

## 2020-07-22 MED ORDER — CEPHALEXIN 250 MG PO CAPS
500.0000 mg | ORAL_CAPSULE | Freq: Two times a day (BID) | ORAL | Status: DC
  Administered 2020-07-22 – 2020-07-24 (×4): 500 mg via ORAL
  Filled 2020-07-22 (×4): qty 2

## 2020-07-22 MED ORDER — MELATONIN 3 MG PO TABS
3.0000 mg | ORAL_TABLET | Freq: Every evening | ORAL | Status: DC | PRN
  Administered 2020-07-22 – 2020-07-24 (×3): 3 mg via ORAL
  Filled 2020-07-22 (×3): qty 1

## 2020-07-22 NOTE — Telephone Encounter (Signed)
Per Dr. Roda Shutters, called patient to arrange PFO consult with Dr. Excell Seltzer in a couple weeks once discharged from rehab.  The mailbox is full and unable to leave message. Will try again later.

## 2020-07-22 NOTE — Progress Notes (Signed)
Speech Language Pathology Daily Session Note  Patient Details  Name: LORINA DUFFNER MRN: [id number] Date of Birth: 03-Mar-1971  Today's Date: 07/22/2020 SLP Individual Time: 1335-1415 SLP Individual Time Calculation (min): 40 min  Short Term Goals: Week 1: SLP Short Term Goal 1 (Week 1): Pt will express wants/needs at phrase level with min A multimodal cues. SLP Short Term Goal 2 (Week 1): Pt will increase verbal output to sentence level in structured tasks with min A semantic cues. SLP Short Term Goal 3 (Week 1): Pt will utilize word finding strategies in structured tasks with min A verbal cues.  Skilled Therapeutic Interventions:  Pt was seen for skilled ST targeting ongoing diagnostic treatment of cognition.  SLP administered portions of the Cognitive-Linguistic Quick Test with pt scoring at or above cut scores in confrontational naming, clock drawing, symbol cancellation, and symbol trails subtests.  Functionally, pt presents with decreased safety awareness as evidenced by slight impulsivity during transfers but was able to be redirected with min assist verbal cues.  Pt was left in bed with bed alarm set and call bell within reach.  Continue per current plan of care.  Pain Pain Assessment Pain Scale: 0-10 Pain Score: 0-No pain  Therapy/Group: Individual Therapy  Annalysse Shoemaker, Melanee Spry 07/22/2020, 4:47 PM

## 2020-07-22 NOTE — IPOC Note (Signed)
Overall Plan of Care [facility]) Patient Details Name: Tanya Lopez MRN: [id number] DOB: 14-Mar-1971  Admitting Diagnosis: Acute ischemic left anterior cerebral artery (ACA) stroke [facility])  Hospital Problems: Principal Problem:   Acute L ACA, R cerebellar, L temporoparietal infarcts ([facility])  Active Problems:   Acute ischemic left MCA stroke ([facility])     Functional Problem List: Nursing Bladder, Bowel, Endurance, Medication Management, Pain, Safety, Skin Integrity  PT Balance, Endurance, Motor, Pain, Safety  OT Balance, Cognition, Endurance, Motor, Perception, Safety  SLP    TR         Basic ADL's: OT Eating, Grooming, Bathing, Toileting, Dressing     Advanced  ADL's: OT       Transfers: PT Bed Mobility, Bed to Chair, Customer service manager, Tub/Shower     Locomotion: PT Ambulation, Stairs     Additional Impairments: OT Fuctional Use of Upper Extremity  SLP Communication expression    TR      Anticipated Outcomes Item Anticipated Outcome  Self Feeding Set-up A  Swallowing      Basic self-care  Supervision/;CGA  Toileting  CGA   Bathroom Transfers CGA  Bowel/Bladder  Min assist  Transfers  Supervision  Locomotion  MinA  Communication  Supervision A  Cognition     Pain  <3 on a 0-10 pain scale  Safety/Judgment  Min assist   Therapy Plan: PT Intensity: Minimum of 1-2 x/day ,45 to 90 minutes PT Frequency: 5 out of 7 days PT Duration Estimated Length of Stay: 21-24 days OT Intensity: Minimum of 1-2 x/day, 45 to 90 minutes OT Frequency: 5 out of 7 days OT Duration/Estimated Length of Stay: 3-4 weeks SLP Intensity: Minumum of 1-2 x/day, 30 to 90 minutes SLP Frequency: 3 to 5 out of 7 days SLP Duration/Estimated Length of Stay: 3-4 weeks   Due to the current state of emergency, patients may not be receiving their 3-hours of Medicare-mandated therapy.   Team Interventions: Nursing Interventions Patient/Family Education, Bladder Management, Bowel Management,  Disease Management/Prevention, Pain Management, Medication Management, Skin Care/Wound Management, Discharge Planning, Psychosocial Support  PT interventions Ambulation/gait training, Community reintegration, DME/adaptive equipment instruction, Neuromuscular re-education, Psychosocial support, Stair training, UE/LE Strength taining/ROM, Wheelchair propulsion/positioning, Warden/ranger, Discharge planning, Functional electrical stimulation, Pain management, Skin care/wound management, Therapeutic Activities, UE/LE Coordination activities, Cognitive remediation/compensation, Functional mobility training, Disease management/prevention, Patient/family education, Splinting/orthotics, Therapeutic Exercise, Visual/perceptual remediation/compensation  OT Interventions Warden/ranger, Cognitive remediation/compensation, Community reintegration, Disease mangement/prevention, Discharge planning, DME/adaptive equipment instruction, Functional electrical stimulation, Functional mobility training, Neuromuscular re-education, Patient/family education, Psychosocial support, Self Care/advanced ADL retraining, Splinting/orthotics, Therapeutic Activities, Therapeutic Exercise, UE/LE Coordination activities, UE/LE Strength taining/ROM, Wheelchair propulsion/positioning  SLP Interventions Cueing hierarchy, Speech/Language facilitation  TR Interventions    SW/CM Interventions     Barriers to Discharge MD  Medical stability  Nursing Decreased caregiver support, Incontinence, Lack of/limited family support, Medication compliance    PT Home environment access/layout    OT      SLP      SW       Team Discharge Planning: Destination: PT-Home ,OT- Home , SLP-Home Projected Follow-up: PT-Home health PT, 24 hour supervision/assistance, OT-  Home health OT, SLP-24 hour supervision/assistance, Outpatient SLP Projected Equipment Needs: PT-To be determined, OT- To be determined, SLP-None recommended  by SLP Equipment Details: PT- , OT-  Patient/family involved in discharge planning: PT- Patient,  OT-Patient, SLP-Patient, Patient unable/family or caregive not available  MD ELOS: 14-18 days MinA Medical Rehab Prognosis:  Excellent Assessment: Tanya Lopez  is a 49 year old woman who was admitted to CIR for globally, expressive >receptive aphasia with minimal verbal output and right flaccid hemiparesis affecting mobility and ADLs secondary to left >right brain infarcts. Labs are monitored weekly to assess hypokalemia and electrolytes. BP will be monitored three times per day. CBGs will be monitored AC/HS. UA was positive for UTI for which he have started Keflex.   See Team Conference Notes for weekly updates to the plan of care

## 2020-07-22 NOTE — Plan of Care (Signed)
  Problem: Consults °Goal: RH STROKE PATIENT EDUCATION °Description: See Patient Education module for education specifics  °Outcome: Progressing °  °Problem: RH BOWEL ELIMINATION °Goal: RH STG MANAGE BOWEL WITH ASSISTANCE °Description: STG Manage Bowel with min Assistance. °Outcome: Progressing °Goal: RH STG MANAGE BOWEL W/MEDICATION W/ASSISTANCE °Description: STG Manage Bowel with Medication with min Assistance. °Outcome: Progressing °  °Problem: RH SKIN INTEGRITY °Goal: RH STG SKIN FREE OF INFECTION/BREAKDOWN °Description: Skin to remain free from breakdown with min assist while on rehab. °Outcome: Progressing °  °Problem: RH PAIN MANAGEMENT °Goal: RH STG PAIN MANAGED AT OR BELOW PT'S PAIN GOAL °Description: <4 on a 0-10 pain scale. °Outcome: Progressing °  °Problem: RH SAFETY °Goal: RH STG ADHERE TO SAFETY PRECAUTIONS W/ASSISTANCE/DEVICE °Description: STG Adhere to Safety Precautions With min Assistance and appropriate assistive Device. °Outcome: Progressing °  °

## 2020-07-22 NOTE — Progress Notes (Signed)
Inpatient Rehabilitation Center Individual Statement of Services  Patient Name:  Tanya Lopez  Date:  07/22/2020  Welcome to the Inpatient Rehabilitation Center.  Our goal is to provide you with an individualized program based on your diagnosis and situation, designed to meet your specific needs.  With this comprehensive rehabilitation program, you will be expected to participate in at least 3 hours of rehabilitation therapies Monday-Friday, with modified therapy programming on the weekends.  Your rehabilitation program will include the following services:  Physical Therapy (PT), Occupational Therapy (OT), Speech Therapy (ST), 24 hour per day rehabilitation nursing, Therapeutic Recreaction (TR), Psychology, Neuropsychology, Care Coordinator, Rehabilitation Medicine, Nutrition Services, Pharmacy Services and Other  Weekly team conferences will be held on Tuesdays to discuss your progress.  Your Inpatient Rehabilitation Care Coordinator will talk with you frequently to get your input and to update you on team discussions.  Team conferences with you and your family in attendance may also be held.  Expected length of stay: 3-4 weeks   Overall anticipated outcome: Supervision  Depending on your progress and recovery, your program may change. Your Inpatient Rehabilitation Care Coordinator will coordinate services and will keep you informed of any changes. Your Inpatient Rehabilitation Care Coordinator's name and contact numbers are listed  below.  The following services may also be recommended but are not provided by the Inpatient Rehabilitation Center:   Driving Evaluations  Home Health Rehabiltiation Services  Outpatient Rehabilitation Services  Vocational Rehabilitation   Arrangements will be made to provide these services after discharge if needed.  Arrangements include referral to agencies that provide these services.  Your insurance has been verified to be:  [employer]  Your primary  doctor is:  No PCP  Pertinent information will be shared with your doctor and your insurance company.  Inpatient Rehabilitation Care Coordinator:  Susie Cassette [phone] or [phone]  Information discussed with and copy given to patient by: Gretchen Short, 07/22/2020, 3:48 PM

## 2020-07-22 NOTE — Progress Notes (Signed)
Occupational Therapy Session Note  Patient Details  Name: Tanya Lopez MRN: [id number] Date of Birth: 11-22-1971  Today's Date: 07/22/2020 OT Individual Time: 1886-7737 OT Individual Time Calculation (min): 60 min    Short Term Goals: Week 1:  OT Short Term Goal 1 (Week 1): Pt will complete toilet transfer with mod A of 1 OT Short Term Goal 2 (Week 1): Pt will maintain standing balance within bADL task with mod A OT Short Term Goal 3 (Week 1): Pt will complete 1 step of LB dressing task  Skilled Therapeutic Interventions/Progress Updates:    Pt received in bed agreeable to therapy and a shower. Pt able to answer simple questions with more consistency, more verbal.   Pt placed in bed flat to first work on [place] with over head hand drops to facilitate triceps. Pt able to hold arm extended for a few seconds at a time and even actively push elbow into extension.  A/arom of sh protraction with punching hand to ceiling. Had pt do self ROM for elbows with hands clasped while I obtained supplies for her shower.   Pt rolled onto L side and sat to EOB with min A and tactile cues.  Used stedy to transfer to shower bench with only CGA of 1. Pt able to actively lift hand towards bar about 25 % of range.  On bench, pt bathed with mod A.  Transferred back to wc to dress with mod A. Sit to stand with support at R knee with min-mod A and held balance with mod A.    Place RUE on tray table for support and to encourage pt to actively move elbow.   Pt in wc with belt alarm on and all needs met.    Therapy Documentation Precautions:  Precautions Precautions: Fall Precaution Comments: R hemiplegia Restrictions Weight Bearing Restrictions: No   Pain: Pain Assessment Pain Score: 0-No pain ADL: ADL Eating: Minimal assistance Grooming: Minimal assistance Upper Body Bathing: Moderate assistance Lower Body Bathing: Maximal assistance Upper Body Dressing: Maximal assistance Lower Body  Dressing: Maximal assistance Toileting: Maximal assistance Toilet Transfer: Maximal assistance   Therapy/Group: Individual Therapy  [place] 07/22/2020, 12:22 PM

## 2020-07-22 NOTE — Progress Notes (Signed)
[name] PHYSICAL MEDICINE & REHABILITATION PROGRESS NOTE   Subjective/Complaints: No complaints this morning. Does not have much appetite for lunch. Discussed bladder training plan with patient. UA positive- Keflex started Tanya Lopez told me husband has some questions- called him but he does not have voicemail box set up.   ROS: Limited due to aphasia   Objective:   No results found. Recent Labs    07/20/20 0350 07/21/20 0442  WBC 5.9 6.3  HGB 11.5* 11.8*  HCT 38.3 38.9  PLT 334 363   Recent Labs    07/20/20 0350 07/21/20 0442  NA 141 139  K 3.4* 3.5  CL 110 104  CO2 23 25  GLUCOSE 102* 107*  BUN 6 8  CREATININE 0.61 0.72  CALCIUM 8.9 9.0    Intake/Output Summary (Last 24 hours) at 07/22/2020 1323 Last data filed at 07/22/2020 0730 Gross per 24 hour  Intake 60 ml  Output 600 ml  Net -540 ml     Physical Exam: Vital Signs Blood pressure (!) 137/85, pulse 70, temperature 99.1 F (37.3 C), temperature source Oral, resp. rate 17, height 5\' 3"  (1.6 m), weight (!) 98.1 kg, SpO2 97 %. General: Alert and oriented x 3, No apparent distress HEENT: Head is normocephalic, atraumatic, PERRLA, EOMI, sclera anicteric, oral mucosa pink and moist, dentition intact, ext ear canals clear,  Neck: Supple without JVD or lymphadenopathy Heart: Reg rate and rhythm. No murmurs rubs or gallops Chest: CTA bilaterally without wheezes, rales, or rhonchi; no distress Abdomen: Soft, non-tender, non-distended, bowel sounds positive. Extremities: No clubbing, cyanosis, or edema. Pulses are 2+ Skin: Clean and intact without signs of breakdown Neurological:     Mental Status: She is alert.     Comments: Global aphasia, ?expressive  > receptive  She is able to point and follow simple one/two step motor commands.  Does tends to nod yes to most questions.  Motor: Right hemiplegia Left upper extremity: 4 -/5 proximal distal  Left lower extremity: 4+/5 proximal to distal  Psychiatric:      Comments: Unable to assess due to aphasia   Assessment/Plan: 1. Functional deficits secondary to acute L ACA stroke which require 3+ hours per day of interdisciplinary therapy in a comprehensive inpatient rehab setting.  Physiatrist is providing close team supervision and 24 hour management of active medical problems listed below.  Physiatrist and rehab team continue to assess barriers to discharge/monitor patient progress toward functional and medical goals  Care Tool:  Bathing    Body parts bathed by patient: Right arm, Chest, Abdomen, Front perineal area, Right upper leg, Left upper leg, Face   Body parts bathed by helper: Left lower leg, Right lower leg, Buttocks, Left arm     Bathing assist Assist Level: Moderate Assistance - Patient 50 - 74%     Upper Body Dressing/Undressing Upper body dressing   What is the patient wearing?: Pull over shirt    Upper body assist Assist Level: Moderate Assistance - Patient 50 - 74%    Lower Body Dressing/Undressing Lower body dressing      What is the patient wearing?: Pants     Lower body assist Assist for lower body dressing: Maximal Assistance - Patient 25 - 49%     Toileting Toileting    Toileting assist Assist for toileting: Dependent - Patient 0%     Transfers Chair/bed transfer  Transfers assist     Chair/bed transfer assist level: Maximal Assistance - Patient 25 - 49%  Locomotion Ambulation   Ambulation assist      Assist level: 2 helpers Assistive device: Other (comment) (Wall Rail) Max distance: 12'   Walk 10 feet activity   Assist     Assist level: 2 helpers Assistive device:  (Wall Rail)   Walk 50 feet activity   Assist Walk 50 feet with 2 turns activity did not occur: Safety/medical concerns         Walk 150 feet activity   Assist Walk 150 feet activity did not occur: Safety/medical concerns         Walk 10 feet on uneven surface  activity   Assist Walk 10 feet on  uneven surfaces activity did not occur: Safety/medical concerns         Wheelchair     Assist Will patient use wheelchair at discharge?: No             Wheelchair 50 feet with 2 turns activity    Assist            Wheelchair 150 feet activity     Assist          Blood pressure (!) 137/85, pulse 70, temperature 99.1 F (37.3 C), temperature source Oral, resp. rate 17, height 5\' 3"  (1.6 m), weight (!) 98.1 kg, SpO2 97 %.    Medical Problem List and Plan: 1. 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              -patient may shower             -ELOS/Goals: 14-18 days/Min A             Continue CIR 2.  Antithrombotics: -DVT/anticoagulation:  Pharmaceutical: Lovenox             -antiplatelet therapy: On ASA, Plavix to start today for 6 months.  3. Pain Management: N/A 4. Mood: LCSW to follow for evaluation and support.              -antipsychotic agents: N/A 5. Neuropsych: This patient maybe capable of making decisions on her own behalf. 6. Skin/Wound Care: Routine pressure relief measures.  7. Fluids/Electrolytes/Nutrition: Monitor I/Os. Electrolytes stable 8. HTN: Norvasc, Bystolic. Monitor BP tid. Well controlled             Monitor with increased mobility. 9. GERD: On Protonix.  10. Hyperlipidemia: Trig-1549-->330 11. Hypokalemia:              BMP normal on 7/29. No longer on supplement. Advised high potassium foods 12. Stress induced hyperglycemia: Hgb A1c-5.6.              Monitor with increased mobility 13: Neurogenic bladder: DC foley and continue bladder training. UA +, started Keflex. F/u UC  LOS: 2 days A FACE TO FACE EVALUATION WAS PERFORMED  Tanya Lopez Tanya Lopez 07/22/2020, 1:23 PM

## 2020-07-22 NOTE — Progress Notes (Signed)
Physical Therapy Session Note  Patient Details  Name: Tanya Lopez MRN: [id number] Date of Birth: [birth year]  Today's Date: 07/22/2020 PT Individual Time: 0802-0913 PT Individual Time Calculation (min): 71 min   Short Term Goals: Week 1:  PT Short Term Goal 1 (Week 1): Pt will perform bed mobility with modA PT Short Term Goal 2 (Week 1): Pt will perform bed to chair transfer with modA PT Short Term Goal 3 (Week 1): Pt will ambulate 67' with modA and LRAD  Skilled Therapeutic Interventions/Progress Updates: Pt presents supine in bed and agreeable to therapy.,  Pt has no c/o pain unless weight-bearing to RUE.  Pt required assist to thread RLE through pants and then able to place LLE in leg and attempt to pull up pants.  Pt able to bridge to pull up pants over left hip and w/ manual placement of RLE in hook-lying able to attempt pulling over Right hip.Pt rolled to right side w/ mod A and to left w/ mod , w/ assist for RUE.  Pt transferred left side-lying to sit w/  mod A.  Pt required mod A to scoot to EOB.  Pt required mod A for sit to stand and max A for SPT w/ blocking of RLE.Marland Kitchen  Pt negotiated w/c w L extremities x 30' w/ mod A to avoid obstacles to right.  Pt perfomed multiple sit to stand transfers in // bars w/ mod A and then performed weight-shift and stepping w/ LLE, blocking of right.  Pt performing w/ facilitation of RUE for weight-bearing.  Pt performed SPT w/ max to mod A and blocking of R knee w/c <> mat table.  Pt performed seated reaching and hooking horseshoes over basketball hoop as well as throwing horseshoes forward, reaching for same outside of BOS and crossing midline to challenge balance.  Pt returned to room and remained sitting in w/c w/ chair alarm on and all needs in reach.     Therapy Documentation Precautions:  Precautions Precautions: Fall Precaution Comments: R hemiplegia Restrictions Weight Bearing Restrictions: No General:   Vital Signs:  Pain:pt c/o  some pain in R elbow/shoulder w/ weight-bearing but unable to quantify.      Therapy/Group: Individual Therapy  Lucio Edward 07/22/2020, 10:15 AM

## 2020-07-23 ENCOUNTER — Inpatient Hospital Stay (HOSPITAL_COMMUNITY): Payer: BC Managed Care – PPO | Admitting: Physical Therapy

## 2020-07-23 ENCOUNTER — Inpatient Hospital Stay (HOSPITAL_COMMUNITY): Payer: BC Managed Care – PPO | Admitting: Occupational Therapy

## 2020-07-23 ENCOUNTER — Inpatient Hospital Stay (HOSPITAL_COMMUNITY): Payer: BC Managed Care – PPO | Admitting: Speech Pathology

## 2020-07-23 MED ORDER — SENNA 8.6 MG PO TABS
1.0000 | ORAL_TABLET | Freq: Every day | ORAL | Status: DC
  Administered 2020-07-24 – 2020-08-11 (×19): 8.6 mg via ORAL
  Filled 2020-07-23 (×19): qty 1

## 2020-07-23 NOTE — Progress Notes (Signed)
Occupational Therapy Session Note  Patient Details  Name: Tanya Lopez MRN: [id number] Date of Birth: March 11, 1971  Today's Date: 07/23/2020 OT Individual Time: 4098-1191 OT Individual Time Calculation (min): 54 min   Short Term Goals: Week 1:  OT Short Term Goal 1 (Week 1): Pt will complete toilet transfer with mod A of 1 OT Short Term Goal 2 (Week 1): Pt will maintain standing balance within bADL task with mod A OT Short Term Goal 3 (Week 1): Pt will complete 1 step of LB dressing task  Skilled Therapeutic Interventions/Progress Updates:    Pt greeted in bed with no c/o pain. Agreeable to shower. Mod-Max A for supine<sit and Min A to position Rt hand onto Stedy bar for sit<stand with CGA. Pt able to maintain grip on Stedy bar with affected hand during transfers today once her hand was placed. Mod A for bathing at shower level using small prop for feet to prevent dangling of LEs. Pt able to grip wash cloth with the Rt hand but needed assistance proximally to wash her unaffected side. Noted small bowel incontinence in shower with pt leaning towards her Rt side on TTB for OT to complete perihygiene. To make sure she was thoroughly clean, pericare also completed sit<stand from [facility] while semi perched near the sink afterwards. Then brief was donned with Total A. Pt transferred to the w/c and donned an overhead dress with assistance and vcs for hemi strategies. Sit<stand at sink completed with Min A and Rt knee blocked, Mod A for balance while she assisted with pulling dress down over hips on the Lt side due to active Rt knee buckling at this time. Noted Rt sided offloading during static standing at the sink as well. While seated, worked on [employer] during hand washing, lotion application, and oral care. Pt with developing grip strength in the Rt side, able to dispense toothpaste and lotion with vcs alone. Pt with active forearm pronation/supination and active assist elbow flexion against gravity and  elbow extension via gravity assisted. After pt combed her hair, she was positioned at beside and left in w/c with all needs within reach and safety belt fastened. Tx focus placed on Rt NMR, transfers, sit<stands, and ADL retraining.      Therapy Documentation Precautions:  Precautions Precautions: Fall Precaution Comments: R hemiplegia Restrictions Weight Bearing Restrictions: No ADL: ADL Eating: Minimal assistance Grooming: Minimal assistance Upper Body Bathing: Moderate assistance Lower Body Bathing: Maximal assistance Upper Body Dressing: Maximal assistance Lower Body Dressing: Maximal assistance Toileting: Maximal assistance Toilet Transfer: Maximal assistance      Therapy/Group: Individual Therapy  Loraine Bhullar A Cutler Sunday 07/23/2020, 12:24 PM

## 2020-07-23 NOTE — Progress Notes (Signed)
Speech Language Pathology Daily Session Note  Patient Details  Name: Tanya Lopez MRN: [id number] Date of Birth: 09/19/1971  Today's Date: 07/23/2020 SLP Individual Time: 1435-1530 SLP Individual Time Calculation (min): 55 min  Short Term Goals: Week 1: SLP Short Term Goal 1 (Week 1): Pt will express wants/needs at phrase level with min A multimodal cues. SLP Short Term Goal 2 (Week 1): Pt will increase verbal output to sentence level in structured tasks with min A semantic cues. SLP Short Term Goal 3 (Week 1): Pt will utilize word finding strategies in structured tasks with min A verbal cues. SLP Short Term Goal 4 (Week 1): Pt will return demonstration of 1 safety precaution during functional tasks with supervision cues.  Skilled Therapeutic Interventions:  Pt was seen for skilled ST targeting communication goals.  Upon arrival, pt was in bed, awake, alert, and agreeable to participating in therapy.  SLP introduced the idea of a "talking points" notebook to assist in initiation of functional communication as pt was noted to be able to convey herself at the phrase-sentence level but struggled with starting conversations (noted by therapist and reported by pt's husband during yesterday's treatment).  When asked what she did in therapy today in order to help therapist record information into notebook, pt was able to state "took a shower" for OT and "worked on walking" for PT with increased time.  With structured descriptive naming and open ended sentence completion tasks, pt was 100% accurate with mod I but continues to struggle during functional conversations surrounding more abstract concepts and requires up to mod assist to express ideas at the phrase level.  Pt was left in bed with bed alarm set and call bell within reach.  Continue per current plan of care.    Pain Pain Assessment Pain Scale: 0-10 Pain Score: 0-No pain  Therapy/Group: Individual Therapy  Abdulkareem Badolato, Melanee Spry 07/23/2020,  4:47 PM

## 2020-07-23 NOTE — Progress Notes (Signed)
Physical Therapy Session Note  Patient Details  Name: Tanya Lopez MRN: [id number] Date of Birth: September 21, 1971  Today's Date: 07/23/2020 PT Individual Time: 1110-1205 PT Individual Time Calculation (min): 55 min   Short Term Goals: Week 1:  PT Short Term Goal 1 (Week 1): Pt will perform bed mobility with modA PT Short Term Goal 2 (Week 1): Pt will perform bed to chair transfer with modA PT Short Term Goal 3 (Week 1): Pt will ambulate 60' with modA and LRAD  Skilled Therapeutic Interventions/Progress Updates:   Pt received sitting in WC and agreeable to PT. Pt transported to rehab gym. Sit<>stand from WCx 5 with mod assist overall from PT to facilitate knee extension on the R.   pregait stepping task to target and UE support on RW, 3 x 5 BLE with mod assist to improve movement and coordination of the RLE as well as DF wrap applied on 3rd bout.   Gait training with RW, DF wrap, and R hand splint x 23f and 245fwith min-mod assist from PT. Pt noted to have improved step length/hip flexion activaiton, weight shifting, and terminal knee extension on the RLE with increased distance. Cues for sequencing, safety, step height, weight shifting and AD management.   Sit<>supine with mod assist for BLE control and moderate cues for coordination of RUE/LE Supine NMR SAQ, hip flexion/extension in synergy, hip abduction all completed with AAROM with only trace activation noted in non functional movements. Bridges x 8 and PNF dynamic/stabilizing reversals 2 x 3 minute each with mod-max cues for improved awareness of the R side as well as external cues to improve neuromotor recruitment Stand pivot transfer with mod assist from PT for with DF wrap and RW to [place] to [place] for pelvic rotation and AD management.   [facility] to [facility] in reach and all needs met.         Therapy Documentation Precautions:  Precautions Precautions: Fall Precaution  Comments: R hemiplegia Restrictions Weight Bearing Restrictions: No Pain:   denies   Therapy/Group: Individual Therapy  AuLorie Phenix/31/2021, 12:14 PM

## 2020-07-23 NOTE — Progress Notes (Signed)
[place] PHYSICAL MEDICINE & REHABILITATION PROGRESS NOTE   Subjective/Complaints:  Pt reports voiding OK- LBM 2 days ago- wants to try  A "little something" to go more easily- feels constipated, but doesn't want a "clean out".  Slept OK  ROS: limited due to aphasia   Objective:   No results found. Recent Labs    07/21/20 0442  WBC 6.3  HGB 11.8*  HCT 38.9  PLT 363   Recent Labs    07/21/20 0442  NA 139  K 3.5  CL 104  CO2 25  GLUCOSE 107*  BUN 8  CREATININE 0.72  CALCIUM 9.0    Intake/Output Summary (Last 24 hours) at 07/23/2020 1543 Last data filed at 07/23/2020 0900 Gross per 24 hour  Intake 180 ml  Output --  Net 180 ml     Physical Exam: Vital Signs Blood pressure (!) 134/74, pulse 72, temperature 97.6 F (36.4 C), temperature source Oral, resp. rate 16, height 5\' 3"  (1.6 m), weight (!) 94.7 kg, SpO2 100 %. General: Alert, appropriate, sitting up in manual w/c, NAD HEENT: conjugate gaze  Neck: Supple without JVD or lymphadenopathy Heart: RRR Chest: CTA B/L- no W/R/R- good air movement Abdomen: soft, NT, slightly distended, hypoactive BS Extremities: No clubbing, cyanosis, or edema. Pulses are 2+ Skin: Clean and intact without signs of breakdown Neurological:     Mental Status: She is alert.     Comments: Global aphasia, ?expressive  > receptive still  Able to say 1-2 word responses/answers Does tends to nod yes to most questions.  Motor: Right hemiplegia Left upper extremity: 4 -/5 proximal distal  Left lower extremity: 4+/5 proximal to distal  Psychiatric:     Comments: calm  Assessment/Plan: 1. Functional deficits secondary to acute L ACA stroke which require 3+ hours per day of interdisciplinary therapy in a comprehensive inpatient rehab setting.  Physiatrist is providing close team supervision and 24 hour management of active medical problems listed below.  Physiatrist and rehab team continue to assess barriers to discharge/monitor  patient progress toward functional and medical goals  Care Tool:  Bathing    Body parts bathed by patient: Chest, Abdomen, Front perineal area, Right upper leg, Left upper leg, Face   Body parts bathed by helper: Right arm, Left arm, Buttocks, Right lower leg, Left lower leg     Bathing assist Assist Level: Moderate Assistance - Patient 50 - 74%     Upper Body Dressing/Undressing Upper body dressing   What is the patient wearing?: Dress    Upper body assist Assist Level: Moderate Assistance - Patient 50 - 74%    Lower Body Dressing/Undressing Lower body dressing      What is the patient wearing?: Incontinence brief     Lower body assist Assist for lower body dressing: Total Assistance - Patient < 25%     Toileting Toileting    Toileting assist Assist for toileting: Moderate Assistance - Patient 50 - 74%     Transfers Chair/bed transfer  Transfers assist     Chair/bed transfer assist level: Maximal Assistance - Patient 25 - 49%     Locomotion Ambulation   Ambulation assist      Assist level: Moderate Assistance - Patient 50 - 74% Assistive device: Hand held assist Max distance:  (stand pivot to bSC)   Walk 10 feet activity   Assist     Assist level: 2 helpers Assistive device:  (Wall Rail)   Walk 50 feet activity   Assist Walk  50 feet with 2 turns activity did not occur: Safety/medical concerns         Walk 150 feet activity   Assist Walk 150 feet activity did not occur: Safety/medical concerns         Walk 10 feet on uneven surface  activity   Assist Walk 10 feet on uneven surfaces activity did not occur: Safety/medical concerns         Wheelchair     Assist Will patient use wheelchair at discharge?: No             Wheelchair 50 feet with 2 turns activity    Assist            Wheelchair 150 feet activity     Assist          Blood pressure (!) 134/74, pulse 72, temperature 97.6 F (36.4  C), temperature source Oral, resp. rate 16, height 5\' 3"  (1.6 m), weight (!) 94.7 kg, SpO2 100 %.    Medical Problem List and Plan: 1. 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              -patient may shower             -ELOS/Goals: 14-18 days/Min A             Continue CIR 2.  Antithrombotics: -DVT/anticoagulation:  Pharmaceutical: Lovenox             -antiplatelet therapy: On ASA, Plavix to start today for 6 months.  3. Pain Management: N/A 4. Mood: LCSW to follow for evaluation and support.              -antipsychotic agents: N/A 5. Neuropsych: This patient maybe capable of making decisions on her own behalf. 6. Skin/Wound Care: Routine pressure relief measures.  7. Fluids/Electrolytes/Nutrition: Monitor I/Os. Electrolytes stable 8. HTN: Norvasc, Bystolic. Monitor BP tid. Well controlled  7/31- BP 130s/70s- con't regimen             Monitor with increased mobility. 9. GERD: On Protonix.  10. Hyperlipidemia: Trig-1549-->330 11. Hypokalemia:              BMP normal on 7/29. No longer on supplement. Advised high potassium foods  7/31- last K+ 3.5- so low normal- con't bananas, etc 12. Stress induced hyperglycemia: Hgb A1c-5.6.              Monitor with increased mobility 13: Neurogenic bladder: DC foley and continue bladder training. UA +, started Keflex. F/u UC 14. Constipation  7/31- LBM 2 days ago- will add Senokot 1 tab daily.    LOS: 3 days A FACE TO FACE EVALUATION WAS PERFORMED  Tawna Alwin 07/23/2020, 3:43 PM

## 2020-07-24 LAB — URINE CULTURE: Culture: >=100000 — AB

## 2020-07-24 MED ORDER — SULFAMETHOXAZOLE-TRIMETHOPRIM 800-160 MG PO TABS
1.0000 | ORAL_TABLET | Freq: Two times a day (BID) | ORAL | Status: AC
  Administered 2020-07-24 – 2020-07-29 (×10): 1 via ORAL
  Filled 2020-07-24 (×10): qty 1

## 2020-07-24 NOTE — Plan of Care (Signed)
  Problem: RH BOWEL ELIMINATION Goal: RH STG MANAGE BOWEL WITH ASSISTANCE Description: STG Manage Bowel with min Assistance. Outcome: Progressing Goal: RH STG MANAGE BOWEL W/MEDICATION W/ASSISTANCE Description: STG Manage Bowel with Medication with min Assistance. Outcome: Progressing   Problem: RH SKIN INTEGRITY Goal: RH STG SKIN FREE OF INFECTION/BREAKDOWN Description: Skin to remain free from breakdown with min assist while on rehab. Outcome: Progressing

## 2020-07-24 NOTE — Progress Notes (Signed)
[place] PHYSICAL MEDICINE & REHABILITATION PROGRESS NOTE   Subjective/Complaints:  Pt reports doing OK- off therapy for day- denies pain- sleepy- napping. LBM this AM   ROS: limited due to aphasia   Objective:   No results found. No results for input(s): WBC, HGB, HCT, PLT in the last 72 hours. No results for input(s): NA, K, CL, CO2, GLUCOSE, BUN, CREATININE, CALCIUM in the last 72 hours.  Intake/Output Summary (Last 24 hours) at 07/24/2020 1541 Last data filed at 07/24/2020 0758 Gross per 24 hour  Intake 240 ml  Output --  Net 240 ml     Physical Exam: Vital Signs Blood pressure (!) 137/79, pulse 69, temperature 98 F (36.7 C), resp. rate 14, height 5\' 3"  (1.6 m), weight (!) 94.7 kg, SpO2 98 %. General: sleepy, laying on side in bed; appropriate, NAD HEENT: conjugate gaze  Neck: Supple without JVD or lymphadenopathy Heart: RRR Chest: CTA B/L- no W/R/R- good air movement Abdomen: soft, NT, ND, (+)BS Extremities: No clubbing, cyanosis, or edema. Pulses are 2+ Skin: Clean and intact without signs of breakdown Neurological:     Mental Status: She is alert.     Comments: Global aphasia, ?expressive  > receptive still  Able to say 1-2 word responses/answers Does tends to nod yes to most questions.  Motor: Right hemiplegia Left upper extremity: 4 -/5 proximal distal  Left lower extremity: 4+/5 proximal to distal  Psychiatric:     Comments: calm  Assessment/Plan: 1. Functional deficits secondary to acute L ACA stroke which require 3+ hours per day of interdisciplinary therapy in a comprehensive inpatient rehab setting.  Physiatrist is providing close team supervision and 24 hour management of active medical problems listed below.  Physiatrist and rehab team continue to assess barriers to discharge/monitor patient progress toward functional and medical goals  Care Tool:  Bathing    Body parts bathed by patient: Chest, Abdomen, Front perineal area, Right upper leg,  Left upper leg, Face   Body parts bathed by helper: Right arm, Left arm, Buttocks, Right lower leg, Left lower leg     Bathing assist Assist Level: Moderate Assistance - Patient 50 - 74%     Upper Body Dressing/Undressing Upper body dressing   What is the patient wearing?: Dress    Upper body assist Assist Level: Moderate Assistance - Patient 50 - 74%    Lower Body Dressing/Undressing Lower body dressing      What is the patient wearing?: Incontinence brief     Lower body assist Assist for lower body dressing: Minimal Assistance - Patient > 75%     Toileting Toileting    Toileting assist Assist for toileting: Maximal Assistance - Patient 25 - 49%     Transfers Chair/bed transfer  Transfers assist     Chair/bed transfer assist level: Maximal Assistance - Patient 25 - 49%     Locomotion Ambulation   Ambulation assist      Assist level: Moderate Assistance - Patient 50 - 74% Assistive device: Hand held assist Max distance:  (stand pivot to bSC)   Walk 10 feet activity   Assist     Assist level: 2 helpers Assistive device:  (Wall Rail)   Walk 50 feet activity   Assist Walk 50 feet with 2 turns activity did not occur: Safety/medical concerns         Walk 150 feet activity   Assist Walk 150 feet activity did not occur: Safety/medical concerns  Walk 10 feet on uneven surface  activity   Assist Walk 10 feet on uneven surfaces activity did not occur: Safety/medical concerns         Wheelchair     Assist Will patient use wheelchair at discharge?: No             Wheelchair 50 feet with 2 turns activity    Assist            Wheelchair 150 feet activity     Assist          Blood pressure (!) 137/79, pulse 69, temperature 98 F (36.7 C), resp. rate 14, height 5\' 3"  (1.6 m), weight (!) 94.7 kg, SpO2 98 %.    Medical Problem List and Plan: 1. Globally, expressive > receptive aphasia with minimal  verbal output and right flaccid hemiparesis affecting mobility and ADLs secondary to left > right brain infarcts.              -patient may shower             -ELOS/Goals: 14-18 days/Min A             Continue CIR 2.  Antithrombotics: -DVT/anticoagulation:  Pharmaceutical: Lovenox             -antiplatelet therapy: On ASA, Plavix to start today for 6 months.  3. Pain Management: N/A 4. Mood: LCSW to follow for evaluation and support.              -antipsychotic agents: N/A 5. Neuropsych: This patient maybe capable of making decisions on her own behalf. 6. Skin/Wound Care: Routine pressure relief measures.  7. Fluids/Electrolytes/Nutrition: Monitor I/Os. Electrolytes stable 8. HTN: Norvasc, Bystolic. Monitor BP tid. Well controlled  7/31- BP 130s/70s- con't regimen  8/1- BP 137/79- con't regimen             Monitor with increased mobility. 9. GERD: On Protonix.  10. Hyperlipidemia: Trig-1549-->330 11. Hypokalemia:              BMP normal on 7/29. No longer on supplement. Advised high potassium foods  7/31- last K+ 3.5- so low normal- con't bananas, etc 12. Stress induced hyperglycemia: Hgb A1c-5.6.              Monitor with increased mobility 13: Neurogenic bladder: DC foley and continue bladder training. UA +, started Keflex. F/u UC  8/1- likely resistant to Keflex based on U Cx- - has >100k E Coli- changed to Bactrim DS BID x 5 days 14. Constipation  7/31- LBM 2 days ago- will add Senokot 1 tab daily.   8/1- LBM this AM- no blow outs   LOS: 4 days A FACE TO FACE EVALUATION WAS PERFORMED  Miho Monda 07/24/2020, 3:41 PM

## 2020-07-25 ENCOUNTER — Inpatient Hospital Stay (HOSPITAL_COMMUNITY): Payer: BC Managed Care – PPO

## 2020-07-25 ENCOUNTER — Inpatient Hospital Stay (HOSPITAL_COMMUNITY): Payer: BC Managed Care – PPO | Admitting: Occupational Therapy

## 2020-07-25 ENCOUNTER — Inpatient Hospital Stay (HOSPITAL_COMMUNITY): Payer: BC Managed Care – PPO | Admitting: Speech Pathology

## 2020-07-25 LAB — CBC
HCT: 38.3 % (ref 36.0–46.0)
Hemoglobin: 11.9 g/dL — ABNORMAL LOW (ref 12.0–15.0)
MCH: 25.2 pg — ABNORMAL LOW (ref 26.0–34.0)
MCHC: 31.1 g/dL (ref 30.0–36.0)
MCV: 81 fL (ref 80.0–100.0)
Platelets: 347 10*3/uL (ref 150–400)
RBC: 4.73 MIL/uL (ref 3.87–5.11)
RDW: 20 % — ABNORMAL HIGH (ref 11.5–15.5)
WBC: 6.4 10*3/uL (ref 4.0–10.5)
nRBC: 0 % (ref 0.0–0.2)

## 2020-07-25 LAB — BASIC METABOLIC PANEL
Anion gap: 8 (ref 5–15)
BUN: 12 mg/dL (ref 6–20)
CO2: 23 mmol/L (ref 22–32)
Calcium: 9.1 mg/dL (ref 8.9–10.3)
Chloride: 106 mmol/L (ref 98–111)
Creatinine, Ser: 0.71 mg/dL (ref 0.44–1.00)
GFR calc Af Amer: >60 mL/min (ref >60)
GFR calc non Af Amer: >60 mL/min (ref >60)
Glucose, Bld: 109 mg/dL — ABNORMAL HIGH (ref 70–99)
Potassium: 3.6 mmol/L (ref 3.5–5.1)
Sodium: 137 mmol/L (ref 135–145)

## 2020-07-25 NOTE — Progress Notes (Signed)
Speech Language Pathology Daily Session Note  Patient Details  Name: HALEE GLYNN MRN: [id number] Date of Birth: 1971/06/10  Today's Date: 07/25/2020 SLP Individual Time: 0725-0810 SLP Individual Time Calculation (min): 45 min  Short Term Goals: Week 1: SLP Short Term Goal 1 (Week 1): Pt will express wants/needs at phrase level with min A multimodal cues. SLP Short Term Goal 2 (Week 1): Pt will increase verbal output to sentence level in structured tasks with min A semantic cues. SLP Short Term Goal 3 (Week 1): Pt will utilize word finding strategies in structured tasks with min A verbal cues. SLP Short Term Goal 4 (Week 1): Pt will return demonstration of 1 safety precaution during functional tasks with supervision cues.  Skilled Therapeutic Interventions: Skilled treatment session focused on communication goals. SLP facilitated session by providing extra time and overall Min A verbal cues for word-finding during a mildly abstract language task of comparing/contrasting 2 similar items. Patient named the items with 100% accuracy. Patient left upright in bed with alarm on and all needs within reach. Continue with current plan of care.      Pain No/Denies Pain   Therapy/Group: Individual Therapy  Tregan Read 07/25/2020, 3:27 PM

## 2020-07-25 NOTE — Progress Notes (Signed)
PHYSICAL MEDICINE & REHABILITATION PROGRESS NOTE   Subjective/Complaints: Labs stable this morning. No complaints  ROS: limited due to aphasia   Objective:   No results found. Recent Labs    07/25/20 0614  WBC 6.4  HGB 11.9*  HCT 38.3  PLT 347   Recent Labs    07/25/20 0614  NA 137  K 3.6  CL 106  CO2 23  GLUCOSE 109*  BUN 12  CREATININE 0.71  CALCIUM 9.1    Intake/Output Summary (Last 24 hours) at 07/25/2020 1141 Last data filed at 07/25/2020 0826 Gross per 24 hour  Intake 402 ml  Output 500 ml  Net -98 ml     Physical Exam: Vital Signs Blood pressure 126/66, pulse 67, temperature 98.4 F (36.9 C), temperature source Oral, resp. rate 18, height 5\' 3"  (1.6 m), weight (!) 94.7 kg, SpO2 97 %. General: Alert and oriented x 3, No apparent distress HEENT: Head is normocephalic, atraumatic, PERRLA, EOMI, sclera anicteric, oral mucosa pink and moist, dentition intact, ext ear canals clear,  Neck: Supple without JVD or lymphadenopathy Heart: Reg rate and rhythm. No murmurs rubs or gallops Chest: CTA bilaterally without wheezes, rales, or rhonchi; no distress Abdomen: Soft, non-tender, non-distended, bowel sounds positive. Extremities: No clubbing, cyanosis, or edema. Pulses are 2+ Skin: Clean and intact without signs of breakdown Neurological:     Mental Status: She is alert.     Comments: Global aphasia, ?expressive  > receptive still  Able to say 1-2 word responses/answers Does tends to nod yes to most questions.  Motor: Right hemiplegia Left upper extremity: 4 -/5 proximal distal  Left lower extremity: 4+/5 proximal to distal  Psychiatric:     Comments: calm   Assessment/Plan: 1. Functional deficits secondary to acute L ACA stroke which require 3+ hours per day of interdisciplinary therapy in a comprehensive inpatient rehab setting.  Physiatrist is providing close team supervision and 24 hour management of active medical problems listed  below.  Physiatrist and rehab team continue to assess barriers to discharge/monitor patient progress toward functional and medical goals  Care Tool:  Bathing    Body parts bathed by patient: Chest, Abdomen, Front perineal area, Right upper leg, Left upper leg, Face   Body parts bathed by helper: Right arm, Left arm, Buttocks, Right lower leg, Left lower leg     Bathing assist Assist Level: Moderate Assistance - Patient 50 - 74%     Upper Body Dressing/Undressing Upper body dressing   What is the patient wearing?: Dress    Upper body assist Assist Level: Moderate Assistance - Patient 50 - 74%    Lower Body Dressing/Undressing Lower body dressing      What is the patient wearing?: Incontinence brief     Lower body assist Assist for lower body dressing: Minimal Assistance - Patient > 75%     Toileting Toileting    Toileting assist Assist for toileting: Maximal Assistance - Patient 25 - 49%     Transfers Chair/bed transfer  Transfers assist     Chair/bed transfer assist level: Maximal Assistance - Patient 25 - 49%     Locomotion Ambulation   Ambulation assist      Assist level: Moderate Assistance - Patient 50 - 74% Assistive device: Hand held assist Max distance:  (stand pivot to bSC)   Walk 10 feet activity   Assist     Assist level: 2 helpers Assistive device:  (Wall Rail)   Walk 50 feet activity  Assist Walk 50 feet with 2 turns activity did not occur: Safety/medical concerns         Walk 150 feet activity   Assist Walk 150 feet activity did not occur: Safety/medical concerns         Walk 10 feet on uneven surface  activity   Assist Walk 10 feet on uneven surfaces activity did not occur: Safety/medical concerns         Wheelchair     Assist Will patient use wheelchair at discharge?: No             Wheelchair 50 feet with 2 turns activity    Assist            Wheelchair 150 feet activity      Assist          Blood pressure 126/66, pulse 67, temperature 98.4 F (36.9 C), temperature source Oral, resp. rate 18, height 5\' 3"  (1.6 m), weight (!) 94.7 kg, SpO2 97 %.    Medical Problem List and Plan: 1. 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              -patient may shower             -ELOS/Goals: 14-18 days/Min A            Continue CIR 2.  Antithrombotics: -DVT/anticoagulation:  Pharmaceutical: Lovenox             -antiplatelet therapy: On ASA, Plavix to start today for 6 months.  3. Pain Management: N/A 4. Mood: LCSW to follow for evaluation and support.              -antipsychotic agents: N/A 5. Neuropsych: This patient maybe capable of making decisions on her own behalf. 6. Skin/Wound Care: Routine pressure relief measures.  7. Fluids/Electrolytes/Nutrition: Monitor I/Os. Electrolytes stable 8. HTN: Norvasc, Bystolic. Monitor BP tid.  8/2: BP well controlled             Monitor with increased mobility. 9. GERD: On Protonix.  10. Hyperlipidemia: Trig-1549-->330 11. Hypokalemia:              Stable on 8/2 12. Stress induced hyperglycemia: Hgb A1c-5.6.              Monitor with increased mobility 13: Neurogenic bladder: On Bactrim x5 days for UTI 14. Constipation: On senna  8/1- LBM this AM- no blow outs   LOS: 5 days A FACE TO FACE EVALUATION WAS PERFORMED  P Daymien Goth 07/25/2020, 11:41 AM

## 2020-07-25 NOTE — Progress Notes (Signed)
Occupational Therapy Session Note  Patient Details  Name: Tanya Lopez MRN: [id number] Date of Birth: 1971-09-05  Today's Date: 07/25/2020 OT Individual Time: 1300-1415 OT Individual Time Calculation (min): 75 min   Short Term Goals: Week 1:  OT Short Term Goal 1 (Week 1): Pt will complete toilet transfer with mod A of 1 OT Short Term Goal 2 (Week 1): Pt will maintain standing balance within bADL task with mod A OT Short Term Goal 3 (Week 1): Pt will complete 1 step of LB dressing task  Skilled Therapeutic Interventions/Progress Updates:    Pt greeted semi-reclined in bed with spouse present and agreeable to OT treatment session. Pt reported she wanted to shower today. Pt completed bed mobility with Mod A 2/2 R hemiplegia. Stedy used for transfer from [employer] over toilet> tub bench>wc. Pt needed verbal cues and guided A to bring R UE to grasp grab bar of Stedy. Pt's R UE function greatly improved since initial eval Thursday.Pt needed min A to come to standing in [place]. Pt attempted to urinate in commode, but was not able to go. Bathing completed from tub bench with focus on integrate R UE into bathing tasks for neuro re-ed. Hand over hand A to integrate R UE, but was then able to maintain shoulder position close to scalp while pt washed hair. Educated on hemi-dressing techniques with pt needing mod verbal cues to thread R UE first. Max A for LB dressing and mod A to stand without stedy to pull up pants +R knee block. Facilitated weight bearing through R LE perched on Stedy while pt blow dried her hair. Pt then brought down to therapy gym and R UE NMR using weight bearing towel pushes. Focus on shoulder flex/ext, adduction//abduction, forearm pronation/supination, wrist flex/ext. Pt returned to room and left seated in wc with alarm belt on, calll bell in reach, and needs met.   Therapy Documentation Precautions:  Precautions Precautions: Fall Precaution Comments: R  hemiplegia Restrictions Weight Bearing Restrictions: No Pain: Denies pain  Therapy/Group: Individual Therapy  Valma Cava 07/25/2020, 2:00 PM

## 2020-07-25 NOTE — Progress Notes (Signed)
Physical Therapy Session Note  Patient Details  Name: Tanya Lopez MRN: [id number] Date of Birth: 1971/09/05  Today's Date: 07/25/2020 PT Individual Time: 2409-7353 PT Individual Time Calculation (min): 70 min   Short Term Goals: Week 1:  PT Short Term Goal 1 (Week 1): Pt will perform bed mobility with modA PT Short Term Goal 2 (Week 1): Pt will perform bed to chair transfer with modA PT Short Term Goal 3 (Week 1): Pt will ambulate 22' with modA and LRAD  Skilled Therapeutic Interventions/Progress Updates:     Pt received supine and agreeable to therapy. At times during session reports R hip and R calf pain. Number not provided. PT provides rest breaks as needed to manage pain symptoms. Pt performs supine to sit with minA. Stand pivot transfer from bed to [facility] with modA. WC transport to therapy gym for time management. Pt performs targeted stepping with LLE to provide NMR and WB for RLE. Multiple sit to stands performed with min/modA. PT blocks RLE and pt steps toward numbered targets based on PT cues. Mirror provided for visual feedback. Pt requires modA for activity but is able to gradually improve weight shifting to the R to perform functional stepping with LLE. PT ace wrapts pt's RLE to facilitate dorsiflexion and eversion. Pt then ambulates 20' x2 with RW and hand splint. PT provides modA to block RLE during stance phase and manually progress RLE during swing phase, with pt only able to slightly assist with adductors for progression. Pt performs Nustep for reciprocal coordination training. PT manually facilitates proper BLE alignment to prevent external rotation of R hip during activity. Stand pivot from Nustep>WC>bed with modA and blocking R knee. Sit to supine with minA. Left supine in bed with alarm intact and all needs within reach.  Therapy Documentation Precautions:  Precautions Precautions: Fall Precaution Comments: R hemiplegia Restrictions Weight Bearing Restrictions:  No    Therapy/Group: Individual Therapy  Beau Fanny 07/25/2020, 4:08 PM

## 2020-07-26 ENCOUNTER — Inpatient Hospital Stay (HOSPITAL_COMMUNITY): Payer: BC Managed Care – PPO | Admitting: Occupational Therapy

## 2020-07-26 ENCOUNTER — Inpatient Hospital Stay (HOSPITAL_COMMUNITY): Payer: BC Managed Care – PPO

## 2020-07-26 ENCOUNTER — Inpatient Hospital Stay (HOSPITAL_COMMUNITY): Payer: BC Managed Care – PPO | Admitting: Speech Pathology

## 2020-07-26 ENCOUNTER — Inpatient Hospital Stay (HOSPITAL_COMMUNITY): Payer: BC Managed Care – PPO | Admitting: Physical Therapy

## 2020-07-26 DIAGNOSIS — I10 Essential (primary) hypertension: Secondary | ICD-10-CM

## 2020-07-26 DIAGNOSIS — E876 Hypokalemia: Secondary | ICD-10-CM

## 2020-07-26 DIAGNOSIS — R4701 Aphasia: Secondary | ICD-10-CM

## 2020-07-26 NOTE — Progress Notes (Signed)
Physical Therapy Session Note  Patient Details  Name: Tanya Lopez MRN: [id number] Date of Birth: 10-28-1971  Today's Date: 07/26/2020 PT Individual Time: 1502-1530 PT Individual Time Calculation (min): 28 min   Short Term Goals: Week 1:  PT Short Term Goal 1 (Week 1): Pt will perform bed mobility with modA PT Short Term Goal 2 (Week 1): Pt will perform bed to chair transfer with modA PT Short Term Goal 3 (Week 1): Pt will ambulate 71' with modA and LRAD  Skilled Therapeutic Interventions/Progress Updates:     Pt received supine in bed and agreeable to therapy. Reports no pain. Supine to sit with minA management of RLE. Stand pivot transfer from bed to [facility] with modA. WC transport to gym for time management. SPT to mat with min/modA. Sit to supine with minA. Pt rolls to prone with minA and PT then provides maxA to assist pt into tall kneeling position on plinth with platform placed in front for BLE. Mirror positioned for visual feedback. Pt is able to maintain tall kneeling position with CGA and does not demonstrate RLE or RUE buckling. Pt performs overhead reaching with LUE to facilitate core contraction and WB through RUE. Pt then performs AAROM RUE arm raises. Pt performs tall kneeling to sitting on knees and presses back up into tall kneeling x10 reps with CGA. Pt returns to supine with modA and sit to supine with minA. Mat>WC>bed with modA and SPTs, Pt left supine in bed with alarm intact and all needs within reach.  Therapy Documentation Precautions:  Precautions Precautions: Fall Precaution Comments: R hemiplegia Restrictions Weight Bearing Restrictions: No  Therapy/Group: Individual Therapy  Beau Fanny, PT, DPT 07/26/2020, 3:33 PM

## 2020-07-26 NOTE — Progress Notes (Signed)
Occupational Therapy Session Note  Patient Details  Name: Tanya Lopez MRN: [id number] Date of Birth: 12/09/1971  Today's Date: 07/26/2020 OT Individual Time: 6773-7366 OT Individual Time Calculation (min): 57 min   Short Term Goals: Week 1:  OT Short Term Goal 1 (Week 1): Pt will complete toilet transfer with mod A of 1 OT Short Term Goal 2 (Week 1): Pt will maintain standing balance within bADL task with mod A OT Short Term Goal 3 (Week 1): Pt will complete 1 step of LB dressing task  Skilled Therapeutic Interventions/Progress Updates:    Pt greeted semi-reclined in bed awake and agreeable to OT treatment session. Pt completed bed mobility with mod A to advance R LE to EOB and min A to elevate trunk. Worked on LB dressing to don underwear at EOB. PT needed OT assist to bring R LE into figure 4 position, then she was able to thread pant leg. Pt then could through LLE, but needed min A from OT for trunk support when reaching forward. Pt completed sit<>stand from EOB with mod A and R knee block. She then pivoted over to wc on stronger L side with R knee block and min A. Pt brought down to therapy gym for UB NMR. OT placed R UE on UE ergometer using Ace Wrap. Pt able to complete 2 sets of 5 minute intervals forwards and backwards. Graded clothes pin task with pt able to pinch yellow, red, and green clothes pins. Also focused on reaching with R UE. Pt brought to [employer] and worked on R hand finger isolation and reaching across body to hit light buttons. Pt returned to room at end of session and left seated in wc with chair alarm on, call bell in reach and needs met.   Therapy Documentation Precautions:  Precautions Precautions: Fall Precaution Comments: R hemiplegia Restrictions Weight Bearing Restrictions: No Pain: Pain Assessment Pain Scale: Faces Pain Score: 4  Faces Pain Scale: Hurts little more Pain Location:  (unable to locate)   Therapy/Group: Individual  Therapy  Valma Cava 07/26/2020, 9:00 AM

## 2020-07-26 NOTE — Progress Notes (Signed)
Speech Language Pathology Daily Session Note  Patient Details  Name: Tanya Lopez MRN: [id number] Date of Birth: April 07, 1971  Today's Date: 07/26/2020 SLP Individual Time: 1330-1430 SLP Individual Time Calculation (min): 60 min  Short Term Goals: Week 1: SLP Short Term Goal 1 (Week 1): Pt will express wants/needs at phrase level with min A multimodal cues. SLP Short Term Goal 2 (Week 1): Pt will increase verbal output to sentence level in structured tasks with min A semantic cues. SLP Short Term Goal 3 (Week 1): Pt will utilize word finding strategies in structured tasks with min A verbal cues. SLP Short Term Goal 4 (Week 1): Pt will return demonstration of 1 safety precaution during functional tasks with supervision cues.  Skilled Therapeutic Interventions:   Patient named object pictures and verb/action pictures with 90% accuracy and without cues. She expanded to phrase and sentence level describing with clinician providing verbal modeling and question cues, "What is he eating?" when she would respond, "eating". Patient able to describe differences between two pictures as well as describe object photos with frequent cues to expand into phrases from 1-2 word utterances. Patient appears aware of majority if not all of her language errors and does exhibit self-correcting or at least attempts to do so. She wrote name and address but was not completely legible and there were a few instances of missing letters or doubling of letters. Street number and street address correct as verified in patient's chart. Continue with ST tx with focus on phrase level describing, consider testing expressive writing.  Pain Pain Assessment Pain Scale: 0-10 Pain Score: 0-No pain  Therapy/Group: Individual Therapy  Angela Nevin, MA, CCC-SLP Speech Therapy

## 2020-07-26 NOTE — Patient Care Conference (Signed)
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 Faith Rogue Care Coodinator Present: Cecile Sheerer, LCSWA;Keagan Brislin Marlyne Beards, RN, BSN, CRRN Nurse Present: Otilio Carpen, RN PT Present: Aleda Grana, PT OT Present: Kearney Hard, OT SLP Present: Feliberto Gottron, SLP PPS Coordinator present : Edson Snowball, Park Breed, SLP     Current Status/Progress Goal Weekly Team Focus  Bowel/Bladder   pt cont of bowel and bladder. lbm 07/25/20  Remain cont of bowel and bladder  time toilette q 4 hour bladder scan q4-6   Swallow/Nutrition/ Hydration             ADL's   mod A overall  Supervision/CGA  R UE NMR, transfers, self-care retraining, actviity tolerance, balance training   Mobility   min assist bed mobility & transfers, mod assist gait x 20 ft  supervision<>Min assist overall  gait, transfers, bed mobility, strength, endurance, pt education   Communication   Min-Mod A  Supervision  verbal expression at phrase and sentence level, word-finding   Safety/Cognition/ Behavioral Observations  Min A  Supervision  safety awareness   Pain   pt c/o of pain unable to tell pain location 5 on faces scale  decrease pain   assess pain and medicate q shift and prn    Skin   no current skin  break down   remain free from skin breakdown   assess skin q shift and prn      Team Discussion:  Discharge Planning/Teaching Needs:  Patient to discharge home with spouse.  Family education as recommended by therapy.   Current Update: None  Current Barriers to Discharge:  Incontinence  Possible Resolutions to Barriers: Patient has episodes of incontinence, timed toileting q 2 hrs.  Patient on target to meet rehab goals: yes, use faces pain scale and help patient better understand location of pain. Goal of supervision, currently min/mod with RW. SLP continues to work on verbal expression, adding in functional words. Progressing towards discharge goals.   *See Care Plan and progress notes for long and short-term goals.   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 improving verbal output, Weekly Focus/Goal: bp control, rx uti, improve bladder emptying.  Barriers to Discharge: Medical stability   Possible Resolutions to Barriers: daily med mgt, improve bp control, rx uti   Continued Need for Acute Rehabilitation Level of Care: The patient requires daily medical management by a physician with specialized training in physical medicine and rehabilitation for the following reasons: Direction of a multidisciplinary physical rehabilitation program to maximize functional independence : Yes Medical management of patient stability for increased activity during participation in an intensive rehabilitation regime.: Yes Analysis of laboratory values and/or radiology reports  with any subsequent need for medication adjustment and/or medical intervention. : Yes   I attest that I was present, lead the team conference, and concur with the assessment and plan of the team.   Tennis Must 07/26/2020, 3:34 PM

## 2020-07-26 NOTE — Progress Notes (Signed)
Patient ID: Tanya Lopez, female   DOB: January 29, 1971, 49 y.o.   MRN: [id number]  SW met with pt and pt husband to provide updates from team conference and d/c date 8/19. Fam edu on Friday 8/13 9am-12pm; and Tuesday 8/17 1pm-3pm.   Loralee Pacas, MSW, [place] Office: [phone] Cell: [phone] Fax: [phone]

## 2020-07-26 NOTE — Progress Notes (Signed)
Physical Therapy Session Note  Patient Details  Name: Tanya Lopez MRN: [id number] Date of Birth: 30-Jul-1971  Today's Date: 07/26/2020 PT Individual Time: 1104-1200 PT Individual Time Calculation (min): 56 min   Short Term Goals: Week 1:  PT Short Term Goal 1 (Week 1): Pt will perform bed mobility with modA PT Short Term Goal 2 (Week 1): Pt will perform bed to chair transfer with modA PT Short Term Goal 3 (Week 1): Pt will ambulate 48' with modA and LRAD  Skilled Therapeutic Interventions/Progress Updates:  Pt received in w/c & agreeable to tx. PT ace wraps RLE for dorsiflexion assist. Sit<>stand with RW & R hand orthosis with cuing to push up from w/c with min assist. Gait x 20 ft with RW & min assist with PT assisting with RLE advancement (pt able to initiate step) with pt demonstrating no R hip/knee flexion during swing phase but no buckling noted. Stand pivot w/c<>kinetron with min assist with assistance for RLE advancement and cuing for technique. Pt utilized kinetron in sitting with some hamstring activation noted with task focusing on BLE strengthening, RLE NMR. Sit<>stand with min assist at dynavision & pt engaged in dynavision without BUE support and min assist for balance with task focusing on standing balance & endurance. At end of session pt returned to room & contacted pt's husband via telephone & requested he bring in tennis shoes to trial AFO. Pt requires max assist to recall events from session & PT completed memory notebook entry. Pt left in w/c with chair alarm donned, call bell in reach.   Therapy Documentation Precautions:  Precautions Precautions: Fall Precaution Comments: R hemiplegia Restrictions Weight Bearing Restrictions: No  Pain: Pt denies c/o pain.   Therapy/Group: Individual Therapy  Sandi Mariscal 07/26/2020, 12:18 PM

## 2020-07-26 NOTE — Progress Notes (Signed)
[place] PHYSICAL MEDICINE & REHABILITATION PROGRESS NOTE   Subjective/Complaints: Up with OT in gym. No new complaints.   ROS: limited due to language/communication   Objective:   No results found. Recent Labs    07/25/20 0614  WBC 6.4  HGB 11.9*  HCT 38.3  PLT 347   Recent Labs    07/25/20 0614  NA 137  K 3.6  CL 106  CO2 23  GLUCOSE 109*  BUN 12  CREATININE 0.71  CALCIUM 9.1    Intake/Output Summary (Last 24 hours) at 07/26/2020 1238 Last data filed at 07/26/2020 0811 Gross per 24 hour  Intake 367 ml  Output 1 ml  Net 366 ml     Physical Exam: Vital Signs Blood pressure 123/73, pulse 68, temperature (!) 97.4 F (36.3 C), temperature source Oral, resp. rate 14, height 5\' 3"  (1.6 m), weight (!) 94.7 kg, SpO2 99 %. Constitutional: No distress . Vital signs reviewed. HEENT: EOMI, oral membranes moist Neck: supple Cardiovascular: RRR without murmur. No JVD    Respiratory/Chest: CTA Bilaterally without wheezes or rales. Normal effort    GI/Abdomen: BS +, non-tender, non-distended Ext: no clubbing, cyanosis, or edema Psych:flat Skin: Clean and intact without signs of breakdown Neurological:     Mental Status: She is alert.     Comments: word finding deficits with apraxia. ?mild right inattention. Answers basic questions and follows commands with delay/some confusion at times.  Motor: Right hemiplegia RUE 2-3/5. RLE tr/5 Left upper extremity: 4 -/5 proximal distal  Left lower extremity: 4+/5 proximal to distal      Assessment/Plan: 1. Functional deficits secondary to acute L ACA stroke which require 3+ hours per day of interdisciplinary therapy in a comprehensive inpatient rehab setting.  Physiatrist is providing close team supervision and 24 hour management of active medical problems listed below.  Physiatrist and rehab team continue to assess barriers to discharge/monitor patient progress toward functional and medical goals  Care Tool:  Bathing     Body parts bathed by patient: Chest, Abdomen, Front perineal area, Right upper leg, Left upper leg, Face   Body parts bathed by helper: Right arm, Left arm, Buttocks, Right lower leg, Left lower leg     Bathing assist Assist Level: Moderate Assistance - Patient 50 - 74%     Upper Body Dressing/Undressing Upper body dressing   What is the patient wearing?: Dress    Upper body assist Assist Level: Moderate Assistance - Patient 50 - 74%    Lower Body Dressing/Undressing Lower body dressing      What is the patient wearing?: Incontinence brief     Lower body assist Assist for lower body dressing: Minimal Assistance - Patient > 75%     Toileting Toileting    Toileting assist Assist for toileting: Maximal Assistance - Patient 25 - 49%     Transfers Chair/bed transfer  Transfers assist     Chair/bed transfer assist level: Moderate Assistance - Patient 50 - 74%     Locomotion Ambulation   Ambulation assist      Assist level: Minimal Assistance - Patient > 75% Assistive device: Walker-rolling Max distance: 20'   Walk 10 feet activity   Assist     Assist level: Supervision/Verbal cueing Assistive device: Walker-rolling   Walk 50 feet activity   Assist Walk 50 feet with 2 turns activity did not occur: Safety/medical concerns         Walk 150 feet activity   Assist Walk 150 feet activity did  not occur: Safety/medical concerns         Walk 10 feet on uneven surface  activity   Assist Walk 10 feet on uneven surfaces activity did not occur: Safety/medical concerns         Wheelchair     Assist Will patient use wheelchair at discharge?: No             Wheelchair 50 feet with 2 turns activity    Assist            Wheelchair 150 feet activity     Assist          Blood pressure 123/73, pulse 68, temperature (!) 97.4 F (36.3 C), temperature source Oral, resp. rate 14, height 5\' 3"  (1.6 m), weight (!) 94.7 kg,  SpO2 99 %.    Medical Problem List and Plan: 1. 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              -patient may shower             -ELOS/Goals:  8/19/Min A            Continue CIR 2.  Antithrombotics: -DVT/anticoagulation:  Pharmaceutical: Lovenox             -antiplatelet therapy: On ASA, Plavix started for 6 months.  3. Pain Management: N/A 4. Mood: LCSW to follow for evaluation and support.              -antipsychotic agents: N/A 5. Neuropsych: This patient maybe capable of making decisions on her own behalf. 6. Skin/Wound Care: Routine pressure relief measures.  7. Fluids/Electrolytes/Nutrition: Monitor I/Os. Electrolytes stable 8. HTN: Norvasc, Bystolic. Monitor BP tid.  8/3: BP well controlled             Monitor with increased mobility. 9. GERD: On Protonix.  10. Hyperlipidemia: Trig-1549-->330 11. Hypokalemia:              Stable on 8/2 12. Stress induced hyperglycemia: Hgb A1c-5.6.              Monitor with increased mobility 13: Neurogenic bladder: On Bactrim x5 days for UTI 14. Constipation: On senna  8/2 bm   LOS: 6 days A FACE TO FACE EVALUATION WAS PERFORMED  02-19-2002 07/26/2020, 12:38 PM

## 2020-07-26 NOTE — Progress Notes (Signed)
1650, Noted moderate amount of blood in toilet after pt pt used bathroom. Pt states last menstrual cycle was about 3 weeks ago. Placed peri pad to monitor for further bleeding. Pam PA made aware. Ordered to monitor for now. 1850 No bleeding noted on pad within 2 hrs. Applied new peri pad. Continue plan of care.  Marylu Lund, RN

## 2020-07-27 ENCOUNTER — Inpatient Hospital Stay (HOSPITAL_COMMUNITY): Payer: BC Managed Care – PPO

## 2020-07-27 ENCOUNTER — Inpatient Hospital Stay (HOSPITAL_COMMUNITY): Payer: BC Managed Care – PPO | Admitting: Speech Pathology

## 2020-07-27 ENCOUNTER — Inpatient Hospital Stay (HOSPITAL_COMMUNITY): Payer: BC Managed Care – PPO | Admitting: Physical Therapy

## 2020-07-27 NOTE — Progress Notes (Signed)
Physical Therapy Session Note  Patient Details  Name: Tanya Lopez MRN: [id number] Date of Birth: 1971/08/16  Today's Date: 07/27/2020 PT Individual Time: 0737-1062 PT Individual Time Calculation (min): 69 min   Short Term Goals: Week 2:  PT Short Term Goal 1 (Week 2): Pt will ambulate 50 ft with LRAD & min assist. PT Short Term Goal 2 (Week 2): Pt will complete car transfer with LRAD & min assist. PT Short Term Goal 3 (Week 2): Pt will negotiate 4 steps with mod assist.  Skilled Therapeutic Interventions/Progress Updates:  Pt received in bathroom with NT assisting her. Pt positioned at sink in stedy & performed hand hygiene with CGA for standing balance in stedy & cuing to attend to RUE. Pt assisted to w/c with stedy & PT donned shorts with max cuing for compensatory strategy & min assist for standing balance without UE support with pt able to assist with pulling pants over hips. PT donned B socks & shoes total assist for time management. In gym, PT dons R PLS AFO total assist. Sit<>stand with min assist with PT Providing max cuing for safe hand placement and managing RUE on orthosis with max cuing. Gait x 25 ft with RW & min assist with pt able to advance RLE with assistance for full step but pt with decreased stance time on RLE. PT dons R shoe cover to simulate toe cap & pt ambulates 30 ft with very slight increased ease of RLE foot advancement but fatigue during gait. Pt reports pain on bottom of R foot 2/2 AFO so PT doffs it & no redness/irritation noted to R foot. At rail in hallway, pt engages in kicking yoga block while ambulating with rail x 15 ft + 15 ft with task focusing on R hip flexion activation during gait. Pt completes stand pivot w/c<>mat table with min assist. Sit>L sidelying with min assist for RLE & cuing to hook RLE with LLE to assist it onto mat table. Positioned pt in L sidelying with powder board and focused on RLE hip flexion in gravity eliminated position with pt able  to demonstrate with tactile/verbal cuing and education & tactile cuing to limit compensatory movement with trunk rotation; pt is able to activate hip flexors in this position. Pt transfers L sidelying>sitting with min assist and stand pivot to w/c with min assist. At end of session pt left in w/c with chair alarm donned, call bell in reach & PT completed memory notebook entry.  Therapy Documentation Precautions:  Precautions Precautions: Fall Precaution Comments: R hemiplegia Restrictions Weight Bearing Restrictions: No     Therapy/Group: Individual Therapy  Sandi Mariscal 07/27/2020, 3:58 PM

## 2020-07-27 NOTE — Progress Notes (Signed)
Physical Therapy Weekly Progress Note  Patient Details  Name: Tanya Lopez MRN: [id number] Date of Birth: September 09, 1971  Beginning of progress report period: July 21, 2020 End of progress report period: July 27, 2020  Today's Date: 07/27/2020   Patient has met 2 of 3 short term goals.  Pt is making steady progress towards LTG's. Pt currently requires min/mod assist for stand pivot transfers & min assist for short distance gait (<20 ft) with RW. Pt demonstrates impaired R hip & knee flexion strength & neuromuscular control that affects gait, so will trial AFO's. Pt also demonstrates decreased attention to R side of body. Pt would benefit from continued skilled PT treatment to address deficits noted above/below & to increase independence with functional mobility prior to d/c.   Patient continues to demonstrate the following deficits muscle weakness, decreased cardiorespiratoy endurance, decreased coordination, decreased attention to right, decreased awareness, decreased problem solving, decreased safety awareness and decreased memory, and decreased standing balance, decreased postural control, decreased balance strategies and R hemiparesis (LE>UE) and therefore will continue to benefit from skilled PT intervention to increase functional independence with mobility.  Patient progressing toward long term goals..  Continue plan of care.  PT Short Term Goals Week 1:  PT Short Term Goal 1 (Week 1): Pt will perform bed mobility with modA PT Short Term Goal 1 - Progress (Week 1): Met PT Short Term Goal 2 (Week 1): Pt will perform bed to chair transfer with modA PT Short Term Goal 2 - Progress (Week 1): Met PT Short Term Goal 3 (Week 1): Pt will ambulate 70' with modA and LRAD PT Short Term Goal 3 - Progress (Week 1): Progressing toward goal Week 2:  PT Short Term Goal 1 (Week 2): Pt will ambulate 50 ft with LRAD & min assist. PT Short Term Goal 2 (Week 2): Pt will complete car transfer with LRAD &  min assist. PT Short Term Goal 3 (Week 2): Pt will negotiate 4 steps with mod assist.   Therapy Documentation Precautions:  Precautions Precautions: Fall Precaution Comments: R hemiplegia Restrictions Weight Bearing Restrictions: No   Therapy/Group: Individual Therapy  Waunita Schooner 07/27/2020, 7:43 AM

## 2020-07-27 NOTE — Progress Notes (Signed)
Pt taken to radiology via bed

## 2020-07-27 NOTE — Progress Notes (Signed)
Post fall VS 

## 2020-07-27 NOTE — Progress Notes (Signed)
Patient had no blood on pad when taken to the bathroom. There was also no blood in the toilette and no blood on toilette paper when completing peri care. Will continue to monitor. Tacy Learn, LPN

## 2020-07-27 NOTE — Progress Notes (Signed)
Speech Language Pathology Weekly Progress and Session Note  Patient Details  Name: Tanya Lopez MRN: [id number] Date of Birth: 1971/01/27  Beginning of progress report period: July 20, 2020 End of progress report period: July 27, 2020  Today's Date: 07/27/2020 SLP Individual Time: 1035-1130 SLP Individual Time Calculation (min): 55 min  Short Term Goals: Week 1: SLP Short Term Goal 1 (Week 1): Pt will express wants/needs at phrase level with min A multimodal cues. SLP Short Term Goal 1 - Progress (Week 1): Met SLP Short Term Goal 2 (Week 1): Pt will increase verbal output to sentence level in structured tasks with min A semantic cues. SLP Short Term Goal 2 - Progress (Week 1): Met SLP Short Term Goal 3 (Week 1): Pt will utilize word finding strategies in structured tasks with min A verbal cues. SLP Short Term Goal 3 - Progress (Week 1): Met SLP Short Term Goal 4 (Week 1): Pt will return demonstration of 1 safety precaution during functional tasks with supervision cues. SLP Short Term Goal 4 - Progress (Week 1): Met    New Short Term Goals: Week 2: SLP Short Term Goal 1 (Week 2): Pt will express wants/needs at phrase level with supervision level multimodal cues. SLP Short Term Goal 2 (Week 2): Pt will increase verbal output to sentence level in structured tasks with supervision level semantic cues. SLP Short Term Goal 3 (Week 2): Pt will utilize word finding strategies in structured tasks with supervision level verbal cues.  Weekly Progress Updates: Patient has made excellent gains and has met 4 of 4 STGs this reporting period. Currently, patient requires overall Min A multimodal cues for expression of wants/needs at the phrase level, use of word-finding strategies and for expanding her length of utterance during structured tasks. Patient also demonstrates appropriate safety awareness with functional tasks. Patient and family education ongoing. Patient would benefit from continued  skilled SLP intervention to maximize her communication prior to discharge.      Intensity: Minumum of 1-2 x/day, 30 to 90 minutes Frequency: 3 to 5 out of 7 days Duration/Length of Stay: 08/11/20 Treatment/Interventions: Cueing hierarchy;Speech/Language facilitation;Internal/external aids;Therapeutic Activities;Environmental controls;Functional tasks;Patient/family education   Daily Session  Skilled Therapeutic Interventions: Skilled treatment session focused on communication goals. Upon arrival, patient was supine in bed and required Min A verbal cues for safety during the transfer to the wheelchair via the [place]. SLP facilitated session by providing overall Min A question cues for word-finding and expanding length of utterance during a structured verbal description task. Patient demonstrated emergent awareness of errors but required extra time and Min A verbal cues to correct. Patient transferred back to bed at end of session via the [facility]. Patient left supine in bed with alarm on and all needs within reach. Continue with current plan of care.      Pain No/Denies Pain   Therapy/Group: Individual Therapy  Curry Seefeldt 07/27/2020, 6:28 AM

## 2020-07-27 NOTE — Progress Notes (Signed)
Occupational Therapy Session Note  Patient Details  Name: Tanya Lopez MRN: [id number] Date of Birth: 12-Feb-1971  Today's Date: 07/27/2020 OT Individual Time: 1552-0802 OT Individual Time Calculation (min): 60 min   Session 2: OT Individual Time: 1300-1330 OT Individual Time Calculation (min): 30 min    Short Term Goals: Week 1:  OT Short Term Goal 1 (Week 1): Pt will complete toilet transfer with mod A of 1 OT Short Term Goal 2 (Week 1): Pt will maintain standing balance within bADL task with mod A OT Short Term Goal 3 (Week 1): Pt will complete 1 step of LB dressing task  Skilled Therapeutic Interventions/Progress Updates:    Pt received EOB, transferring via stedy to the toilet. Pt required cueing and mod facilitation for proper RLE placement on stedy step, with ankle inversion and poor proprioception limiting proper foot alignment. Pt able to complete sit> stand in stedy with no physical assist, cueing for R attention. Pt voided BM on toilet. She stood with the stedy and completed peri hygiene in standing with min A for thoroughness. Pt agreeable to take shower. She transferred onto TTB in walk in shower with stedy. Pt completed UB bathing with min A and mod cueing throughout for R UE use. Min A to wash hair. Pt completed LB bathing with lateral leans with CGA. Pt transferred to w/c and donned underwear sit <> stand with use of the sink for support with mod A. Pt required min cueing for hemi technique. Min A to don dress. Pt completed oral care at the sink with mod cueing for initiation. Pt was taken to the therapy gym via w/c. Pt stood with the RW and completed functional reaching activity with focus on RUE NMR, crossing midline and external rotation. Pt able to manipulate clothespins with min cueing for technique. Pt required manual facilitation for weight bearing over RLE, including physical blocking at the knee. Pt returned to her room and was left supine with all needs met. Bed  alarm set.   Session 2:  Pt received supine with no c/o pain, agreeable to session focused on BUE coordination and RUE NMR. Pt completed stand pivot transfer to the R side with min A, blocking required for RLE. Pt taken to therapy gym for time management. Pt completed pipe tree for BUE coordination, requiring frequent cueing for RUE use. Pt able to follow demonstration 100% accuracy and occasional min facilitation for RUE use. Pt then completed threading activity at midline for RUE [facility] training. Pt again required mod cueing for R UE neglect. Good FMC overall, pt limited by mostly neglect. Pt was returned to her room and encouraged to stay sitting up in the w/c. Chair alarm set.   Therapy Documentation Precautions:  Precautions Precautions: Fall Precaution Comments: R hemiplegia Restrictions Weight Bearing Restrictions: No  Therapy/Group: Individual Therapy  Curtis Sites 07/27/2020, 6:53 AM

## 2020-07-27 NOTE — Telephone Encounter (Signed)
Attempted to call patient to arrange PFO consult in 3-4 weeks.   VM is full- unable to leave message. Will try again later.

## 2020-07-27 NOTE — Progress Notes (Signed)
   07/27/20 1537  What Happened  Was fall witnessed? No  Was patient injured? No  Patient found on floor Tanya Lopez)  Found by Staff-comment  Stated prior activity to/from bed, chair, or stretcher  Follow Up  MD notified Delle Reining PA  Time MD notified 1540  Simple treatment Other (comment) (N/A no injury )  Progress note created (see row info) Yes  Adult Fall Risk Assessment  Risk Factor Category (scoring not indicated) Fall has occurred during this admission (document High fall risk)  Age 49  Fall History: Fall within 6 months prior to admission 0  Elimination; Bowel and/or Urine Incontinence 2  Elimination; Bowel and/or Urine Urgency/Frequency 0  Medications: includes PCA/Opiates, Anti-convulsants, Anti-hypertensives, Diuretics, Hypnotics, Laxatives, Sedatives, and Psychotropics 5  Patient Care Equipment 0  Mobility-Assistance 2  Mobility-Gait 2  Mobility-Sensory Deficit 0  Altered awareness of immediate physical environment 0  Impulsiveness 2  Lack of understanding of one's physical/cognitive limitations 4  Total Score 17  Patient Fall Risk Level High fall risk  Adult Fall Risk Interventions  Required Bundle Interventions *See Row Information* High fall risk - low, moderate, and high requirements implemented  Additional Interventions Camera surveillance (with patient/family notification & education);Lap belt while in chair/wheelchair;PT/OT need assessed if change in mobility from baseline;Use of appropriate toileting equipment (bedpan, BSC, etc.)  Screening for Fall Injury Risk (To be completed on HIGH fall risk patients) - Assessing Need for Low Bed  Risk For Fall Injury- Low Bed Criteria None identified - Continue screening  Screening for Fall Injury Risk (To be completed on HIGH fall risk patients who do not meet crieteria for Low Bed) - Assessing Need for Floor Mats Only  Risk For Fall Injury- Criteria for Floor Mats None identified - No additional interventions needed   Vitals  BP (!) 143/84  MAP (mmHg) 100  BP Location Right Arm  BP Method Automatic  Patient Position (if appropriate) Sitting  Pulse Rate 86  Pulse Rate Source Monitor  Resp 18  Oxygen Therapy  SpO2 100 %  O2 Device Room Air  Pain Assessment  Pain Scale 0-10  Pain Score 0  Neurological  Neuro (WDL) X  Level of Consciousness Alert  Orientation Level Oriented X4 (impulsive)  Cognition Appropriate at baseline  R Hand Grip Weak  L Hand Grip Moderate   R Foot Dorsiflexion Absent  L Foot Dorsiflexion Moderate  R Foot Plantar Flexion Weak  L Foot Plantar Flexion Moderate  Integumentary  Integumentary (WDL) WDL     Pt found next to bed on floor denies head injury pt at baseline no obvious injury back to bed via hoyer

## 2020-07-27 NOTE — Progress Notes (Signed)
Patient in bed denies any pain.

## 2020-07-27 NOTE — Progress Notes (Signed)
Patient with fall--was trying to get to the bathroom independently. Denies any pain while in bed. On exam, right trochanter/ lateral hip and right SI tender to palpation. Some discomfort with right hip--question MS in nature per Y/N discussion with patient. Will order ice for local measures as well as X rays to rule out fracture. Bed rest till cleared.

## 2020-07-27 NOTE — Progress Notes (Signed)
[facility] PHYSICAL MEDICINE & REHABILITATION PROGRESS NOTE   Subjective/Complaints: Slept well. No new complaints  ROS: limited due to language/communication   Objective:   No results found. Recent Labs    07/25/20 0614  WBC 6.4  HGB 11.9*  HCT 38.3  PLT 347   Recent Labs    07/25/20 0614  NA 137  K 3.6  CL 106  CO2 23  GLUCOSE 109*  BUN 12  CREATININE 0.71  CALCIUM 9.1    Intake/Output Summary (Last 24 hours) at 07/27/2020 0827 Last data filed at 07/26/2020 2225 Gross per 24 hour  Intake 200 ml  Output --  Net 200 ml     Physical Exam: Vital Signs Blood pressure 136/73, pulse 68, temperature 98 F (36.7 C), temperature source Oral, resp. rate 16, height 5\' 3"  (1.6 m), weight (!) 94.7 kg, SpO2 98 %. Constitutional: No distress . Vital signs reviewed. HEENT: EOMI, oral membranes moist Neck: supple Cardiovascular: RRR without murmur. No JVD    Respiratory/Chest: CTA Bilaterally without wheezes or rales. Normal effort    GI/Abdomen: BS +, non-tender, non-distended Ext: no clubbing, cyanosis, or edema Psych: pleasant but flat Skin: Clean and intact without signs of breakdown Neurological:     Mental Status: She is alert.     Comments: oriented x3. Word finding deficits improving but still apraxic. ?mild right inattention. Answers basic questions and follows commands with delay/some confusion at times.  Motor: Right hemiplegia RUE 2-3/5. RLE tr/5 Left upper extremity: 4 -/5 proximal distal  Left lower extremity: 4+/5 proximal to distal      Assessment/Plan: 1. Functional deficits secondary to acute L ACA stroke which require 3+ hours per day of interdisciplinary therapy in a comprehensive inpatient rehab setting.  Physiatrist is providing close team supervision and 24 hour management of active medical problems listed below.  Physiatrist and rehab team continue to assess barriers to discharge/monitor patient progress toward functional and medical  goals  Care Tool:  Bathing    Body parts bathed by patient: Chest, Abdomen, Front perineal area, Right upper leg, Left upper leg, Face   Body parts bathed by helper: Right arm, Left arm, Buttocks, Right lower leg, Left lower leg     Bathing assist Assist Level: Moderate Assistance - Patient 50 - 74%     Upper Body Dressing/Undressing Upper body dressing   What is the patient wearing?: Dress    Upper body assist Assist Level: Moderate Assistance - Patient 50 - 74%    Lower Body Dressing/Undressing Lower body dressing      What is the patient wearing?: Incontinence brief     Lower body assist Assist for lower body dressing: Minimal Assistance - Patient > 75%     Toileting Toileting    Toileting assist Assist for toileting: Maximal Assistance - Patient 25 - 49%     Transfers Chair/bed transfer  Transfers assist     Chair/bed transfer assist level: Moderate Assistance - Patient 50 - 74%     Locomotion Ambulation   Ambulation assist      Assist level: Minimal Assistance - Patient > 75% Assistive device: Walker-rolling Max distance: 20'   Walk 10 feet activity   Assist     Assist level: Supervision/Verbal cueing Assistive device: Walker-rolling   Walk 50 feet activity   Assist Walk 50 feet with 2 turns activity did not occur: Safety/medical concerns         Walk 150 feet activity   Assist Walk 150 feet activity  did not occur: Safety/medical concerns         Walk 10 feet on uneven surface  activity   Assist Walk 10 feet on uneven surfaces activity did not occur: Safety/medical concerns         Wheelchair     Assist Will patient use wheelchair at discharge?: No             Wheelchair 50 feet with 2 turns activity    Assist            Wheelchair 150 feet activity     Assist          Blood pressure 136/73, pulse 68, temperature 98 F (36.7 C), temperature source Oral, resp. rate 16, height 5\' 3"   (1.6 m), weight (!) 94.7 kg, SpO2 98 %.    Medical Problem List and Plan: 1. 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              -patient may shower             -ELOS/Goals:  8/19/Min A            Continue CIR 2.  Antithrombotics: -DVT/anticoagulation:  Pharmaceutical: Lovenox             -antiplatelet therapy: On ASA, Plavix started for 6 months.  3. Pain Management: N/A 4. Mood: LCSW to follow for evaluation and support.              -antipsychotic agents: N/A 5. Neuropsych: This patient maybe capable of making decisions on her own behalf. 6. Skin/Wound Care: Routine pressure relief measures.  7. Fluids/Electrolytes/Nutrition: Monitor I/Os. Electrolytes stable 8. HTN: Norvasc, Bystolic. Monitor BP tid.  8/4: BP well controlled             Monitor with increased mobility. 9. GERD: On Protonix.  10. Hyperlipidemia: Trig-1549-->330 11. Hypokalemia:              Stable on 8/2 12. Stress induced hyperglycemia: Hgb A1c-5.6.              Monitor with increased mobility 13: Neurogenic bladder: On Bactrim x5 days for UTI thru 8/5 14. Constipation: On senna  8/2 bm   LOS: 7 days A FACE TO FACE EVALUATION WAS PERFORMED  10/5 07/27/2020, 8:27 AM

## 2020-07-28 ENCOUNTER — Inpatient Hospital Stay (HOSPITAL_COMMUNITY): Payer: BC Managed Care – PPO | Admitting: Occupational Therapy

## 2020-07-28 ENCOUNTER — Inpatient Hospital Stay (HOSPITAL_COMMUNITY): Payer: BC Managed Care – PPO

## 2020-07-28 ENCOUNTER — Inpatient Hospital Stay (HOSPITAL_COMMUNITY): Payer: BC Managed Care – PPO | Admitting: Speech Pathology

## 2020-07-28 NOTE — Progress Notes (Signed)
Speech Language Pathology Daily Session Note  Patient Details  Name: Tanya Lopez MRN: [id number] Date of Birth: Apr 10, 1971  Today's Date: 07/28/2020 SLP Individual Time: 0715-0755 SLP Individual Time Calculation (min): 40 min  Short Term Goals: Week 2: SLP Short Term Goal 1 (Week 2): Pt will express wants/needs at phrase level with supervision level multimodal cues. SLP Short Term Goal 2 (Week 2): Pt will increase verbal output to sentence level in structured tasks with supervision level semantic cues. SLP Short Term Goal 3 (Week 2): Pt will utilize word finding strategies in structured tasks with supervision level verbal cues.  Skilled Therapeutic Interventions: Skilled treatment session focused on communication goals. SLP facilitated session by providing extra time and Min A question cues for patient to generate a structured sentence with use of function words while utilizing pictures. However, increased cueing and time was needed this morning for verbal expression during an informal conversation that focused on more abstract thoughts (discussion about safety awareness, etc). Patient left upright in bed with alarm on and all needs within reach. Continue with current plan of care.      Pain Pain Assessment Pain Scale: 0-10 Pain Score: 0-No pain  Therapy/Group: Individual Therapy  Delmer Kowalski 07/28/2020, 8:51 AM

## 2020-07-28 NOTE — Progress Notes (Signed)
[facility] PHYSICAL MEDICINE & REHABILITATION PROGRESS NOTE   Subjective/Complaints: Had a reasonable night. Fell yesterday afternoon when trying to get to BR. Mild tenderness of right hip. xrays negative. A little sore this morning  ROS: limited due to language/communication    Objective:   DG HIP UNILAT WITH PELVIS 2-3 VIEWS RIGHT  Result Date: 07/27/2020 CLINICAL DATA:  49 year old female with fall. EXAM: DG HIP (WITH OR WITHOUT PELVIS) 2-3V RIGHT COMPARISON:  None. FINDINGS: There is no evidence of hip fracture or dislocation. There is no evidence of arthropathy or other focal bone abnormality. IMPRESSION: Negative. Electronically Signed   By: Elgie Collard M.D.   On: 07/27/2020 18:42   No results for input(s): WBC, HGB, HCT, PLT in the last 72 hours. No results for input(s): NA, K, CL, CO2, GLUCOSE, BUN, CREATININE, CALCIUM in the last 72 hours.  Intake/Output Summary (Last 24 hours) at 07/28/2020 1036 Last data filed at 07/28/2020 0730 Gross per 24 hour  Intake 560 ml  Output --  Net 560 ml     Physical Exam: Vital Signs Blood pressure 125/76, pulse 64, temperature 97.7 F (36.5 C), temperature source Oral, resp. rate 16, height 5\' 3"  (1.6 m), weight 89.6 kg, SpO2 100 %. Constitutional: No distress . Vital signs reviewed. HEENT: EOMI, oral membranes moist Neck: supple Cardiovascular: RRR without murmur. No JVD    Respiratory/Chest: CTA Bilaterally without wheezes or rales. Normal effort    GI/Abdomen: BS +, non-tender, non-distended Ext: no clubbing, cyanosis, or edema Psych: pleasant but flat Skin: Clean and intact without signs of breakdown Neurological:     Mental Status: She is alert.     Comments: oriented x3. Apraxic speech. ?mild right inattention. Answers basic questions and follows commands with delay/some confusion at times.  Motor: Right hemiplegia RUE 2-3/5. RLE tr/5 Left upper extremity: 4-/5 proximal distal  Left lower extremity: 4+/5 proximal to distal   Musc: mild right hip tenderness with ROM     Assessment/Plan: 1. Functional deficits secondary to acute L ACA stroke which require 3+ hours per day of interdisciplinary therapy in a comprehensive inpatient rehab setting.  Physiatrist is providing close team supervision and 24 hour management of active medical problems listed below.  Physiatrist and rehab team continue to assess barriers to discharge/monitor patient progress toward functional and medical goals  Care Tool:  Bathing    Body parts bathed by patient: Chest, Abdomen, Front perineal area, Right upper leg, Left upper leg, Face, Left arm, Right arm, Buttocks, Left lower leg, Right lower leg   Body parts bathed by helper: Right arm, Left arm, Buttocks, Right lower leg, Left lower leg     Bathing assist Assist Level: Minimal Assistance - Patient > 75%     Upper Body Dressing/Undressing Upper body dressing   What is the patient wearing?: Dress    Upper body assist Assist Level: Minimal Assistance - Patient > 75%    Lower Body Dressing/Undressing Lower body dressing      What is the patient wearing?: Underwear/pull up, Pants     Lower body assist Assist for lower body dressing: Moderate Assistance - Patient 50 - 74%     Toileting Toileting    Toileting assist Assist for toileting: Minimal Assistance - Patient > 75%     Transfers Chair/bed transfer  Transfers assist     Chair/bed transfer assist level: Minimal Assistance - Patient > 75%     Locomotion Ambulation   Ambulation assist      Assist  level: Minimal Assistance - Patient > 75% Assistive device: Walker-rolling (R AFO & shoe cover) Max distance: 30 ft   Walk 10 feet activity   Assist     Assist level: Minimal Assistance - Patient > 75% Assistive device: Walker-rolling   Walk 50 feet activity   Assist Walk 50 feet with 2 turns activity did not occur: Safety/medical concerns         Walk 150 feet activity   Assist Walk  150 feet activity did not occur: Safety/medical concerns         Walk 10 feet on uneven surface  activity   Assist Walk 10 feet on uneven surfaces activity did not occur: Safety/medical concerns         Wheelchair     Assist Will patient use wheelchair at discharge?: No             Wheelchair 50 feet with 2 turns activity    Assist            Wheelchair 150 feet activity     Assist          Blood pressure 125/76, pulse 64, temperature 97.7 F (36.5 C), temperature source Oral, resp. rate 16, height 5\' 3"  (1.6 m), weight 89.6 kg, SpO2 100 %.    Medical Problem List and Plan: 1. 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              -patient may shower             -ELOS/Goals:  8/19/Min A            Continue CIR 2.  Antithrombotics: -DVT/anticoagulation:  Pharmaceutical: Lovenox             -antiplatelet therapy: On ASA, Plavix started for 6 months.  3. Pain Management: ?mild right hip contusion d/t fall yesterday  -continue ice/local care  -activity as tolerated 4. Mood: LCSW to follow for evaluation and support.              -antipsychotic agents: N/A 5. Neuropsych: This patient maybe capable of making decisions on her own behalf. 6. Skin/Wound Care: Routine pressure relief measures.  7. Fluids/Electrolytes/Nutrition: Monitor I/Os. Electrolytes stable 8. HTN: Norvasc, Bystolic. Monitor BP tid.  8/5: BP well controlled             Monitor with increased mobility. 9. GERD: On Protonix.  10. Hyperlipidemia: Trig-1549-->330 11. Hypokalemia:              Stable on 8/2 12. Stress induced hyperglycemia: Hgb A1c-5.6.              resolved 13: Neurogenic bladder: On Bactrim x5 days for UTI thru 8/5 14. Constipation: On senna  8/2 bm   LOS: 8 days A FACE TO FACE EVALUATION WAS PERFORMED  10/5 07/28/2020,  10:36 AM

## 2020-07-28 NOTE — Progress Notes (Signed)
Occupational Therapy Weekly Progress Note  Patient Details  Name: Tanya Lopez MRN: [id number] Date of Birth: 1971-03-19  Beginning of progress report period: July 21, 2020 End of progress report period: July 28, 2020  Today's Date: 07/28/2020 OT Individual Time: 1300-1400 OT Individual Time Calculation (min): 60 min   Patient has met 3 of 3 short term goals.  Pt has made steady progress towards OT goals this week. She has demonstrated improved sit<>stand, but continues to need mod/max A for any dynamic task within BADLs 2/2 R LE weakness and LOB. Pt has also been progressing within bathing/dressing tasks and has done well integrating R UE into functional tasks with mod verbal cues. Continue current POC.  Patient continues to demonstrate the following deficits: muscle weakness, decreased cardiorespiratoy endurance, abnormal tone, unbalanced muscle activation, motor apraxia, ataxia, decreased coordination and decreased motor planning, decreased attention to right, right side neglect and decreased motor planning, decreased awareness, decreased problem solving, decreased safety awareness and decreased memory and decreased sitting balance, decreased standing balance, decreased postural control, hemiplegia and decreased balance strategies and therefore will continue to benefit from skilled OT intervention to enhance overall performance with BADL.  Patient progressing toward long term goals..  Continue plan of care.  OT Short Term Goals Week 1:  OT Short Term Goal 1 (Week 1): Pt will complete toilet transfer with mod A of 1 OT Short Term Goal 1 - Progress (Week 1): Met OT Short Term Goal 2 (Week 1): Pt will maintain standing balance within bADL task with mod A OT Short Term Goal 2 - Progress (Week 1): Met OT Short Term Goal 3 (Week 1): Pt will complete 1 step of LB dressing task OT Short Term Goal 3 - Progress (Week 1): Met Week 2:  OT Short Term Goal 1 (Week 2): Pt will dress R UE first  with mod questioning cues OT Short Term Goal 2 (Week 2): Pt will open container using R hand with mod questioning cues OT Short Term Goal 3 (Week 2): Pt will use B UEs to pull up pants in standing  Skilled Therapeutic Interventions/Progress Updates:    Pt greeted semi-reclined in bed with spouse present and agreeable to OT treatment session. Pt agreeable to shower this afternoon. Pt completed bed mobility with min A. Mod A stand-pivot to wc, then to [facility] over toilet. Pt needed OT assist for clothing management prior to sitting on commode. Pt with successful void of bladder and able to complete peri-care. Pt then pivoted back to wc>tub bench>wc with mod A and use of grab bars. Pt needed mod cues to integrate R UE into bathing tasks with forced use throughout BADLs to bring attention to R side. Pt needed OT assist to bring R LE into figure 4 position, then she was able to thread pant legs. B UE coordination with pt blow drying hair with L hand, and combing  Hair with R. Mod/max cues to keep bruhsing with R hand. Continued working on R hand hand R attention with reaching for objects with R hand and brushing teeth with R hand. OT issued pt yellow theraputty and went through ball exercises. Pt left seated in wc with chair alarm on, call ball in reach, and telemonitor on.   Therapy Documentation Precautions:  Precautions Precautions: Fall Precaution Comments: R hemiplegia Restrictions Weight Bearing Restrictions: No Pain:  denies pain   Therapy/Group: Individual Therapy  Valma Cava 07/28/2020, 3:20 PM

## 2020-07-28 NOTE — Progress Notes (Signed)
Physical Therapy Session Note  Patient Details  Name: Tanya Lopez MRN: [id number] Date of Birth: [birth year]  Today's Date: 07/28/2020 PT Individual Time: 5183-4373 PT Individual Time Calculation (min): 41 min   Short Term Goals: Week 1:  PT Short Term Goal 1 (Week 1): Pt will perform bed mobility with modA PT Short Term Goal 1 - Progress (Week 1): Met PT Short Term Goal 2 (Week 1): Pt will perform bed to chair transfer with modA PT Short Term Goal 2 - Progress (Week 1): Met PT Short Term Goal 3 (Week 1): Pt will ambulate 60' with modA and LRAD PT Short Term Goal 3 - Progress (Week 1): Progressing toward goal Week 2:  PT Short Term Goal 1 (Week 2): Pt will ambulate 50 ft with LRAD & min assist. PT Short Term Goal 2 (Week 2): Pt will complete car transfer with LRAD & min assist. PT Short Term Goal 3 (Week 2): Pt will negotiate 4 steps with mod assist.  Skilled Therapeutic Interventions/Progress Updates:   Received pt supine in bed with husband present at bedside, pt agreeable to therapy, and denied any pain during session. Session with emphasis on functional mobility/transfers, generalized strengthening, R attention, NMR, motor control/planning, and improved activity tolerance. Pt transferred supine<>sitting EOB with min A for R LE management and donned socks, PLS AFO, and shoes max A for time management. Pt transferred bed<>WC squat<>pivot without AD and min A. Noted pt's dress soiled with blood spots. Inspected pt's abdomen and noted bleeding from spot on abdomen; therapist cleaned up pt and applied Band-Aid and pt transported to dayroom in [facility] total A for time management purposes. Pt transferred WC<>mat with min A without AD with therapist facilitating placement of R LE. Pt transferred sit<>stand min A and worked on squats 2x8 with R UE supported around therapist and therapist providing tactile cues for R knee extension and weight shifting onto R LE using mirror for visual feedback. Pt  transferred sit<>stand and worked on pre-gait stepping with L LE x 10 reps with emphasis on weight bearing through R LE using mirror for visual feedback. Pt required max A to advance R LE during pre-gait stepping x10 reps but required only mod A when bringing R LE back to neutral. Pt transferred sit<>stand and performed L LE toe taps to cone x 10 reps with min A with focus on weight shifting to R. Pt with multiple LOB requring mod A to correct. Pt required cues for slow, controlled movement and to weight shift onto R LE. Pt transported back to room in [facility] total A and transferred WC<>bed stand<>pivot min A. Doffed socks, shoes, and R PLS AFO max A and pt transferred sit<>supine with min A for R LE and scooted to [facility] with verbal cues and assist to place R LE in hooklying. Concluded session with pt supine in bed, needs within reach, and bed alarm on.   Therapy Documentation Precautions:  Precautions Precautions: Fall Precaution Comments: R hemiplegia Restrictions Weight Bearing Restrictions: No   Therapy/Group: Individual Therapy Alfonse Alpers PT, DPT   07/28/2020, 7:31 AM

## 2020-07-28 NOTE — Progress Notes (Signed)
Physical Therapy Session Note  Patient Details  Name: Tanya Lopez MRN: [id number] Date of Birth: 1971/08/20  Today's Date: 07/28/2020 PT Individual Time: 0916-1000 PT Individual Time Calculation (min): 44 min   Short Term Goals: Week 2:  PT Short Term Goal 1 (Week 2): Pt will ambulate 50 ft with LRAD & min assist. PT Short Term Goal 2 (Week 2): Pt will complete car transfer with LRAD & min assist. PT Short Term Goal 3 (Week 2): Pt will negotiate 4 steps with mod assist.  Skilled Therapeutic Interventions/Progress Updates:     Pt received supine in bed and agreeable to therapy. No pain. Bed mobility with minA management of RLE. PT provides threading of shorts and pt performs sit to stand with minA to pull up. PT then provides maxA to don shoes with LSO in R shoe. Stand pivot transfer to [facility] with minA HHA. WC transport to dayroom for time management.   Lite Gait training performed. Sit to stand with minA and pt able to maintain standing balance with supervision while PT dons harness. ModA for step up onto treadmill. Pt performs following bouts on treadmill:  1:53 for 115' at 0.7 mph 1:13 for 98' at 0.7 mph.  Mirror used for Financial trader. PT provides manual facilitation of RLE plantar flexion, hip flexion, and progression of RLE swing phase. Pt does not buckle in R knee during stance phase but requires totalA for movement of RLE. Pt has shortened stride length on L due to poor lateral weight shift to R.  ModA +2 to ambulate backward off treadmill. Pt performs stand pivot back to bed with modA and sit to supine with minA. Left supine in bed with alarm intact and all needs within reach.  Therapy Documentation Precautions:  Precautions Precautions: Fall Precaution Comments: R hemiplegia Restrictions Weight Bearing Restrictions: No    Therapy/Group: Individual Therapy  Beau Fanny, PT, DPT 07/28/2020, 4:29 PM

## 2020-07-29 ENCOUNTER — Inpatient Hospital Stay (HOSPITAL_COMMUNITY): Payer: BC Managed Care – PPO | Admitting: Physical Therapy

## 2020-07-29 ENCOUNTER — Inpatient Hospital Stay (HOSPITAL_COMMUNITY): Payer: BC Managed Care – PPO | Admitting: Speech Pathology

## 2020-07-29 ENCOUNTER — Encounter (HOSPITAL_COMMUNITY): Payer: BC Managed Care – PPO

## 2020-07-29 ENCOUNTER — Inpatient Hospital Stay (HOSPITAL_COMMUNITY): Payer: BC Managed Care – PPO | Admitting: Occupational Therapy

## 2020-07-29 NOTE — Progress Notes (Signed)
[place] PHYSICAL MEDICINE & REHABILITATION PROGRESS NOTE   Subjective/Complaints: No problems this morning. Already had completed a therapy/dressed. Denies pain  ROS: limited due to language/communication    Objective:   DG HIP UNILAT WITH PELVIS 2-3 VIEWS RIGHT  Result Date: 07/27/2020 CLINICAL DATA:  49 year old female with fall. EXAM: DG HIP (WITH OR WITHOUT PELVIS) 2-3V RIGHT COMPARISON:  None. FINDINGS: There is no evidence of hip fracture or dislocation. There is no evidence of arthropathy or other focal bone abnormality. IMPRESSION: Negative. Electronically Signed   By: Elgie Collard M.D.   On: 07/27/2020 18:42   No results for input(s): WBC, HGB, HCT, PLT in the last 72 hours. No results for input(s): NA, K, CL, CO2, GLUCOSE, BUN, CREATININE, CALCIUM in the last 72 hours.  Intake/Output Summary (Last 24 hours) at 07/29/2020 1143 Last data filed at 07/29/2020 0900 Gross per 24 hour  Intake 400 ml  Output --  Net 400 ml     Physical Exam: Vital Signs Blood pressure (!) 113/91, pulse 71, temperature (!) 97.5 F (36.4 C), resp. rate 16, height 5\' 3"  (1.6 m), weight 89.6 kg, SpO2 100 %. Constitutional: No distress . Vital signs reviewed. HEENT: EOMI, oral membranes moist Neck: supple Cardiovascular: RRR without murmur. No JVD    Respiratory/Chest: CTA Bilaterally without wheezes or rales. Normal effort    GI/Abdomen: BS +, non-tender, non-distended Ext: no clubbing, cyanosis, or edema Psych: pleasant and a little more animated today Skin: Clean and intact without signs of breakdown Neurological:     Mental Status: She is alert.     Comments: oriented x3. Apraxic speech but able to complete simple sentences and phrases with extra time. Named multiple objects around her correctly. Ongoing mild right inattention.   Motor: Right hemiplegia RUE 2-3/5. RLE tr-1/5 Left upper extremity: 4-/5 proximal distal  Left lower extremity: 4+/5 proximal to distal  Musc: mild right hip  tenderness with ROM     Assessment/Plan: 1. Functional deficits secondary to acute L ACA stroke which require 3+ hours per day of interdisciplinary therapy in a comprehensive inpatient rehab setting.  Physiatrist is providing close team supervision and 24 hour management of active medical problems listed below.  Physiatrist and rehab team continue to assess barriers to discharge/monitor patient progress toward functional and medical goals  Care Tool:  Bathing    Body parts bathed by patient: Chest, Abdomen, Front perineal area, Right upper leg, Left upper leg, Face, Left arm, Right arm, Buttocks, Left lower leg, Right lower leg   Body parts bathed by helper: Right arm, Left arm, Buttocks, Right lower leg, Left lower leg     Bathing assist Assist Level: Minimal Assistance - Patient > 75%     Upper Body Dressing/Undressing Upper body dressing   What is the patient wearing?: Dress    Upper body assist Assist Level: Minimal Assistance - Patient > 75%    Lower Body Dressing/Undressing Lower body dressing      What is the patient wearing?: Underwear/pull up, Pants     Lower body assist Assist for lower body dressing: Moderate Assistance - Patient 50 - 74%     Toileting Toileting    Toileting assist Assist for toileting: Minimal Assistance - Patient > 75%     Transfers Chair/bed transfer  Transfers assist     Chair/bed transfer assist level: Minimal Assistance - Patient > 75%     Locomotion Ambulation   Ambulation assist      Assist level: Total Assistance -  Patient < 25% Assistive device: Lite Gait Max distance: 115'   Walk 10 feet activity   Assist     Assist level: Total Assistance - Patient < 25% Assistive device: Lite Gait   Walk 50 feet activity   Assist Walk 50 feet with 2 turns activity did not occur: Safety/medical concerns         Walk 150 feet activity   Assist Walk 150 feet activity did not occur: Safety/medical concerns          Walk 10 feet on uneven surface  activity   Assist Walk 10 feet on uneven surfaces activity did not occur: Safety/medical concerns         Wheelchair     Assist Will patient use wheelchair at discharge?: No             Wheelchair 50 feet with 2 turns activity    Assist            Wheelchair 150 feet activity     Assist          Blood pressure (!) 113/91, pulse 71, temperature (!) 97.5 F (36.4 C), resp. rate 16, height 5\' 3"  (1.6 m), weight 89.6 kg, SpO2 100 %.    Medical Problem List and Plan: 1. 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              -patient may shower             -ELOS/Goals:  8/19/Min A            Continue CIR making gains in language, initiation. Spoke with husband today regarding progress and prognosis.  -AFO trial RLE 2.  Antithrombotics: -DVT/anticoagulation:  Pharmaceutical: Lovenox             -antiplatelet therapy: On ASA, Plavix started for 6 months.  3. Pain Management: ?mild right hip contusion d/t recent fall. Pain seems minimal  -continue ice/local care  -activity as tolerated 4. Mood: LCSW to follow for evaluation and support.              -antipsychotic agents: N/A 5. Neuropsych: This patient maybe capable of making decisions on her own behalf. 6. Skin/Wound Care: Routine pressure relief measures.  7. Fluids/Electrolytes/Nutrition: Monitor I/Os. Electrolytes stable 8. HTN: Norvasc, Bystolic.    8/6: BP under reasonable control with occasional acception               -monitor for pattern 9. GERD: On Protonix.  10. Hyperlipidemia: Trig-1549-->330 11. Hypokalemia:              Stable on 8/2 12. Stress induced hyperglycemia: Hgb A1c-5.6.              resolved 13: Neurogenic bladder: On Bactrim x5 days for UTI completed 8/5 14. Constipation: On senna  8/4 bm   LOS: 9 days A  FACE TO FACE EVALUATION WAS PERFORMED  02-19-2002 07/29/2020, 11:43 AM

## 2020-07-29 NOTE — Progress Notes (Signed)
Occupational Therapy Session Note  Patient Details  Name: Tanya Lopez MRN: [id number] Date of Birth: June 18, 1971  Today's Date: 07/29/2020 OT Individual Time: 1572-6203 OT Individual Time Calculation (min): 42 min   Short Term Goals: Week 2:  OT Short Term Goal 1 (Week 2): Pt will dress R UE first with mod questioning cues OT Short Term Goal 2 (Week 2): Pt will open container using R hand with mod questioning cues OT Short Term Goal 3 (Week 2): Pt will use B UEs to pull up pants in standing  Skilled Therapeutic Interventions/Progress Updates:    Pt greeted in circle sitting in bed awake and agreeable to OT treatment session. While in circle sitting, OT had pt don her socks in bed with set-up A and was able to integrate B UEs without verbal cues. Pt needed OT assist to get R LE out of circle sitting and come to sitting EOB. Worked on donning shoes at EOB with pt able to don L shoe and tie shoe using both hands with min A. Pt then needed max A to don R shoe and AFO. Pt completed stand-pivot to wc with mod A. OT brought breakfast tray and practiced use of R UE within self-feeding tasks. Pt needed min cues to integrate R UE, but was able to open containers with R hand. Worked on B UE task of cutting food with knife in R hand and min A initially to achieve cutting motion, then pt able to demonstrate and cut food. Pt with difficulty on first few bites maintaining upright fork position, but after OT demonstration and practice, she was able to maintain correct utensil position to get food bolus into mouth without spillage. Continued working on R hand fine motor control with picking small beads out of soft yellow theraputty. Pt left seated in wc at end of session with alarm belt on, call bell in reach, and needs met.    Therapy Documentation Precautions:  Precautions Precautions: Fall Precaution Comments: R hemiplegia Restrictions Weight Bearing Restrictions: No Pain:   denies  pain  Therapy/Group: Individual Therapy  Valma Cava 07/29/2020, 7:58 AM

## 2020-07-29 NOTE — Plan of Care (Signed)
°  Problem: Consults Goal: RH STROKE PATIENT EDUCATION Description: See Patient Education module for education specifics  Outcome: Progressing   Problem: RH BOWEL ELIMINATION Goal: RH STG MANAGE BOWEL WITH ASSISTANCE Description: STG Manage Bowel with min Assistance. Outcome: Progressing Goal: RH STG MANAGE BOWEL W/MEDICATION W/ASSISTANCE Description: STG Manage Bowel with Medication with min Assistance. Outcome: Progressing   Problem: RH SKIN INTEGRITY Goal: RH STG SKIN FREE OF INFECTION/BREAKDOWN Description: Skin to remain free from breakdown with min assist while on rehab. Outcome: Progressing   Problem: RH PAIN MANAGEMENT Goal: RH STG PAIN MANAGED AT OR BELOW PT'S PAIN GOAL Description: <4 on a 0-10 pain scale. Outcome: Progressing   Problem: RH SAFETY Goal: RH STG ADHERE TO SAFETY PRECAUTIONS W/ASSISTANCE/DEVICE Description: STG Adhere to Safety Precautions With min Assistance and appropriate assistive Device. Outcome: Progressing

## 2020-07-29 NOTE — Progress Notes (Signed)
Physical Therapy Session Note  Patient Details  Name: Tanya Lopez MRN: [id number] Date of Birth: 25-Mar-1971  Today's Date: 07/29/2020 PT Individual Time: 7893-8101 PT Individual Time Calculation (min): 44 min   Short Term Goals: Week 2:  PT Short Term Goal 1 (Week 2): Pt will ambulate 50 ft with LRAD & min assist. PT Short Term Goal 2 (Week 2): Pt will complete car transfer with LRAD & min assist. PT Short Term Goal 3 (Week 2): Pt will negotiate 4 steps with mod assist.  Skilled Therapeutic Interventions/Progress Updates:  Pt received in w/c & agreeable to tx. Transported pt to/from gym via w/c dependent assist. Stand pivot w/c<>mat table with min assist with max cuing for hand placement, sequencing, & technique. Sit<>supine with min assist with pt not attending to RUE/RLE. Pt positioned in L sidelying with powder board and e-stim applied to quads (see info below). In conjunction with e-stim, had pt attempt to extend R knee to focus on R NMR with pt requiring max assist as very little quad activation noted. Even after use of e-stim, no significant change noted in R quads & no redness or adverse reactions noted to area. Pt left in w/c with chair alarm donned, call bell in reach, telesitter in room.  E-stim specifications: Off: 5 Rate: 35 Width: 300 Waveform: asymmetrical Cycling: 1 Ch Turned up to 9.5 Duration: 10 minutes  Therapy Documentation Precautions:  Precautions Precautions: Fall Precaution Comments: R hemiplegia Restrictions Weight Bearing Restrictions: No  Pain: No c/o pain during session.   Therapy/Group: Individual Therapy  Sandi Mariscal 07/29/2020, 12:43 PM

## 2020-07-29 NOTE — Progress Notes (Signed)
Occupational Therapy Session Note  Patient Details  Name: Tanya Lopez MRN: [id number] Date of Birth: [birth year]  Today's Date: 07/29/2020 OT Group Time: 1100-1200 OT Group Time Calculation (min): 60 min    Short Term Goals: Week 1:  OT Short Term Goal 1 (Week 1): Pt will complete toilet transfer with mod A of 1 OT Short Term Goal 1 - Progress (Week 1): Met OT Short Term Goal 2 (Week 1): Pt will maintain standing balance within bADL task with mod A OT Short Term Goal 2 - Progress (Week 1): Met OT Short Term Goal 3 (Week 1): Pt will complete 1 step of LB dressing task OT Short Term Goal 3 - Progress (Week 1): Met  Skilled Therapeutic Interventions/Progress Updates:    Pt received in w/c agreeable to group. No pain reported during session. Repositioning, rest, and alternative strategies pain strategies approached in session ot use at another time. Pt participated in an adaptive yoga group to addresses balance, strengthening, improving ROM/flexibility, alternative pain strategies, stress reduction and improve social participation/adjustment to situation by interacting with peers. Patient was led through warm up exercises with RUE and LE AAROM provided, formal adapted seated yoga poses, meditation and breath-work to address the above deficits. Pt tolerated well and returned to room with call light in reach and all needs met.  Therapy Documentation Precautions:  Precautions Precautions: Fall Precaution Comments: R hemiplegia Restrictions Weight Bearing Restrictions: No General:   Vital Signs:  Pain: Pain Assessment Pain Scale: 0-10 Pain Score: 0-No pain ADL: ADL Eating: Minimal assistance Grooming: Minimal assistance Upper Body Bathing: Moderate assistance Lower Body Bathing: Maximal assistance Upper Body Dressing: Maximal assistance Lower Body Dressing: Maximal assistance Toileting: Maximal assistance Toilet Transfer: Maximal assistance Vision   Perception     Praxis   Exercises:   Other Treatments:     Therapy/Group: Group Therapy  [name] 07/29/2020, 12:07 PM

## 2020-07-29 NOTE — Progress Notes (Signed)
Speech Language Pathology Daily Session Note  Patient Details  Name: Tanya Lopez MRN: [id number] Date of Birth: 09-04-1971  Today's Date: 07/29/2020 SLP Individual Time: 1400-1500 SLP Individual Time Calculation (min): 60 min  Short Term Goals: Week 2: SLP Short Term Goal 1 (Week 2): Pt will express wants/needs at phrase level with supervision level multimodal cues. SLP Short Term Goal 2 (Week 2): Pt will increase verbal output to sentence level in structured tasks with supervision level semantic cues. SLP Short Term Goal 3 (Week 2): Pt will utilize word finding strategies in structured tasks with supervision level verbal cues.  Skilled Therapeutic Interventions:   Patient seen for skilled ST therapy to address her speech and language goals. She was able to describe verb photos at phrase level with minimal cues and described 'What is wrong?' with photos of people doing unsafe things, ie: "fire hazard" with picture of groceries on stove, and expanded to 3-4 word phrases with semantic cues. She described objects when presented with word, ie "call on it" for phone and required minimal cues to perform. She described at word and some 2-word phrases what she did with therapy , "yoga" and "eating my food" (OT helped her at breakfast meal). She was able to type phrases to describe on tablet as well. Patient continues to have the most difficulty with semi-structured and unstructured conversation when more abstract, such as when she was trying to tell SLP why she hasnt been watching netflix here at hospital, and eventually said, "we talk about Olympics" and then with guided discussion with SLP assistance, indicated that she has been watching Olympics instead of "murder mystery romance" shows and movies on Netflix. Patient continues to improve and benefit from skilled speech-language therapy to maximize her expressive communication and receptive language.  Pain Pain Assessment Pain Scale: 0-10 Pain  Score: 0-No pain  Therapy/Group: Individual Therapy  Angela Nevin, MA, CCC-SLP 07/29/20 4:52 PM

## 2020-07-30 ENCOUNTER — Encounter (HOSPITAL_COMMUNITY): Payer: BC Managed Care – PPO | Admitting: Occupational Therapy

## 2020-07-30 NOTE — Plan of Care (Signed)
°  Problem: Consults °Goal: RH STROKE PATIENT EDUCATION °Description: See Patient Education module for education specifics  °Outcome: Progressing °  °Problem: RH BOWEL ELIMINATION °Goal: RH STG MANAGE BOWEL WITH ASSISTANCE °Description: STG Manage Bowel with min Assistance. °Outcome: Progressing °Goal: RH STG MANAGE BOWEL W/MEDICATION W/ASSISTANCE °Description: STG Manage Bowel with Medication with min Assistance. °Outcome: Progressing °  °Problem: RH SKIN INTEGRITY °Goal: RH STG SKIN FREE OF INFECTION/BREAKDOWN °Description: Skin to remain free from breakdown with min assist while on rehab. °Outcome: Progressing °  °Problem: RH PAIN MANAGEMENT °Goal: RH STG PAIN MANAGED AT OR BELOW PT'S PAIN GOAL °Description: <4 on a 0-10 pain scale. °Outcome: Progressing °  °Problem: RH SAFETY °Goal: RH STG ADHERE TO SAFETY PRECAUTIONS W/ASSISTANCE/DEVICE °Description: STG Adhere to Safety Precautions With min Assistance and appropriate assistive Device. °Outcome: Progressing °  °

## 2020-07-30 NOTE — Progress Notes (Signed)
[place] PHYSICAL MEDICINE & REHABILITATION PROGRESS NOTE   Subjective/Complaints: No issues per RN, husband concerned about foot drop   ROS: limited due to language/communication    Objective:   No results found. No results for input(s): WBC, HGB, HCT, PLT in the last 72 hours. No results for input(s): NA, K, CL, CO2, GLUCOSE, BUN, CREATININE, CALCIUM in the last 72 hours.  Intake/Output Summary (Last 24 hours) at 07/30/2020 0942 Last data filed at 07/30/2020 0730 Gross per 24 hour  Intake 580 ml  Output --  Net 580 ml     Physical Exam: Vital Signs Blood pressure (!) 130/58, pulse 71, temperature 98.3 F (36.8 C), temperature source Oral, resp. rate 16, height 5\' 3"  (1.6 m), weight 90 kg, SpO2 100 %.  General: No acute distress Mood and affect are appropriate Heart: Regular rate and rhythm no rubs murmurs or extra sounds Lungs: Clear to auscultation, breathing unlabored, no rales or wheezes Abdomen: Positive bowel sounds, soft nontender to palpation, nondistended Extremities: No clubbing, cyanosis, or edema Skin: No evidence of breakdown, no evidence of rash  Neurological:     Mental Status: She is alert.     Comments: oriented x3. Apraxic speech but able to complete simple sentences and phrases with extra time. Named multiple objects around her correctly. Ongoing mild right inattention.   Motor: Right hemiplegia RUE 2-3/5. RLE tr-1/5 Left upper extremity: 4-/5 proximal distal  Left lower extremity: 4+/5 proximal to distal  Musc: mild right hip tenderness with ROM     Assessment/Plan: 1. Functional deficits secondary to acute L ACA stroke which require 3+ hours per day of interdisciplinary therapy in a comprehensive inpatient rehab setting.  Physiatrist is providing close team supervision and 24 hour management of active medical problems listed below.  Physiatrist and rehab team continue to assess barriers to discharge/monitor patient progress toward functional and  medical goals  Care Tool:  Bathing    Body parts bathed by patient: Chest, Abdomen, Front perineal area, Right upper leg, Left upper leg, Face, Left arm, Right arm, Buttocks, Left lower leg, Right lower leg   Body parts bathed by helper: Right arm, Left arm, Buttocks, Right lower leg, Left lower leg     Bathing assist Assist Level: Minimal Assistance - Patient > 75%     Upper Body Dressing/Undressing Upper body dressing   What is the patient wearing?: Dress    Upper body assist Assist Level: Minimal Assistance - Patient > 75%    Lower Body Dressing/Undressing Lower body dressing      What is the patient wearing?: Underwear/pull up, Pants     Lower body assist Assist for lower body dressing: Moderate Assistance - Patient 50 - 74%     Toileting Toileting    Toileting assist Assist for toileting: Minimal Assistance - Patient > 75%     Transfers Chair/bed transfer  Transfers assist     Chair/bed transfer assist level: Minimal Assistance - Patient > 75%     Locomotion Ambulation   Ambulation assist      Assist level: Total Assistance - Patient < 25% Assistive device: Lite Gait Max distance: 115'   Walk 10 feet activity   Assist     Assist level: Total Assistance - Patient < 25% Assistive device: Lite Gait   Walk 50 feet activity   Assist Walk 50 feet with 2 turns activity did not occur: Safety/medical concerns         Walk 150 feet activity   Assist  Walk 150 feet activity did not occur: Safety/medical concerns         Walk 10 feet on uneven surface  activity   Assist Walk 10 feet on uneven surfaces activity did not occur: Safety/medical concerns         Wheelchair     Assist Will patient use wheelchair at discharge?: No             Wheelchair 50 feet with 2 turns activity    Assist            Wheelchair 150 feet activity     Assist          Blood pressure (!) 130/58, pulse 71, temperature 98.3  F (36.8 C), temperature source Oral, resp. rate 16, height 5\' 3"  (1.6 m), weight 90 kg, SpO2 100 %.    Medical Problem List and Plan: 1. 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              -patient may shower             -ELOS/Goals:  8/19/Min A            Continue CIR making gains in language, initiation. Spoke with husband today regarding progress and prognosis.  -AFO trial RLE, order PRAFO 2.  Antithrombotics: -DVT/anticoagulation:  Pharmaceutical: Lovenox             -antiplatelet therapy: On ASA, Plavix started for 6 months.  3. Pain Management: ?mild right hip contusion d/t recent fall. Pain seems minimal  -continue ice/local care  -activity as tolerated 4. Mood: LCSW to follow for evaluation and support.              -antipsychotic agents: N/A 5. Neuropsych: This patient maybe capable of making decisions on her own behalf. 6. Skin/Wound Care: Routine pressure relief measures.  7. Fluids/Electrolytes/Nutrition: Monitor I/Os. Electrolytes stable 8. HTN: Norvasc, Bystolic.    8/6: BP under reasonable control with occasional acception               -monitor for pattern 9. GERD: On Protonix.  10. Hyperlipidemia: Trig-1549-->330 11. Hypokalemia:              Stable on 8/2 12. Stress induced hyperglycemia: Hgb A1c-5.6.              resolved 13: Neurogenic bladder: On Bactrim x5 days for UTI completed 8/5 14. Constipation: On senna  8/4 bm   LOS: 10 days A FACE TO FACE EVALUATION WAS PERFORMED  02-19-2002 07/30/2020, 9:42 AM

## 2020-07-30 NOTE — Progress Notes (Signed)
Orthopedic Tech Progress Note Patient Details:  Tanya Lopez 10/02/1971 [id number] Ordered outside vendor brace Patient ID: [name], female   DOB: October 26, 1971, 49 y.o.   MRN: [id number]   [name] 07/30/2020, 12:54 PM

## 2020-07-30 NOTE — Progress Notes (Signed)
Occupational Therapy Session Note  Patient Details  Name: Tanya Lopez MRN: [id number] Date of Birth: 10/05/1971  Today's Date: 07/30/2020 OT Group Time: 1100-1200 OT Group Time Calculation (min): 60 min  Skilled Therapeutic Interventions/Progress Updates:    Pt engaged in therapeutic w/c level dance group focusing on patient choice, UE/LE strengthening, salience, activity tolerance, and social participation. Pt was guided through various dance-based exercises involving UEs/LEs and trunk. All music was selected by group members. Emphasis placed on Rt NMR and activity tolerance. Pt was highly participative in group(!), incorporating active and active assist techniques for the Rt UE with demonstrational and tactile cues, active assist for Rt LE. Pt incorporating Rt UE/LE involvement simultaneously during most songs, smiling. At end of session she was escorted back to the room and left in the w/c with all needs within reach and safety belt fastened.    Therapy Documentation Precautions:  Precautions Precautions: Fall Precaution Comments: R hemiplegia Restrictions Weight Bearing Restrictions: No Vital Signs: Therapy Vitals Temp: 98.5 F (36.9 C) Temp Source: Oral Pulse Rate: 80 Resp: 17 BP: 127/71 Patient Position (if appropriate): Lying Oxygen Therapy SpO2: 99 % O2 Device: Room Air Pain: no s/s pain during tx   ADL: ADL Eating: Minimal assistance Grooming: Minimal assistance Upper Body Bathing: Moderate assistance Lower Body Bathing: Maximal assistance Upper Body Dressing: Maximal assistance Lower Body Dressing: Maximal assistance Toileting: Maximal assistance Toilet Transfer: Maximal assistance:     Therapy/Group: Group Therapy  Wyett Narine A Liboria Putnam 07/30/2020, 4:24 PM

## 2020-07-31 ENCOUNTER — Inpatient Hospital Stay (HOSPITAL_COMMUNITY): Payer: BC Managed Care – PPO | Admitting: Physical Therapy

## 2020-07-31 NOTE — Progress Notes (Signed)
[facility] PHYSICAL MEDICINE & REHABILITATION PROGRESS NOTE   Subjective/Complaints:  No issues overnite remains aphasic but can speak at word level  ROS: limited due to language/communication    Objective:   No results found. No results for input(s): WBC, HGB, HCT, PLT in the last 72 hours. No results for input(s): NA, K, CL, CO2, GLUCOSE, BUN, CREATININE, CALCIUM in the last 72 hours.  Intake/Output Summary (Last 24 hours) at 07/31/2020 0831 Last data filed at 07/30/2020 1949 Gross per 24 hour  Intake 380 ml  Output --  Net 380 ml     Physical Exam: Vital Signs Blood pressure 139/63, pulse 73, temperature 98.2 F (36.8 C), temperature source Oral, resp. rate 19, height 5\' 3"  (1.6 m), weight 90 kg, SpO2 100 %.   General: No acute distress Mood and affect are appropriate Heart: Regular rate and rhythm no rubs murmurs or extra sounds Lungs: Clear to auscultation, breathing unlabored, no rales or wheezes Abdomen: Positive bowel sounds, soft nontender to palpation, nondistended Extremities: No clubbing, cyanosis, or edema Skin: No evidence of breakdown, no evidence of rash  Neurological:     Mental Status: She is alert.     Comments: oriented x3. Apraxic speech but able to complete simple sentences and phrases with extra time. Named multiple objects around her correctly. Ongoing mild right inattention.   Motor: Right hemiplegia RUE 3/5. RLE tr-1/5 Left upper extremity: 4-/5 proximal distal  Left lower extremity: 4+/5 proximal to distal  Musc: mild right hip tenderness with ROM     Assessment/Plan: 1. Functional deficits secondary to acute L ACA stroke which require 3+ hours per day of interdisciplinary therapy in a comprehensive inpatient rehab setting.  Physiatrist is providing close team supervision and 24 hour management of active medical problems listed below.  Physiatrist and rehab team continue to assess barriers to discharge/monitor patient progress toward  functional and medical goals  Care Tool:  Bathing    Body parts bathed by patient: Chest, Abdomen, Front perineal area, Right upper leg, Left upper leg, Face, Left arm, Right arm, Buttocks, Left lower leg, Right lower leg   Body parts bathed by helper: Right arm, Left arm, Buttocks, Right lower leg, Left lower leg     Bathing assist Assist Level: Minimal Assistance - Patient > 75%     Upper Body Dressing/Undressing Upper body dressing   What is the patient wearing?: Dress    Upper body assist Assist Level: Minimal Assistance - Patient > 75%    Lower Body Dressing/Undressing Lower body dressing      What is the patient wearing?: Underwear/pull up, Pants     Lower body assist Assist for lower body dressing: Moderate Assistance - Patient 50 - 74%     Toileting Toileting    Toileting assist Assist for toileting: Minimal Assistance - Patient > 75%     Transfers Chair/bed transfer  Transfers assist     Chair/bed transfer assist level: Minimal Assistance - Patient > 75%     Locomotion Ambulation   Ambulation assist      Assist level: Total Assistance - Patient < 25% Assistive device: Lite Gait Max distance: 115'   Walk 10 feet activity   Assist     Assist level: Total Assistance - Patient < 25% Assistive device: Lite Gait   Walk 50 feet activity   Assist Walk 50 feet with 2 turns activity did not occur: Safety/medical concerns         Walk 150 feet activity  Assist Walk 150 feet activity did not occur: Safety/medical concerns         Walk 10 feet on uneven surface  activity   Assist Walk 10 feet on uneven surfaces activity did not occur: Safety/medical concerns         Wheelchair     Assist Will patient use wheelchair at discharge?: No             Wheelchair 50 feet with 2 turns activity    Assist            Wheelchair 150 feet activity     Assist          Blood pressure 139/63, pulse 73,  temperature 98.2 F (36.8 C), temperature source Oral, resp. rate 19, height 5\' 3"  (1.6 m), weight 90 kg, SpO2 100 %.    Medical Problem List and Plan: 1. 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              -patient may shower             -ELOS/Goals:  8/19/Min A            Continue CIR making gains in language, initiation. Spoke with husband today regarding progress and prognosis.  -AFO trial RLE, order PRAFO 2.  Antithrombotics: -DVT/anticoagulation:  Pharmaceutical: Lovenox             -antiplatelet therapy: On ASA, Plavix started for 6 months.  3. Pain Management: ?mild right hip contusion d/t recent fall. Pain seems minimal  -continue ice/local care  -activity as tolerated 4. Mood: LCSW to follow for evaluation and support.              -antipsychotic agents: N/A 5. Neuropsych: This patient maybe capable of making decisions on her own behalf. 6. Skin/Wound Care: Routine pressure relief measures.  7. Fluids/Electrolytes/Nutrition: Monitor I/Os. Electrolytes stable 8. HTN: Norvasc, Bystolic.   Vitals:   07/30/20 2000 07/31/20 0517  BP: 129/74 139/63  Pulse: 72 73  Resp: 17 19  Temp: 98.2 F (36.8 C) 98.2 F (36.8 C)  SpO2: 100% 100%  Controlled 8/8 9. GERD: On Protonix.  10. Hyperlipidemia: Trig-1549-->330 11. Hypokalemia:              Stable on 8/2 12. Stress induced hyperglycemia: Hgb A1c-5.6.              resolved 13: Neurogenic bladder: On Bactrim x5 days for UTI completed 8/5 14. Constipation: On senna  8/4 bm   LOS: 11 days A FACE TO FACE EVALUATION WAS PERFORMED  09/30/20 07/31/2020, 8:31 AM

## 2020-07-31 NOTE — Progress Notes (Signed)
Physical Therapy Session Note  Patient Details  Name: Tanya Lopez MRN: [id number] Date of Birth: 1971/03/26  Today's Date: 07/31/2020 PT Individual Time: 1105-1200 PT Individual Time Calculation (min): 55 min   Short Term Goals: Week 2:  PT Short Term Goal 1 (Week 2): Pt will ambulate 50 ft with LRAD & min assist. PT Short Term Goal 2 (Week 2): Pt will complete car transfer with LRAD & min assist. PT Short Term Goal 3 (Week 2): Pt will negotiate 4 steps with mod assist.  Skilled Therapeutic Interventions/Progress Updates: Pt presents supine in bed and agreeable to therapy.  Pt required min A for sup to sit at EOB,  Threaded RLE through pant leg, pt able to bring LLE into leg and pulled over knees.  Pt required total Ato don socks and shoes as well as AFO to R foot seated.  Pt transferred sit to stand w/ min A and pulled pants over hips w/o LOB.  Pt transferred SPT bed to w/c w/ min A and assist for wt shift to advance RLE.  Pt wheeled to gym for time conservation.  Pt performed multiple sit to stand transfers at rail w/ education on UEs placement on w/c arm rests vs pulling.  Pt performed multiple step forward and back w/ RLE and manual assist especially for stepping back.  Toe cap was applied on RLE.  Pt performed amb at rail forward and back to facilitate wt shift to left, manual assist to advance RLE and for placement.  Pt amb w/ RW and min A up to 50' including turn to sit on chair.Pt able to advance RLE but fatigues.  Pt performed multiple SPT w/c <> arm chair w/ min A and HHA on RUE, verbal cues for complete turns to then use RUE for stand to sit.  Pt returned to room and remained in w/c for lunch.  AFO removed and slipper socks donned, no skin issue noted R foot.  Chair alarm on and all needs in reach.     Therapy Documentation Precautions:  Precautions Precautions: Fall Precaution Comments: R hemiplegia Restrictions Weight Bearing Restrictions: No General:   Vital Signs:    Pain:0/10 Pain Assessment Pain Scale: 0-10 Pain Score: 0-No pain    Therapy/Group: Individual Therapy  Lucio Edward 07/31/2020, 12:06 PM

## 2020-08-01 ENCOUNTER — Inpatient Hospital Stay (HOSPITAL_COMMUNITY): Payer: BC Managed Care – PPO

## 2020-08-01 ENCOUNTER — Inpatient Hospital Stay (HOSPITAL_COMMUNITY): Payer: BC Managed Care – PPO | Admitting: Occupational Therapy

## 2020-08-01 ENCOUNTER — Inpatient Hospital Stay (HOSPITAL_COMMUNITY): Payer: BC Managed Care – PPO | Admitting: Physical Therapy

## 2020-08-01 LAB — CBC
HCT: 39.3 % (ref 36.0–46.0)
Hemoglobin: 12.3 g/dL (ref 12.0–15.0)
MCH: 25.3 pg — ABNORMAL LOW (ref 26.0–34.0)
MCHC: 31.3 g/dL (ref 30.0–36.0)
MCV: 80.9 fL (ref 80.0–100.0)
Platelets: 354 10*3/uL (ref 150–400)
RBC: 4.86 MIL/uL (ref 3.87–5.11)
RDW: 20.2 % — ABNORMAL HIGH (ref 11.5–15.5)
WBC: 6.6 10*3/uL (ref 4.0–10.5)
nRBC: 0 % (ref 0.0–0.2)

## 2020-08-01 LAB — BASIC METABOLIC PANEL
Anion gap: 11 (ref 5–15)
BUN: 18 mg/dL (ref 6–20)
CO2: 22 mmol/L (ref 22–32)
Calcium: 9.5 mg/dL (ref 8.9–10.3)
Chloride: 104 mmol/L (ref 98–111)
Creatinine, Ser: 0.88 mg/dL (ref 0.44–1.00)
GFR calc Af Amer: >60 mL/min (ref >60)
GFR calc non Af Amer: >60 mL/min (ref >60)
Glucose, Bld: 107 mg/dL — ABNORMAL HIGH (ref 70–99)
Potassium: 3.8 mmol/L (ref 3.5–5.1)
Sodium: 137 mmol/L (ref 135–145)

## 2020-08-01 NOTE — Progress Notes (Signed)
Occupational Therapy Session Note  Patient Details  Name: Tanya Lopez MRN: [id number] Date of Birth: [birth year]  Today's Date: 08/01/2020 OT Individual Time: 1300-1330 OT Individual Time Calculation (min): 30 min    Short Term Goals: Week 2:  OT Short Term Goal 1 (Week 2): Pt will dress R UE first with mod questioning cues OT Short Term Goal 2 (Week 2): Pt will open container using R hand with mod questioning cues OT Short Term Goal 3 (Week 2): Pt will use B UEs to pull up pants in standing  Skilled Therapeutic Interventions/Progress Updates:    Pt received supine with no c/o pain. Discussed AFO and shoe fit with pt and her husband. Pt transitioned to EOB and with husband assistance, donned new shoe with AFO. Shoe potentially too small but will defer to PT on proper fit. Pt completed stand pivot transfer to the w/c with min A, mod cueing for RLE positioning. Pt was taken to the therapy gym to complete dynavision. Isolated RUE for functional reaching in sitting and standing. Frequent balance perturbations in standing with min A overall provided. Pt returned to the w/c and was taken back to her room. Encouraged pt to stay sitting up but she preferred to go back to bed. Pt transferred to bed via stand pivot with min A. Pt left supine with all needs met, bed alarm set.   Therapy Documentation Precautions:  Precautions Precautions: Fall Precaution Comments: R hemiplegia Restrictions Weight Bearing Restrictions: No  Therapy/Group: Individual Therapy  Curtis Sites 08/01/2020, 6:37 AM

## 2020-08-01 NOTE — Progress Notes (Signed)
Occupational Therapy Session Note  Patient Details  Name: Tanya Lopez MRN: [id number] Date of Birth: February 09, 1971  Today's Date: 08/01/2020 OT Individual Time: 1100-1200 OT Individual Time Calculation (min): 60 min   Short Term Goals: Week 2:  OT Short Term Goal 1 (Week 2): Pt will dress R UE first with mod questioning cues OT Short Term Goal 2 (Week 2): Pt will open container using R hand with mod questioning cues OT Short Term Goal 3 (Week 2): Pt will use B UEs to pull up pants in standing  Skilled Therapeutic Interventions/Progress Updates:    Pt greeted circle sitting in bed and agreeable to OT treatment session. Pt wanted to shower today. Pt completed stand-pivot from bed to wc with mod A. Pt brought into bathroom and completed stand-pivot to tub bench with verbal cues for attention to R LE prior to standing. Mod A for pivot into shower. Pt demonstrated poor awareness by trying to wash body before wetting wash cloth or putting soap on cloth, requiring verbal cues. Focus on R UE NMR and R attention with forced use of R UE throughout bathing tasks. Dressing completed from wc at the sink with pt needing mod cues to integrate R UE into dressing tasks. Worked on R hand fine motor control with opening/closing containers. Had pt use R UE for entire hair drying task. Worked on B UE coordination with towel folding task. Pt left seated in wc with alarm belt on, call bell in reach, and needs met.   Therapy Documentation Precautions:  Precautions Precautions: Fall Precaution Comments: R hemiplegia Restrictions Weight Bearing Restrictions: No Pain: Pain Assessment Pain Scale: 0-10 Pain Score: 0-No pain  Therapy/Group: Individual Therapy  Valma Cava 08/01/2020, 11:25 AM

## 2020-08-01 NOTE — Progress Notes (Signed)
Speech Language Pathology Daily Session Note  Patient Details  Name: Tanya Lopez MRN: [id number] Date of Birth: March 02, 1971  Today's Date: 08/01/2020 SLP Individual Time: 0905-1003 SLP Individual Time Calculation (min): 58 min  Short Term Goals: Week 2: SLP Short Term Goal 1 (Week 2): Pt will express wants/needs at phrase level with supervision level multimodal cues. SLP Short Term Goal 2 (Week 2): Pt will increase verbal output to sentence level in structured tasks with supervision level semantic cues. SLP Short Term Goal 3 (Week 2): Pt will utilize word finding strategies in structured tasks with supervision level verbal cues.  Skilled Therapeutic Interventions: Skilled ST services focused on language skills. Pt communicated with MD with response to yes/no questions, word level and occasional simple phrase level of 2-3 words with extra time. SLP facilitated increasing verbal output in structured task with picture cards (what's wrong), pt initially verbalizing phrases 3-5 words with mod A verbal cues to increase length/syntax structure increasing to 10-11 words sentences (halfway through task) with only supervision A verbal cues to correct syntax errors. Pt demonstrated ability to type 10-12 word sentences based on the verbalized pictures, requiring supervision A verbal cues to correct syntax errors, while spelling errors were corrected through auto-correct. SLP also facilitated word finding skills in structured task, pt required supervision A semantic cues to name item given 3 word description and provided definition of items (ex: belt, pen...etc) with an average of 3 word utterances. Pt continues to demonstrate increased verbal output during structured verse unstructured tasks. Pt was left in room with call bell within reach and bed alarm set. ST recommends to continue skilled ST services.      Pain Pain Assessment Pain Score: 0-No pain  Therapy/Group: Individual Therapy  Sorina Derrig   [facility] 08/01/2020, 12:49 PM

## 2020-08-01 NOTE — Progress Notes (Signed)
Physical Therapy Session Note  Patient Details  Name: Tanya Lopez MRN: [id number] Date of Birth: 1971-12-21  Today's Date: 08/01/2020 PT Individual Time: 1000-1044 and 26 min PT Individual Time Calculation (min): 44 min and 1503-1529  Short Term Goals: Week 2:  PT Short Term Goal 1 (Week 2): Pt will ambulate 50 ft with LRAD & min assist. PT Short Term Goal 2 (Week 2): Pt will complete car transfer with LRAD & min assist. PT Short Term Goal 3 (Week 2): Pt will negotiate 4 steps with mod assist.  Skilled Therapeutic Interventions/Progress Updates:  Treatment 1: Pt received in bed & agreeable to tx. Assisted pt with dressing & pt requires cuing for awareness that she needs to doff gown before donning shirt. Pt stands EOB with min assist & PT assisting with pulling pants over R hips. PT dons B shoes & R AFO total assist; contacted pt's husband via telephone & requested he bring in tennis shoes 1/2 size bigger & with removable tongue for increased comfort & ease with use of AFO. Stand pivot to w/c on R with min assist with cuing for hand placement. Removed R hand orthosis from RW as pt able to grip RW without it now. Sit<>stand from w/c with RW & cuing for hand placement. Gait x 50 ft + 70 ft with RW, R AFO & shoe cover to simulate toe cap, & min assist with PT assisting with forward hip rotation on R during swing phase with pt demonstrating improved ability to advance RLE during swing phase with verbal cuing. (Pt with slightly improved quad activation in RLE in sitting.) Pt engaged in standing without BUE support & min assist while kicking cones with RLE to focus on RLE strengthening & NMR to simulate RLE swing phase during gait. Back in room, pt requires max assist to recall events of session for memory notebook. Pt left in w/c with chair alarm donned & call bell & needs in reach. Educated pt that she should not get out of w/c without staff assistance. Pain: pt denies c/o pain, reports AFO feels  okay  Treatment 2: Pt received in bed with husband present in room with mask below chin but adjusting it properly when PT entered - PT educated him on need to wear mask over nose & mouth at all times even when staff is not in the room. Pt transfers to sitting EOB with supervision & cuing to square hips up to place BLE flat on floor. Pt's husband provided new tennis shoes & PT donned them with AFO. Stand pivot to L with min assist. Educated pt's husband on what pt is working on in therapy & current deficits. In gym, pt transfers sit<>stand with min assist with cuing for hand placement. Gait x 100 ft with RW & min assist with pt demonstrating much improved RLE swing phase and foot clearance during this session. Pt attempts to perform standing RLE hip flexion but unable. Sitting in w/c pt performs RLE long arc quads (2/5 MMT) for RLE quad strengthening & NMR. Pt unable to flex knee to place foot back underneath her, still requiring UE assist. At end of session pt left in w/c with chair alarm donned, call bell & all needs in reach, husband present in room.  Therapy Documentation Precautions:  Precautions Precautions: Fall Precaution Comments: R hemiplegia Restrictions Weight Bearing Restrictions: No   Therapy/Group: Individual Therapy  Sandi Mariscal 08/01/2020, 3:40 PM

## 2020-08-01 NOTE — Progress Notes (Signed)
[place] PHYSICAL MEDICINE & REHABILITATION PROGRESS NOTE   Subjective/Complaints:  No new complaints. Sitting up in bed about to work with SLP  ROS: Patient denies fever, rash, sore throat, blurred vision, nausea, vomiting, diarrhea, cough, shortness of breath or chest pain, joint or back pain, headache, or mood change.   Objective:   No results found. Recent Labs    08/01/20 0449  WBC 6.6  HGB 12.3  HCT 39.3  PLT 354   Recent Labs    08/01/20 0449  NA 137  K 3.8  CL 104  CO2 22  GLUCOSE 107*  BUN 18  CREATININE 0.88  CALCIUM 9.5    Intake/Output Summary (Last 24 hours) at 08/01/2020 1409 Last data filed at 08/01/2020 0718 Gross per 24 hour  Intake 118 ml  Output --  Net 118 ml     Physical Exam: Vital Signs Blood pressure 122/75, pulse 80, temperature 98.3 F (36.8 C), resp. rate 15, height 5\' 3"  (1.6 m), weight 90 kg, SpO2 99 %.   Constitutional: No distress . Vital signs reviewed. HEENT: EOMI, oral membranes moist Neck: supple Cardiovascular: RRR without murmur. No JVD    Respiratory/Chest: CTA Bilaterally without wheezes or rales. Normal effort    GI/Abdomen: BS +, non-tender, non-distended Ext: no clubbing, cyanosis, or edema Psych: pleasant and cooperative Neurological:     Mental Status: She is alert.     Comments: oriented x3. Apraxic language but can speak in short sentences with extra time. Doesn't paraphrase.   Motor: Right hemiplegia RUE 3/5. RLE tr-1/5 Left upper extremity: 4-/5 proximal distal  Left lower extremity: 4+/5 proximal to distal  Musc: mild right hip tenderness with ROM     Assessment/Plan: 1. Functional deficits secondary to acute L ACA stroke which require 3+ hours per day of interdisciplinary therapy in a comprehensive inpatient rehab setting.  Physiatrist is providing close team supervision and 24 hour management of active medical problems listed below.  Physiatrist and rehab team continue to assess barriers to  discharge/monitor patient progress toward functional and medical goals  Care Tool:  Bathing    Body parts bathed by patient: Chest, Abdomen, Front perineal area, Right upper leg, Left upper leg, Face, Left arm, Right arm, Buttocks, Left lower leg, Right lower leg   Body parts bathed by helper: Right arm, Left arm, Buttocks, Right lower leg, Left lower leg     Bathing assist Assist Level: Minimal Assistance - Patient > 75%     Upper Body Dressing/Undressing Upper body dressing   What is the patient wearing?: Dress    Upper body assist Assist Level: Minimal Assistance - Patient > 75%    Lower Body Dressing/Undressing Lower body dressing      What is the patient wearing?: Underwear/pull up, Pants     Lower body assist Assist for lower body dressing: Moderate Assistance - Patient 50 - 74%     Toileting Toileting    Toileting assist Assist for toileting: Minimal Assistance - Patient > 75%     Transfers Chair/bed transfer  Transfers assist     Chair/bed transfer assist level: Minimal Assistance - Patient > 75%     Locomotion Ambulation   Ambulation assist      Assist level: Minimal Assistance - Patient > 75% Assistive device: Walker-rolling Max distance: 70 ft   Walk 10 feet activity   Assist     Assist level: Minimal Assistance - Patient > 75% Assistive device: Walker-rolling   Walk 50 feet activity  Assist Walk 50 feet with 2 turns activity did not occur: Safety/medical concerns  Assist level: Minimal Assistance - Patient > 75% Assistive device: Walker-rolling    Walk 150 feet activity   Assist Walk 150 feet activity did not occur: Safety/medical concerns         Walk 10 feet on uneven surface  activity   Assist Walk 10 feet on uneven surfaces activity did not occur: Safety/medical concerns         Wheelchair     Assist Will patient use wheelchair at discharge?: No             Wheelchair 50 feet with 2 turns  activity    Assist            Wheelchair 150 feet activity     Assist          Blood pressure 122/75, pulse 80, temperature 98.3 F (36.8 C), resp. rate 15, height 5\' 3"  (1.6 m), weight 90 kg, SpO2 99 %.    Medical Problem List and Plan: 1. 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              -patient may shower             -ELOS/Goals:  8/19/Min A            Continue CIR making gains in language, initiation.    -AFO trial RLE,   PRAFO 2.  Antithrombotics: -DVT/anticoagulation:  Pharmaceutical: Lovenox             -antiplatelet therapy: On ASA, Plavix started for 6 months.  3. Pain Management: ?mild right hip contusion d/t recent fall. Pain seems minimal  -continue ice/local care  -activity as tolerated 4. Mood: LCSW to follow for evaluation and support.              -antipsychotic agents: N/A 5. Neuropsych: This patient maybe capable of making decisions on her own behalf. 6. Skin/Wound Care: Routine pressure relief measures.  7. Fluids/Electrolytes/Nutrition: Monitor I/Os. Electrolytes stable 8. HTN: Norvasc, Bystolic.   Vitals:   07/31/20 1929 08/01/20 0424  BP: 130/67 122/75  Pulse: 85 80  Resp: 16 15  Temp: (!) 97.5 F (36.4 C) 98.3 F (36.8 C)  SpO2: 96% 99%  Controlled 8/9 9. GERD: On Protonix.  10. Hyperlipidemia: Trig-1549-->330 11. Hypokalemia:              Stable on 8/2 12. Stress induced hyperglycemia: Hgb A1c-5.6.              resolved 13: Neurogenic bladder: On Bactrim x5 days for UTI completed 8/5 14. Constipation: On senna      LOS: 12 days A FACE TO FACE EVALUATION WAS PERFORMED  10/01/20 08/01/2020, 2:09 PM

## 2020-08-02 ENCOUNTER — Inpatient Hospital Stay (HOSPITAL_COMMUNITY): Payer: BC Managed Care – PPO | Admitting: Occupational Therapy

## 2020-08-02 ENCOUNTER — Inpatient Hospital Stay (HOSPITAL_COMMUNITY): Payer: BC Managed Care – PPO

## 2020-08-02 ENCOUNTER — Inpatient Hospital Stay (HOSPITAL_COMMUNITY): Payer: BC Managed Care – PPO | Admitting: Physical Therapy

## 2020-08-02 NOTE — Progress Notes (Signed)
Physical Therapy Weekly Progress Note  Patient Details  Name: Tanya Lopez MRN: [id number] Date of Birth: 1971-02-22  Beginning of progress report period: July 27, 2020 End of progress report period: August 02, 2020  Today's Date: 08/02/2020 PT Individual Time: 3428-7681 PT Individual Time Calculation (min): 70 min   Patient has met 3 of 3 short term goals.  Pt is making steady progress towards LTG's. Pt can currently ambulate with RW & min assist with R AFO. PT plans to schedule AFO consult with [facility] orthotist prior to d/c. Pt currently requires min assist for stair negotiation with B rails but has no rails at home so will need to practice stair negotiation backwards with RW. Pt continues to demonstrate impaired safety awareness & R inattention. Pt would benefit from continued skilled PT treatment to focus on the deficits noted above/below to increase independence prior to d/c, and will need caregiver training prior to d/c.   Patient continues to demonstrate the following deficits muscle weakness, decreased cardiorespiratoy endurance, decreased coordination, decreased visual perceptual skills, decreased attention to right, decreased attention, decreased awareness, decreased problem solving, decreased safety awareness, decreased memory and delayed processing, and decreased standing balance, decreased postural control, decreased balance strategies and R hemiparesis and therefore will continue to benefit from skilled PT intervention to increase functional independence with mobility.  Patient progressing toward long term goals..  Continue plan of care.  PT Short Term Goals Week 2:  PT Short Term Goal 1 (Week 2): Pt will ambulate 50 ft with LRAD & min assist. PT Short Term Goal 1 - Progress (Week 2): Met PT Short Term Goal 2 (Week 2): Pt will complete car transfer with LRAD & min assist. PT Short Term Goal 2 - Progress (Week 2): Met PT Short Term Goal 3 (Week 2): Pt will negotiate 4  steps with mod assist. PT Short Term Goal 3 - Progress (Week 2): Met Week 3:  PT Short Term Goal 1 (Week 3): STG = LTG due to anticipated d/c date.  Skilled Therapeutic Interventions/Progress Updates:  Pt received in bed with husband present but exiting session. Supine>sit with supervision & PT assists with donning B shoes & R AFO. Stand pivots throughout session without AD & min assist with min cuing for hand placement. Pt completes car transfer at sedan simulated height with RW & min assist with cuing to sit then place BLE in/out of car & min assist to elevate RLE into car. Pt negotiates 4 steps (6") with B rails & min assist with cuing for compensatory pattern. Gait x 10 ft + 20 ft + 100 ft without AD & min assist with focus on high level balance training, weight shifting & R LE NMR. Pt demonstrates slightly excessive weight shifting L to allow for compensatory pattern to advance RLE 2/2 hip flexor weakness. Pt transferred supine<>sitting on mat table with supervision. In L sidelying pt performs RLE hip flexion with powder board with focus on strengthening (pt requires max assist to extend R hip to return to starting position). Nu-step on level 4 x 10 minutes with 1 rest break halfway through; pt utilizes all four extremities with focus on global strengthening & endurance training, coordination of reciprocal movements, and RLE hip/knee extension. At end of session pt left in w/c with chair alarm donned, call bell in reach, telesitter in room.  PT completed memory notebook entry but pt able to recall all activities from session with only min cuing & extra time.  Therapy Documentation Precautions:  Precautions Precautions: Fall Precaution Comments: R hemiplegia Restrictions Weight Bearing Restrictions: No  Pain: Denies pain  Therapy/Group: Individual Therapy  Waunita Schooner 08/02/2020, 2:19 PM

## 2020-08-02 NOTE — Patient Care Conference (Signed)
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 Faith Rogue Care Coodinator Present: Cecile Sheerer, LCSWA;Ryver Zadrozny Marlyne Beards, RN, BSN, CRRN Nurse Present: Other (comment) Dava Najjar McGloster, RN) PT Present: Aleda Grana, PT OT Present: Kearney Hard, OT SLP Present: Colin Benton, SLP PPS Coordinator present : Edson Snowball, Park Breed, SLP     Current Status/Progress Goal Weekly Team Focus  Bowel/Bladder   Continent  Will remain continent of B&B  Assess pt incontinence q shift and needed   Swallow/Nutrition/ Hydration             ADL's   Min A overall  Supervision/CGA  R attention, self-care retrainig, activity tolerance, balance, R UE NMR   Mobility   min assist bed mobilty & transfers, min assist gait x max of 70 ft with RW, improving RUE/slight RLE activation  supervision<>Min assist overall  gait, transfers, bed mobility, strengthening, endurance, pt education, awareness/cognition, R NMR   Communication   Min A phrase  Supervision  verbal expression at sentence level and word finding   Safety/Cognition/ Behavioral Observations  Min A  Supervision  safety awareness and error awareness   Pain   No c/o pain  Pt will remain pain free while at the hospital  Assess  pt for pain q shift and as needed.   Skin   Intact no rash or breakdown  SKin will remain intact or prevent breakdown while at the hospital  Assess skin q shift or as needed.     Discharge Planning:  Pt to discharge home with husband. Husband to provide care at discharge.   Team Discussion: Pt remains incontinent, timed toilet q 4 hr. Poor safety awareness to right side. Averages 3 word phrases, error awareness with words and phrases is good but correction still needs improvement. Patient on target to meet rehab goals: yes  *See Care Plan and progress notes for long and short-term goals.   Revisions to Treatment Plan:  None  Teaching Needs: Family education as recommended by therapy.  Current Barriers to Discharge: Inaccessible home environment and Home enviroment access/layout  Possible Resolutions to Barriers: Stairs to enter the home have no railing and no ramp is available.     Medical Summary Current Status: improved language and processing. bp controlled. eating well.  Barriers to Discharge: Medical stability   Possible Resolutions to [employer] Focus: daily med management. bp control.   Continued Need for Acute Rehabilitation Level of Care: The patient requires daily medical management by a physician with specialized training in physical medicine and rehabilitation for the following reasons: Direction of a multidisciplinary physical rehabilitation program to maximize functional independence : Yes Medical management of patient stability for increased activity during participation in an intensive rehabilitation regime.: Yes Analysis of laboratory values and/or radiology reports  with any subsequent need for medication adjustment and/or medical intervention. : Yes   I attest that I was present, lead the team conference, and concur with the assessment and plan of the team.   Tennis Must 08/02/2020, 3:48 PM

## 2020-08-02 NOTE — Progress Notes (Signed)
Speech Language Pathology Daily Session Note  Patient Details  Name: Tanya Lopez MRN: [id number] Date of Birth: 1971/12/21  Today's Date: 08/02/2020 SLP Individual Time: 0901-1000 SLP Individual Time Calculation (min): 59 min  Short Term Goals: Week 2: SLP Short Term Goal 1 (Week 2): Pt will express wants/needs at phrase level with supervision level multimodal cues. SLP Short Term Goal 2 (Week 2): Pt will increase verbal output to sentence level in structured tasks with supervision level semantic cues. SLP Short Term Goal 3 (Week 2): Pt will utilize word finding strategies in structured tasks with supervision level verbal cues.  Skilled Therapeutic Interventions: Skilled ST services focused on cognitive and language skills. SLP facilitated spontaneous language utilizing "talking points" notebook to recall events from yesterday, pt produced primarily 3 word phrases. Pt demonstrated ability to expand phrases from 3 words to 5 words with mod A fade to min A verbal cues (1/3 through task) utilizing categorization when creating definitions of common items. SLP planned to target complex problem solving and emergent awareness in personal medication task, however pill organizer was being utilized by another SLP. Pt demonstrated ability to read medication list mod I and verbal problem solving pertaining to medication consumed various times per day with supervision A verbal cues. SLP also facilitated complex problem solving and emergent awareness in account balancing task, pt demonstrated appropriate problem solving with pen/paper as well as calculators calculations, however required mod A verbal cues to correct functional errors. Pt was left in room with call bell within reach and chair alarm set. ST recommends to continue skilled ST services. SLP added emergent awareness goal and completed complex problem solving goal, since it appears it was entered in error per chart review.      Pain Pain  Assessment Pain Scale: 0-10 Pain Score: 0-No pain  Therapy/Group: Individual Therapy  Meda Dudzinski  [facility] 08/02/2020, 10:52 AM

## 2020-08-02 NOTE — Plan of Care (Signed)
  Problem: RH Problem Solving Goal: LTG Patient will demonstrate problem solving for (SLP) Description: LTG:  Patient will demonstrate problem solving for basic/complex daily situations with cues  (SLP) Outcome: Completed/Met Flowsheets (Taken 08/02/2020 1050) LTG: Patient will demonstrate problem solving for (SLP): (added in error) -- Note: Add in error    Problem: RH Awareness Goal: LTG: Patient will demonstrate awareness during functional activites type of (SLP) Description: LTG: Patient will demonstrate awareness during functional activites type of (SLP) Flowsheets (Taken 08/02/2020 1049) Patient will demonstrate during cognitive/linguistic activities awareness type of: (and safety awareness) Emergent LTG: Patient will demonstrate awareness during cognitive/linguistic activities with assistance of (SLP): Supervision

## 2020-08-02 NOTE — Progress Notes (Signed)
Occupational Therapy Session Note  Patient Details  Name: Tanya Lopez MRN: [id number] Date of Birth: Jun 03, 1971  Today's Date: 08/02/2020 OT Individual Time: 8453-6468 OT Individual Time Calculation (min): 57 min   Short Term Goals: Week 2:  OT Short Term Goal 1 (Week 2): Pt will dress R UE first with mod questioning cues OT Short Term Goal 2 (Week 2): Pt will open container using R hand with mod questioning cues OT Short Term Goal 3 (Week 2): Pt will use B UEs to pull up pants in standing  Skilled Therapeutic Interventions/Progress Updates:    Pt greeted sitting in circle sitting in bed and agreeable to OT. Pt declined to shower today, but wanted to get dressed. Pt came to sitting EOB with increased time and supervision. Pt already had underwear on and stated she did not need to change them or go to the bathroom. Pt able to thread R LE into shorts today witthout cues for hemi-techniques. Sit<>stand from EOB with R knee block and min A without AD. Pt then donned shirt with set-up A and min cues to attend to bring R UE all the way through shirt sleeve. Pt able to don socks in figure 4 position, then she needed assist from OT to don R shoe and AFO. B UE coordination with shoe tying task. Pt completed stand-pivot to wc with min A. Functional use of R UE to open containers and brush teeth with R hand. Pt brought down to therapy gym and completed 5 mins x2 of UE Ergometer. Dynavision activity using only R hand crossing midline and hitting all 4 quadrants. Pt returned to room and left seated in wc with alarm belt on, call bell in reach, and needs met.    Therapy Documentation Precautions:  Precautions Precautions: Fall Precaution Comments: R hemiplegia Restrictions Weight Bearing Restrictions: No Pain:  denies pain  Therapy/Group: Individual Therapy  Valma Cava 08/02/2020, 8:04 AM

## 2020-08-02 NOTE — Progress Notes (Signed)
[place] PHYSICAL MEDICINE & REHABILITATION PROGRESS NOTE   Subjective/Complaints:  Up with nurse in room. Going to bathroom. No new complaints this morning. Still impulsive at times.   ROS: limited due to language/communication    Objective:   No results found. Recent Labs    08/01/20 0449  WBC 6.6  HGB 12.3  HCT 39.3  PLT 354   Recent Labs    08/01/20 0449  NA 137  K 3.8  CL 104  CO2 22  GLUCOSE 107*  BUN 18  CREATININE 0.88  CALCIUM 9.5    Intake/Output Summary (Last 24 hours) at 08/02/2020 1222 Last data filed at 08/01/2020 1900 Gross per 24 hour  Intake 118 ml  Output --  Net 118 ml     Physical Exam: Vital Signs Blood pressure 117/67, pulse 79, temperature 98.4 F (36.9 C), temperature source Oral, resp. rate 16, height 5\' 3"  (1.6 m), weight 90 kg, SpO2 99 %.   Constitutional: No distress . Vital signs reviewed. HEENT: EOMI, oral membranes moist Neck: supple Cardiovascular: RRR without murmur. No JVD    Respiratory/Chest: CTA Bilaterally without wheezes or rales. Normal effort    GI/Abdomen: BS +, non-tender, non-distended Ext: no clubbing, cyanosis, or edema Psych: pleasant and cooperative, more engaging Neurological:     Mental Status: She is alert.     Comments: oriented x3. Apraxic language but can speak in short sentences with extra time. Motor: Right hemiplegia RUE 3/5. RLE tr-1/5 Left upper extremity: 4-/5 proximal distal  Left lower extremity: 4+/5 proximal to distal  Musc: mild right hip tenderness with ROM     Assessment/Plan: 1. Functional deficits secondary to acute L ACA stroke which require 3+ hours per day of interdisciplinary therapy in a comprehensive inpatient rehab setting.  Physiatrist is providing close team supervision and 24 hour management of active medical problems listed below.  Physiatrist and rehab team continue to assess barriers to discharge/monitor patient progress toward functional and medical goals  Care  Tool:  Bathing    Body parts bathed by patient: Chest, Abdomen, Front perineal area, Right upper leg, Left upper leg, Face, Left arm, Right arm, Buttocks, Left lower leg, Right lower leg   Body parts bathed by helper: Right arm, Left arm, Buttocks, Right lower leg, Left lower leg     Bathing assist Assist Level: Minimal Assistance - Patient > 75%     Upper Body Dressing/Undressing Upper body dressing   What is the patient wearing?: Dress    Upper body assist Assist Level: Minimal Assistance - Patient > 75%    Lower Body Dressing/Undressing Lower body dressing      What is the patient wearing?: Underwear/pull up, Pants     Lower body assist Assist for lower body dressing: Moderate Assistance - Patient 50 - 74%     Toileting Toileting    Toileting assist Assist for toileting: Minimal Assistance - Patient > 75%     Transfers Chair/bed transfer  Transfers assist     Chair/bed transfer assist level: Minimal Assistance - Patient > 75%     Locomotion Ambulation   Ambulation assist      Assist level: Minimal Assistance - Patient > 75% Assistive device: Walker-rolling Max distance: 100 ft   Walk 10 feet activity   Assist     Assist level: Minimal Assistance - Patient > 75% Assistive device: Walker-rolling   Walk 50 feet activity   Assist Walk 50 feet with 2 turns activity did not occur: Safety/medical concerns  Assist level: Minimal Assistance - Patient > 75% Assistive device: Walker-rolling    Walk 150 feet activity   Assist Walk 150 feet activity did not occur: Safety/medical concerns         Walk 10 feet on uneven surface  activity   Assist Walk 10 feet on uneven surfaces activity did not occur: Safety/medical concerns         Wheelchair     Assist Will patient use wheelchair at discharge?: No             Wheelchair 50 feet with 2 turns activity    Assist            Wheelchair 150 feet activity      Assist          Blood pressure 117/67, pulse 79, temperature 98.4 F (36.9 C), temperature source Oral, resp. rate 16, height 5\' 3"  (1.6 m), weight 90 kg, SpO2 99 %.    Medical Problem List and Plan: 1. 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              -patient may shower             -ELOS/Goals:  8/19/Min A            -team conf today. Spoke with father this morning who is a physiatrist about her status and prognosis.     -AFO RLE,   PRAFO 2.  Antithrombotics: -DVT/anticoagulation:  Pharmaceutical: Lovenox             -antiplatelet therapy: On ASA, Plavix started for 6 months.  3. Pain Management: ?mild right hip contusion d/t recent fall. Pain seems minimal  -continue ice/local care  -activity as tolerated 4. Mood: LCSW to follow for evaluation and support.              -antipsychotic agents: N/A 5. Neuropsych: This patient maybe capable of making decisions on her own behalf. 6. Skin/Wound Care: Routine pressure relief measures.  7. Fluids/Electrolytes/Nutrition: Monitor I/Os. Electrolytes stable 8. HTN: Norvasc, Bystolic.   Vitals:   08/01/20 1924 08/02/20 0335  BP: 134/79 117/67  Pulse: 89 79  Resp: 15 16  Temp: 98.7 F (37.1 C) 98.4 F (36.9 C)  SpO2: 98% 99%  Controlled 8/10 9. GERD: On Protonix.  10. Hyperlipidemia: Trig-1549-->330 11. Hypokalemia:              Stable on 8/2, 8/10 12. Stress induced hyperglycemia: Hgb A1c-5.6.              resolved 13: Neurogenic bladder: On Bactrim x5 days for UTI completed 8/5 14. Constipation: On senna, moved bowels yesterday      LOS: 13 days A FACE TO FACE EVALUATION WAS PERFORMED  10/02/20 08/02/2020, 12:22 PM

## 2020-08-02 NOTE — Progress Notes (Signed)
Patient ID: Tanya Lopez, female   DOB: [birth year], 49 y.o.   MRN: [id number]  SW met with pt and pt husband in room to provide updates from team conference, and d/c date remains 8/19. Fam edu scheduled for 8/13 and 8/16 with pt husband already. SW informed will provide updates once there are d/c recs available.   Loralee Pacas, MSW, [place] Office: [phone] Cell: [phone] Fax: [phone]

## 2020-08-03 ENCOUNTER — Inpatient Hospital Stay (HOSPITAL_COMMUNITY): Payer: BC Managed Care – PPO | Admitting: *Deleted

## 2020-08-03 ENCOUNTER — Inpatient Hospital Stay (HOSPITAL_COMMUNITY): Payer: BC Managed Care – PPO | Admitting: Physical Therapy

## 2020-08-03 ENCOUNTER — Inpatient Hospital Stay (HOSPITAL_COMMUNITY): Payer: BC Managed Care – PPO

## 2020-08-03 ENCOUNTER — Inpatient Hospital Stay (HOSPITAL_COMMUNITY): Payer: BC Managed Care – PPO | Admitting: Occupational Therapy

## 2020-08-03 NOTE — Progress Notes (Signed)
Physical Therapy Session Note  Patient Details  Name: Tanya Lopez MRN: [id number] Date of Birth: 08-26-1971  Today's Date: 08/03/2020 PT Individual Time: 0277-4128 and 7867-6720 PT Individual Time Calculation (min): 72 min and 29 min  Short Term Goals: Week 3:  PT Short Term Goal 1 (Week 3): STG = LTG due to anticipated d/c date.  Skilled Therapeutic Interventions/Progress Updates:  Treatment 1: Pt received in bed & agreeable to tx. Pt comes to sitting EOB with supervision with cuing to attempt to move RLE without UE assist. PT dons B shoes & R AFO total assist for time management. Stand pivot without AD with min assist, sit>stand with CGA with min cuing for hand placement. Gait x 200 ft with RW & CGA, except min assist when turning to sit in w/c with pt demonstrating good RLE swing phase but still poor foot clearance but pt aware when foot catches on floor. Pt requires min cuing for forward vs downward gaze & pt reports 9/10 fatigue after gait. PT provides demo for stair negotiation backwards with RW then pt performs task with min assist (3 steps, 6") with PT providing max cuing for sequencing & technique & pt requiring extra time to place RLE in proper position on step. Retrograde gait x 10 ft + 11 ft with RW &  min assist with focus on RLE hip extension strengthening to carryover to backwards stair negotiation. Kinetron, seated, with RLE only & PT operating other pedal with focus on R hip extensor strengthening. Back in room, pt returns to bed, sit>supine with supervision & min cuing to attend to RLE. Pt performs bridging, bridging with adductor hold, RLE single leg bridging, and RLE heel slides with PT providing min assist at RLE to maintain neutral alignment vs abducting (during bridges) with max verbal cuing for technique with task focusing on RLE glute/hip strengthening. At end of session pt left in bed with alarm set, call bell in reach. PT completed memory notebook entry with pt  recalling events of session with only min cuing. Pain: denies pain  Treatment 2: Pt received in w/c with husband present but exiting session. PT provides assistance for donning shorts for time management. In gym, pt transfers w/c<>tall kneeling on EOM with min assist. Pt maintains tall kneeling without BUE support with CGA, then with BUE support performs hip flexion>extension exercise with focus on glute squeeze for glute/hamstring strengthening. Pt then performs tall kneeling x 3 minutes with CGA while engaging in clothespin task with RUE for additional R hand NMR, fine motor control & strengthening. At end of session pt left in w/c with chair alarm donned, call bell & all needs in reach, telesitter in room. Pain: denies pain  Therapy Documentation Precautions:  Precautions Precautions: Fall Precaution Comments: R hemiplegia Restrictions Weight Bearing Restrictions: No   Therapy/Group: Individual Therapy  Sandi Mariscal 08/03/2020, 1:22 PM

## 2020-08-03 NOTE — Plan of Care (Signed)
  Problem: Consults °Goal: RH STROKE PATIENT EDUCATION °Description: See Patient Education module for education specifics  °Outcome: Progressing °  °Problem: RH BOWEL ELIMINATION °Goal: RH STG MANAGE BOWEL WITH ASSISTANCE °Description: STG Manage Bowel with min Assistance. °Outcome: Progressing °Goal: RH STG MANAGE BOWEL W/MEDICATION W/ASSISTANCE °Description: STG Manage Bowel with Medication with min Assistance. °Outcome: Progressing °  °Problem: RH SKIN INTEGRITY °Goal: RH STG SKIN FREE OF INFECTION/BREAKDOWN °Description: Skin to remain free from breakdown with min assist while on rehab. °Outcome: Progressing °  °Problem: RH PAIN MANAGEMENT °Goal: RH STG PAIN MANAGED AT OR BELOW PT'S PAIN GOAL °Description: <4 on a 0-10 pain scale. °Outcome: Progressing °  °Problem: RH SAFETY °Goal: RH STG ADHERE TO SAFETY PRECAUTIONS W/ASSISTANCE/DEVICE °Description: STG Adhere to Safety Precautions With min Assistance and appropriate assistive Device. °Outcome: Progressing °  °

## 2020-08-03 NOTE — Progress Notes (Signed)
Occupational Therapy Session Note  Patient Details  Name: Tanya Lopez MRN: [id number] Date of Birth: [birth year]  Today's Date: 08/03/2020 OT Individual Time: 1002-1057 OT Individual Time Calculation (min): 55 min    Short Term Goals: Week 1:  OT Short Term Goal 1 (Week 1): Pt will complete toilet transfer with mod A of 1 OT Short Term Goal 1 - Progress (Week 1): Met OT Short Term Goal 2 (Week 1): Pt will maintain standing balance within bADL task with mod A OT Short Term Goal 2 - Progress (Week 1): Met OT Short Term Goal 3 (Week 1): Pt will complete 1 step of LB dressing task OT Short Term Goal 3 - Progress (Week 1): Met  Skilled Therapeutic Interventions/Progress Updates:    Pt supine in bed, no pain, agreeable to OT.  Telesitter placed on privacy mode.  Pt completed supined to sit with increased time and SBA.  Pt transported to walk in shower using stedy requiring CGA for sit<>stand from EOB and to TTB.  Pt completed UB bathing and dressing with setup.  LB bathing and doffing pants, underwear and socks completed with CGA.  Pt transported back to EOB needing CGA and increased time sit to stand from TTB and stand to sit at EOB.  Pt donned dress with setup.  Incontinence brief donned with min assist for tab mgt and to lift RLE during threading.  Pt donned socks with setup and increased time.  Left shoe donned with supervision and right shoe with AFO donned with mod assist.  Pt completed SPT at RW with CGA EOB to w/c.  Transported to sinkside where she completed standing oral hygiene and combed hair with CGA.  Pt initially using left hand to complete needing VCs to encourage right dominant hand use.  Pt completed right hand pinching to retrieve small beads out of putty and gross gripping to strengthen.  Intermittent VCs needed to encourage right hand use due to pt compesnating with left hand.  Call bell in reach, seat belt alarm on, Telesitter on.  Therapy Documentation Precautions:   Precautions Precautions: Fall Precaution Comments: R hemiplegia Restrictions Weight Bearing Restrictions: No   Therapy/Group: Individual Therapy  Ezekiel Slocumb 08/03/2020, 11:26 AM

## 2020-08-03 NOTE — Evaluation (Signed)
Recreational Therapy Assessment and Plan  Patient Details  Name: Tanya Lopez MRN: [id number] Date of Birth: 1971-08-22 Today's Date: 08/03/2020  Rehab Potential:   Good ELOS:   d.c 8/19  Assessment   Hospital Problem: Principal Problem:   Acute L ACA, R cerebellar, L temporoparietal infarcts ([place])  Active Problems:   Acute ischemic left MCA stroke [facility])   Past Medical History:      Past Medical History:  Diagnosis Date  . Hypertension   . PFO (patent foramen ovale) 07/19/2020   Noted on TEE 07/19/2020   Past Surgical History:       Past Surgical History:  Procedure Laterality Date  . IR ANGIO INTRA EXTRACRAN SEL COM CAROTID INNOMINATE BILAT MOD SED  07/17/2020  . IR ANGIO VERTEBRAL SEL VERTEBRAL BILAT MOD SED  07/17/2020  . IR CT HEAD LTD  07/17/2020  . RADIOLOGY WITH ANESTHESIA N/A 07/16/2020   Procedure: IR WITH ANESTHESIA;  Surgeon: Luanne Bras, MD;  Location: [facility];  Service: Radiology;  Laterality: N/A;  . TEE WITHOUT CARDIOVERSION N/A 07/19/2020   Procedure: TRANSESOPHAGEAL ECHOCARDIOGRAM (TEE);  Surgeon: Skeet Latch, MD;  Location: [facility] ENDOSCOPY;  Service: Cardiovascular;  Laterality: N/A;    Assessment & Plan Clinical Impression: Patient is a 48 y.o. year old female with recent admission to the hospital on 07/16/2020 with acute onset of left-sided weakness and aphasia. Cranial CT scan showed focal subarachnoid hemorrhage in the medial posterior left frontal lobe. CT angiogram of head and neck showed left A2 segment occlusion with poor reconstitution of distal branch vessels. 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 Patient did not receive TPA. Admission chemistries unremarkable, urinalysis negative nitrite, lactic acid 2.5. Cerebral angiogram completed showing smooth areas of narrowing of left ACA A2and branches of A2 suspicious for possible dissection but no cortical  thrombosis. Plan currently is to follow-up with interventional radiology 1 month for repeat CTA of head and neck. Patient did initially require ventilatory support and extubated 07/17/2020. Lower extremity Dopplers negative for DVT. 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 Small acute right cerebellar left occipital and right thalamic infarct. Motion degraded head MRA with apparent improved left A2 segment flow compared to prior CTA. Maintained on Cardene for blood pressure control. Echocardiogram with ejection fraction of 75% no wall motion abnormalities. Patient currently maintained on aspirin 325 mg daily for CVA prophylaxis. Subcutaneous heparin for DVT prophylaxis.TEE 7/27 positive PFO with right to left shunt at rest.  Patient transferred to CIR on 07/20/2020 .    Pt presents with decreased activity tolerance, decreased functional mobility, decreased balance, d decreased coordination}, decreased attention, decreased awareness, decreased safety awareness and delayed processing Limiting pt's independence with leisure/community pursuits.   Plan Min 1 TR session >20 minutes during LOS  Recommendations for other services: None   Discharge Criteria: Patient will be discharged from TR if patient refuses treatment 3 consecutive times without medical reason.  If treatment goals not met, if there is a change in medical status, if patient makes no progress towards goals or if patient is discharged from hospital.  The above assessment, treatment plan, treatment alternatives and goals were discussed and mutually agreed upon: by patient  [place] 08/03/2020, 3:08 PM

## 2020-08-03 NOTE — Progress Notes (Signed)
Speech Language Pathology Weekly Progress and Session Note  Patient Details  Name: Tanya Lopez MRN: [id number] Date of Birth: 1971-07-14  Beginning of progress report period: July 28, 2020 End of progress report period: August 03, 2020  Today's Date: 08/03/2020 SLP Individual Time: 1445-1530 SLP Individual Time Calculation (min): 45 min  Short Term Goals: Week 2: SLP Short Term Goal 1 (Week 2): Pt will express wants/needs at phrase level with supervision level multimodal cues. SLP Short Term Goal 2 (Week 2): Pt will increase verbal output to sentence level in structured tasks with supervision level semantic cues. SLP Short Term Goal 2 - Progress (Week 2): Not met SLP Short Term Goal 3 (Week 2): Pt will utilize word finding strategies in structured tasks with supervision level verbal cues. SLP Short Term Goal 3 - Progress (Week 2): Not met    New Short Term Goals: Week 3: SLP Short Term Goal 1 (Week 3): STG=LTG due to short ELOS  Weekly Progress Updates:Pt made great progress meeting 3 out 3 goals this reporting period. SLP facilitated increasing verbal/written expression in structured and unstructured tasks, word finding strategies, complex problem solving and emergent awareness. SLP d/c complex problem solving LTG due to pt preforming mod I and added emergent/safety awareness LTG due to staff concern. Pt is able to express wants/needs at phrase level, averaging 3 words, but can expand utterance to 5 words in informal conversation and up to 12 word sentences in structured tasks. Pt continues to demonstrate increase verbal output in structured task verse unstructured tasks. Pt would continue to benefit from skills ST services in order to maximize functional independence and reduce burden of care, requiring 24 hour supervision and continued ST services.     Intensity: Minumum of 1-2 x/day, 30 to 90 minutes Frequency: 3 to 5 out of 7 days Duration/Length of Stay:  08/11/20 Treatment/Interventions: Cueing hierarchy;Speech/Language facilitation;Internal/external aids;Therapeutic Activities;Environmental controls;Functional tasks;Patient/family education   Daily Session  Skilled Therapeutic Interventions:  Skilled ST services focused on cognitive and language skills. SLP facilitated emergent awareness skills in complex problem solving tasks (scheudling and deductive reasoning), pt required supervision A verbal cues fade to mod I. cues. Pt demonstrated ability to name 9 animals and 4 words beginning with the letter "m" in divergent naming task, increasing to 10 animals in a minute following instruction in categorization and visualization.Pt was left in room with call bell within reach and bed alarm set. ST recommends to continue skilled ST services.     General    Pain Pain Assessment Pain Score: 0-No pain  Therapy/Group: Individual Therapy  Tanya Lopez  [facility] 08/03/2020, 3:58 PM

## 2020-08-04 ENCOUNTER — Inpatient Hospital Stay (HOSPITAL_COMMUNITY): Payer: BC Managed Care – PPO | Admitting: Occupational Therapy

## 2020-08-04 ENCOUNTER — Inpatient Hospital Stay (HOSPITAL_COMMUNITY): Payer: BC Managed Care – PPO | Admitting: Speech Pathology

## 2020-08-04 ENCOUNTER — Inpatient Hospital Stay (HOSPITAL_COMMUNITY): Payer: BC Managed Care – PPO

## 2020-08-04 NOTE — Progress Notes (Signed)
Recreational Therapy Session Note  Patient Details  Name: Tanya Lopez MRN: [id number] Date of Birth: January 14, 1971 Today's Date: 08/04/2020  Pain: no c/o Skilled Therapeutic Interventions/Progress Updates: Session focused on community mobility including ambulation and furniture transfers to various seating surfaces.  Pt ambulated outside on slightly uneven surfaces without AD ~30 with min assist.  Pt ambulated without AD inside atrium area with min assist and performed multiple furniture transfers including chair with armrest, chair without armrests, low surfaces and booth seating with contact guard assist.  Pt ambulated throughout atrium area navigating around table and chairs with min assist and then ambulated through the gift shop with contact guard assist.   Therapy/Group: Co-Treatment   Kaison Mcparland 08/04/2020, 2:37 PM

## 2020-08-04 NOTE — Progress Notes (Signed)
Physical Therapy Session Note  Patient Details  Name: Tanya Lopez MRN: [id number] Date of Birth: 01-31-1971  Today's Date: 08/04/2020 PT Individual Time: 9030-0923 PT Individual Time Calculation (min): 71 min   Short Term Goals: Week 3:  PT Short Term Goal 1 (Week 3): STG = LTG due to anticipated d/c date.  Skilled Therapeutic Interventions/Progress Updates:     Pt received supine in bed and agreeable to therapy. No report of pain. Supine to sit with supervision for positioning at EOB. PT provides maxA to don shoes and R AFO. Sit to stand with CGA and minA at hips for SPT to [facility] with cues for body mechanics and positioning. WC transport to gym for time management.  Pt performs NMR for standing balance with mirror positioned for visual feedback. Pt initially performs toe tapping with LLE on colored cones with PT blocking RLE. Pt then alternates and toe taps with RLE. PT provides verbal and tactile cues for lateral weight shifting and body mechanics to facilitate activity. Pt has occasional lateral LOB requiring minA but no buckling noted in R knee. Final progression to alternating BLE tapping dependent on therapist cuing.  Pt ambulates bouts of 50' without AD. PT provides minA at hips to facilitate weight shifting and stability. Pt "scuffs' R heel on ground during swing phase 75% of time. x5 bouts total with seated rest break.  Rec therapist present for final 30 minutes of session. WC transport outside and pt ambulates over concrete with minA for 80' Pt then ambulates in crowded environment performing multiple turns, and practices transferring from multiple surfaces, including chair, stool, and booth, for community reintegration. Pt then ambulates through gift shop with RW for practice with stimulatory environment with tight turns.  Pt performs SPT back to bed and sit to supine with supervision. Left supine in bed with alarm intact and all needs within reach.  Therapy  Documentation Precautions:  Precautions Precautions: Fall Precaution Comments: R hemiplegia Restrictions Weight Bearing Restrictions: No    Therapy/Group: Individual Therapy  Beau Fanny, PT, DPT 08/04/2020, 3:12 PM

## 2020-08-04 NOTE — Progress Notes (Signed)
Recreational Therapy Discharge Summary Patient Details  Name: Tanya Lopez MRN: [id number] Date of Birth: May 10, 1971 Today's Date: 08/04/2020  Long term goals set: 1  Long term goals met: 1  Comments on progress toward goals: TR session/education focused on activity/leisure analysis, activity modifications and community reintegration.   Pt participated in simulated community reintegration activity ambulatory level at overall min assist level.  Verbal cues needed for safety and awareness of RLE with turning and for foot drag.  Pt is scheduled for discharge home with family next week.  Goal met.  Reasons goals not met: n/a  Equipment acquired: n/a  Reasons for discharge: treatment goals met  Follow-up: Home Health  Patient/family agrees with progress made and goals achieved: Yes  Zeenat Jeanbaptiste 08/04/2020, 2:44 PM

## 2020-08-04 NOTE — Progress Notes (Signed)
Speech Language Pathology Daily Session Note  Patient Details  Name: Tanya Lopez MRN: [id number] Date of Birth: [birth year]  Today's Date: 08/04/2020 SLP Individual Time: 1100-1200 SLP Individual Time Calculation (min): 60 min  Short Term Goals: Week 3: SLP Short Term Goal 1 (Week 3): STG=LTG due to short ELOS SLP Short Term Goal 6 - Progress (Week 3): Not progressing  Skilled Therapeutic Interventions:   Patient seen by SLP for skilled therapeutic intervention addressing her expressive language abilities. Patient required only intermittent cues to expand to complete phrase/sentence when describing actions in photos and pictures. During structured tasks, she produced sentences of 7-10 word length. She completed divergent naming task and was able to name 8-10 items in a given category with minimal A. During structured conversation, patient had difficulty responding to open-ended questions and required moderate A with SLP providing semantic cues, partial phrase cues, choice cues. Overall, patient is more fluent with her expressive language during structured language tasks and at sentence level. She continues to benefit from skilled SLP intervention to address her speech-language abilities.   Pain Pain Assessment Pain Scale: 0-10 Faces Pain Scale: No hurt  Therapy/Group: Individual Therapy  Angela Nevin, MA, CCC-SLP 08/04/20 4:23 PM

## 2020-08-04 NOTE — Plan of Care (Signed)
  Problem: Consults °Goal: RH STROKE PATIENT EDUCATION °Description: See Patient Education module for education specifics  °Outcome: Progressing °  °Problem: RH BOWEL ELIMINATION °Goal: RH STG MANAGE BOWEL WITH ASSISTANCE °Description: STG Manage Bowel with min Assistance. °Outcome: Progressing °Goal: RH STG MANAGE BOWEL W/MEDICATION W/ASSISTANCE °Description: STG Manage Bowel with Medication with min Assistance. °Outcome: Progressing °  °Problem: RH SKIN INTEGRITY °Goal: RH STG SKIN FREE OF INFECTION/BREAKDOWN °Description: Skin to remain free from breakdown with min assist while on rehab. °Outcome: Progressing °  °Problem: RH PAIN MANAGEMENT °Goal: RH STG PAIN MANAGED AT OR BELOW PT'S PAIN GOAL °Description: <4 on a 0-10 pain scale. °Outcome: Progressing °  °Problem: RH SAFETY °Goal: RH STG ADHERE TO SAFETY PRECAUTIONS W/ASSISTANCE/DEVICE °Description: STG Adhere to Safety Precautions With min Assistance and appropriate assistive Device. °Outcome: Progressing °  °

## 2020-08-04 NOTE — Progress Notes (Signed)
Tanya Lopez PHYSICAL MEDICINE & REHABILITATION PROGRESS NOTE   Subjective/Complaints:  No complaints. Sitting up in bed. Ate most of her breakfast. AFO seems to be fitting her.   ROS: limited due to language/communication   Objective:   No results found. No results for input(s): WBC, HGB, HCT, PLT in the last 72 hours. No results for input(s): NA, K, CL, CO2, GLUCOSE, BUN, CREATININE, CALCIUM in the last 72 hours.  Intake/Output Summary (Last 24 hours) at 08/04/2020 1017 Last data filed at 08/04/2020 0730 Gross per 24 hour  Intake 177 ml  Output --  Net 177 ml     Physical Exam: Vital Signs Blood pressure 136/79, pulse 71, temperature 98.1 F (36.7 C), temperature source Oral, resp. rate 16, height 5\' 3"  (1.6 m), weight 90 kg, SpO2 99 %.   Constitutional: No distress . Vital signs reviewed. HEENT: EOMI, oral membranes moist Neck: supple Cardiovascular: RRR without murmur. No JVD    Respiratory/Chest: CTA Bilaterally without wheezes or rales. Normal effort    GI/Abdomen: BS +, non-tender, non-distended Ext: no clubbing, cyanosis, or edema Psych: pleasant and cooperative Neurological:     Mental Status: She is alert.     Comments: oriented x3 with extra time. Apraxic language but can speak in short sentences --no major changes today. Motor: Right hemiplegia RUE 3/5. RLE tr-1/5 Left upper extremity: 4-/5 proximal distal  Left lower extremity: 4+/5 proximal to distal  Musc: normal ROM     Assessment/Plan: 1. Functional deficits secondary to acute L ACA stroke which require 3+ hours per day of interdisciplinary therapy in a comprehensive inpatient rehab setting.  Physiatrist is providing close team supervision and 24 hour management of active medical problems listed below.  Physiatrist and rehab team continue to assess barriers to discharge/monitor patient progress toward functional and medical goals  Care Tool:  Bathing    Body parts bathed by patient: Chest,  Abdomen, Front perineal area, Right upper leg, Left upper leg, Face, Left arm, Right arm, Buttocks, Left lower leg, Right lower leg   Body parts bathed by helper: Right arm, Left arm, Buttocks, Right lower leg, Left lower leg     Bathing assist Assist Level: Contact Guard/Touching assist     Upper Body Dressing/Undressing Upper body dressing   What is the patient wearing?: Dress    Upper body assist Assist Level: Supervision/Verbal cueing    Lower Body Dressing/Undressing Lower body dressing      What is the patient wearing?: Underwear/pull up, Pants     Lower body assist Assist for lower body dressing: Minimal Assistance - Patient > 75%     Toileting Toileting    Toileting assist Assist for toileting: Minimal Assistance - Patient > 75%     Transfers Chair/bed transfer  Transfers assist     Chair/bed transfer assist level: Minimal Assistance - Patient > 75%     Locomotion Ambulation   Ambulation assist      Assist level: Contact Guard/Touching assist Assistive device: Walker-rolling Max distance: 200 ft   Walk 10 feet activity   Assist     Assist level: Contact Guard/Touching assist Assistive device: Walker-rolling   Walk 50 feet activity   Assist Walk 50 feet with 2 turns activity did not occur: Safety/medical concerns  Assist level: Contact Guard/Touching assist Assistive device: Walker-rolling    Walk 150 feet activity   Assist Walk 150 feet activity did not occur: Safety/medical concerns  Assist level: Contact Guard/Touching assist Assistive device: Walker-rolling    Walk  10 feet on uneven surface  activity   Assist Walk 10 feet on uneven surfaces activity did not occur: Safety/medical concerns         Wheelchair     Assist Will patient use wheelchair at discharge?: No             Wheelchair 50 feet with 2 turns activity    Assist            Wheelchair 150 feet activity     Assist           Blood pressure 136/79, pulse 71, temperature 98.1 F (36.7 C), temperature source Oral, resp. rate 16, height 5\' 3"  (1.6 m), weight 90 kg, SpO2 99 %.    Medical Problem List and Plan: 1. 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              -patient may shower             -ELOS/Goals:  8/19/Min A            -team conf today. Spoke with father this morning who is a physiatrist about her status and prognosis.     -AFO RLE,   PRAFO 2.  Antithrombotics: -DVT/anticoagulation:  Pharmaceutical: Lovenox             -antiplatelet therapy: On ASA, Plavix started for 6 months.  3. Pain Management: ?mild right hip contusion d/t recent fall. Pain seems minimal  -continue ice/local care  -activity as tolerated 4. Mood: LCSW to follow for evaluation and support.              -antipsychotic agents: N/A 5. Neuropsych: This patient maybe capable of making decisions on her own behalf. 6. Skin/Wound Care: Routine pressure relief measures.  7. Fluids/Electrolytes/Nutrition: Monitor I/Os. Electrolytes stable 8. HTN: Norvasc, Bystolic.   Vitals:   08/03/20 1954 08/04/20 0516  BP: 138/81 136/79  Pulse: 86 71  Resp: 18 16  Temp: 97.8 F (36.6 C) 98.1 F (36.7 C)  SpO2: 98% 99%  Controlled 8/12 9. GERD: On Protonix.  10. Hyperlipidemia: Trig-1549-->330 11. Hypokalemia:              Stable on 8/2, 8/10 12. Stress induced hyperglycemia: Hgb A1c-5.6.              resolved 13: Neurogenic bladder: On Bactrim x5 days for UTI completed 8/5 14. Constipation: On senna, moving bowels, eating well      LOS: 15 days A FACE TO FACE EVALUATION WAS PERFORMED  10/12 08/04/2020, 10:17 AM

## 2020-08-04 NOTE — Progress Notes (Signed)
Occupational Therapy Weekly Progress Note  Patient Details  Name: Tanya Lopez MRN: [id number] Date of Birth: [birth year]  Beginning of progress report period: July 21, 2020 End of progress report period: August 04, 2020  Today's Date: 08/04/2020 OT Individual Time: 5102-5852 OT Individual Time Calculation (min): 59 min    Patient has met 3 of 3 short term goals. Pt is making steady progress with OT treatments at this time.  She is able to complete all transfers with use of the RW and CGA/min A.  RUE function continues to improve to a supervision level for bathing and dressing tasks.  CGAfor sit to to stand with min instructional cueing for hand placement.  Min assist for stand pivot transfers without assistive device.  Will continue with current OT treatment POC.    Patient continues to demonstrate the following deficits: muscle weakness, unbalanced muscle activation, motor apraxia, decreased coordination and decreased motor planning, decreased attention to right, decreased attention, decreased awareness, decreased problem solving, decreased safety awareness and decreased memory and decreased standing balance, hemiplegia and decreased balance strategies and therefore will continue to benefit from skilled OT intervention to enhance overall performance with BADL.  Patient progressing toward long term goals..  Continue plan of care.  OT Short Term Goals Week 2:  OT Short Term Goal 1 (Week 2): Pt will dress R UE first with mod questioning cues OT Short Term Goal 1 - Progress (Week 2): Met OT Short Term Goal 2 (Week 2): Pt will open container using R hand with mod questioning cues OT Short Term Goal 2 - Progress (Week 2): Met OT Short Term Goal 3 (Week 2): Pt will use B UEs to pull up pants in standing OT Short Term Goal 3 - Progress (Week 2): Met Week 3:  OT Short Term Goal 1 (Week 3): LTG=STG 2/2 ELOS  Skilled Therapeutic Interventions/Progress Updates:    Pt greeted sitting on  commode with Stedy and nursing in with pt. Handoff to OT. OT removed Stedy and had pt practice completing peri-care which she was able to do with set-up A after continent void and BM. Pt then ambulated from commode to tub bench in shower without AFO, no AD, and Min A from OT. Bathing completed from tub bench with focus on R UE attention and NMR with min cues to use R UE for 50% of bathing tasks. Dressing completed wc at the sink with min for balance and verbal cues for forced use of R UE to help pull up pants. Worked on standing balance/endurance and B UE task with standing hair drying and toothburshing task. Overall CGA for balance and tolerated standing for ~ 5 minutes. Pt worked on R hand fine motor control with theraputty activity picking out small beads. Pt left seated in wc at end of session with chair alarm on, call bell in reach, and needs met.   Therapy Documentation Precautions:  Precautions Precautions: Fall Precaution Comments: R hemiplegia Restrictions Weight Bearing Restrictions: No Pain: Pain Assessment Pain Scale: 0-10 Pain Score: 0-No pain   Therapy/Group: Individual Therapy  Valma Cava 08/04/2020, 8:51 AM

## 2020-08-05 ENCOUNTER — Encounter (HOSPITAL_COMMUNITY): Payer: BC Managed Care – PPO | Admitting: Speech Pathology

## 2020-08-05 ENCOUNTER — Encounter (HOSPITAL_COMMUNITY): Payer: BC Managed Care – PPO | Admitting: Occupational Therapy

## 2020-08-05 ENCOUNTER — Ambulatory Visit (HOSPITAL_COMMUNITY): Payer: BC Managed Care – PPO | Admitting: Physical Therapy

## 2020-08-05 ENCOUNTER — Inpatient Hospital Stay (HOSPITAL_COMMUNITY): Payer: BC Managed Care – PPO | Admitting: Physical Therapy

## 2020-08-05 NOTE — Progress Notes (Signed)
Speech Language Pathology Daily Session Note  Patient Details  Name: Tanya Lopez MRN: [id number] Date of Birth: [birth year]  Today's Date: 08/05/2020 SLP Individual Time: 1030-1130 SLP Individual Time Calculation (min): 60 min  Short Term Goals: Week 3: SLP Short Term Goal 1 (Week 3): STG=LTG due to short ELOS SLP Short Term Goal 6 - Progress (Week 3): Not progressing  Skilled Therapeutic Interventions:   Patient seen to address expressive aphasia goals via skilled ST treatment as well as education with patient's spouse. SLP discussed overall progress, current level of function and recommendations for working with patient at home as well as recommendation for outpatient speech-language therapy upon discharge. Patient is currently at sentence/phrase level during structured tasks with context such as having a given topic and/or photo describing task and is able to express self verbally and in written form. When patient is asked open-ended questions without context (for example: What are 2-3 things that you are having difficulty with regarding your language expression?); patient was able to write "sponteniety" but then when writing second one, produced only a disjointed phrases without meaning. Patient required only intermittent to minimal cues to verbally and/or in written form, respond to questions related to topic or for describing pictures/photos. Initiation has improved during structured tasks but patient does not initiate to comment or request, or during semi-structured conversation. Patient continues to benefit from skilled ST services to maximize her expressive language abilities prior to discharge.   Pain Pain Assessment Pain Scale: 0-10 Pain Score: 0-No pain  Therapy/Group: Individual Therapy  Angela Nevin, MA, CCC-SLP Speech Therapy

## 2020-08-05 NOTE — Progress Notes (Signed)
Occupational Therapy Session Note  Patient Details  Name: Tanya Lopez MRN: [id number] Date of Birth: 05/28/1971  Today's Date: 08/05/2020 OT Individual Time: 3536-1443 OT Individual Time Calculation (min): 43 min    Short Term Goals: Week 3:  OT Short Term Goal 1 (Week 3): LTG=STG 2/2 ELOS  Skilled Therapeutic Interventions/Progress Updates:    Pt greeted in circle sitting in bed and agreeable to OT treatment session. Pt completed bed mobility with supervision, then stand-pivot to wc with CGA. Pt brought to the sink for bathing tasks. Pt wanted to wash lower body. Pt with some incontinence of urine in underwear, but stated she did not need to go to the bathroom. CGA sit<>stand and CGA for balance while washing peri-area and buttocks. Pt with verbal cues to integrate R UE into bathing tasks. Pt's husband entered room and OT educated on sit<>stands, standing balance, functional use of R UE, and R body awareness within BADL tasks. Educated on donning R AFO using shoe funnel and Spouse providing assistance. Practiced toilet transfers with pt and spouse with min A. Pt then ambulated in hallway with spouse and CGA. Pt left seated in wc with spouse present and needs met.   Therapy Documentation Precautions:  Precautions Precautions: Fall Precaution Comments: R hemiplegia Restrictions Weight Bearing Restrictions: No Pain: Pain Assessment Pain Scale: 0-10 Pain Score: 0-No pain   Therapy/Group: Individual Therapy  Valma Cava 08/05/2020, 9:34 AM

## 2020-08-05 NOTE — Progress Notes (Signed)
Physical Therapy Session Note  Patient Details  Name: Tanya Lopez MRN: [id number] Date of Birth: May 29, 1971  Today's Date: 08/05/2020 PT Individual Time: 3716-9678 and 1310-1419 PT Individual Time Calculation (min): 42 min and  69 min  Short Term Goals: Week 3:  PT Short Term Goal 1 (Week 3): STG = LTG due to anticipated d/c date.  Skilled Therapeutic Interventions/Progress Updates:  Treatment 1: Pt received in w/c with husband Fayrene Fearing) present for caregiver training. Educated them on pt's R inattention to body, impaired memory & safety awareness & reviewed need for supervision at all times at d/c. Educated them to remove throw rugs for increased safety with gait & f/u OPPT and recommended DME (RW, AFO, and w/c for community use). Educated James on caregiver positioning in relation to pt with movement & reviewed use of AFO. In gym, pt negotiates 3 steps backwards with RW & min assist, then with pt & husband return demonstrating with PT assisting at 1 step as pt not fully move RLE to lower step before lowering herself & experiencing LOB. PT educates them on compensatory pattern for stair negotiation. Pt completes car transfer at small SUV simulated height with RW & CGA with pt able to demonstrate R hip flexion but still requires UE to assist RLE into car. James then assists pt with ambulating with RW & CGA. PT provides pt with RLE HEP & verbally reviewed exercises with them. Pt left in w/c with husband present to supervise, anticipating next therapist's arrival. (Also reviewed w/c parts management but they still require cuing to ensure brakes are locked.) Pain: no c/o pain reported  Treatment 2: Pt received in bed & agreeable to tx. Reviewed HEP with pt performing RLE short arc quads, heel slides, and bridging with pt requiring min assist to maintain RLE neutral alignment and for multimodal cuing to focus on specific muscle groups vs compensating with others. Pt dons B socks & L shoe with set  up assist in bed, PT assists with R shoe & AFO. Pt transfers supine>sit with supervision & HOB elevated, stand pivot bed>w/c with CGA. Pt performs RLE long arc quads & attempts R hip flexion (pt with slightly improved activation today) in sitting. Sit<>stand with supervision with occasional cuing for safe hand placement. Gait x 300 ft with RW & R AFO with CGA, progressing to close supervision with min cuing for forward vs downward gaze. Standing taps to colored targets on floor without BUE support & CGA<>min assist for balance with task focusing on weight shifting L<>R, R LE NMR & strengthening, & dynamic balance. Progressed to tapping 3" step with BUE support on RW, CGA for balance, with focus on R hip flexion with verbal cuing for technique. Stand pivot w/c<>nu-step without AD & CGA. Nu-step on level 2 x 4  minutes + 4 minutes with BLE only with focus on BLE strengthening (RLE extensors) & NMR. Pt requires verbal/tactile cuing to maintain RLE neutral alignment vs abducting. At end of session pt left in w/c with chair alarm donned, call bell in reach, telesitter in room. Pain: no formal c/o pain  Therapy Documentation Precautions:  Precautions Precautions: Fall Precaution Comments: R hemiplegia Restrictions Weight Bearing Restrictions: No     Therapy/Group: Individual Therapy  Sandi Mariscal 08/05/2020, 2:22 PM

## 2020-08-06 ENCOUNTER — Encounter (HOSPITAL_COMMUNITY): Payer: BC Managed Care – PPO | Admitting: Occupational Therapy

## 2020-08-06 ENCOUNTER — Inpatient Hospital Stay (HOSPITAL_COMMUNITY): Payer: BC Managed Care – PPO | Admitting: Speech Pathology

## 2020-08-06 NOTE — Progress Notes (Signed)
Occupational Therapy Session Note  Patient Details  Name: Tanya Lopez MRN: [id number] Date of Birth: 04/10/1971  Today's Date: 08/06/2020 OT Group Time: 1100-1200 OT Group Time Calculation (min): 60 min  Skilled Therapeutic Interventions/Progress Updates:    Pt engaged in therapeutic w/c level dance group focusing on patient choice, UE/LE strengthening, salience, activity tolerance, and social participation. Pt was guided through various dance-based exercises involving UEs/LEs and trunk. All music was selected by group members. Emphasis placed on Rt NMR and standing balance. Verbal and tactile cues provided for increasing attention to the right side during group, pt visibly smiling behind mask, incorporating active assist techniques for the Rt LE while simultaneously moving UEs. Active and active assist exercises completed for the Rt UE in beat to music. She stood with OT and CGA during 1 song, swaying hips and engaging both UEs. 1 small LOB with CGA to correct. At end of session pt was returned to the room by NT.    Therapy Documentation Precautions:  Precautions Precautions: Fall Precaution Comments: R hemiplegia Restrictions Weight Bearing Restrictions: No Pain: no s/s pain during tx   ADL: ADL Eating: Minimal assistance Grooming: Minimal assistance Upper Body Bathing: Moderate assistance Lower Body Bathing: Maximal assistance Upper Body Dressing: Maximal assistance Lower Body Dressing: Maximal assistance Toileting: Maximal assistance Toilet Transfer: Maximal assistance      Therapy/Group: Group Therapy  Saban Heinlen A Brantly Kalman 08/06/2020, 12:34 PM

## 2020-08-06 NOTE — Progress Notes (Signed)
Speech Language Pathology Daily Session Note  Patient Details  Name: Tanya Lopez MRN: [id number] Date of Birth: May 19, 1971  Today's Date: 08/06/2020 SLP Individual Time: 2703-5009 SLP Individual Time Calculation (min): 30 min  Short Term Goals: Week 3: SLP Short Term Goal 1 (Week 3): STG=LTG due to short ELOS SLP Short Term Goal 6 - Progress (Week 3): Not progressing  Skilled Therapeutic Interventions: Skilled treatment session focused on cognitive-linguistic goals. Upon arrival, patient was asleep in bed but easily awakened. SLP facilitated session by providing extra time for tray set-up. Patient attempted to alternate her attention between self-feeding and functional conversation focused on recall of education in regards to language from previous therapy session. Patient required more than a reasonable amount of time and overall Min A verbal cues for word-finding at the phrase level. Suspect function impacted by fatigue and attempted dual tasking. SLP also facilitated a task of reading aloud to assess improvements in fluency. Although fluency was mildly improved, patient's prosody and intonation were impaired which made it difficult to follow. Patient left upright in bed with alarm on and all needs within reach.      Pain No/Denies Pain   Therapy/Group: Individual Therapy  Shantale Holtmeyer 08/06/2020, 8:17 AM

## 2020-08-06 NOTE — Progress Notes (Signed)
[place] PHYSICAL MEDICINE & REHABILITATION PROGRESS NOTE   Subjective/Complaints: No complaints. Speech much improved! Denies constipation, insomnia, pain  ROS:  Objective: Denies constipation, insomnia, pain   No results found. No results for input(s): WBC, HGB, HCT, PLT in the last 72 hours. No results for input(s): NA, K, CL, CO2, GLUCOSE, BUN, CREATININE, CALCIUM in the last 72 hours. No intake or output data in the 24 hours ending 08/06/20 1048   Physical Exam: Vital Signs Blood pressure 135/67, pulse 74, temperature 97.9 F (36.6 C), resp. rate 16, height 5\' 3"  (1.6 m), weight 90 kg, SpO2 99 %. General: Alert and oriented x 3, No apparent distress HEENT: Head is normocephalic, atraumatic, PERRLA, EOMI, sclera anicteric, oral mucosa pink and moist, dentition intact, ext ear canals clear,  Neck: Supple without JVD or lymphadenopathy Heart: Reg rate and rhythm. No murmurs rubs or gallops Chest: CTA bilaterally without wheezes, rales, or rhonchi; no distress Abdomen: Soft, non-tender, non-distended, bowel sounds positive. Extremities: No clubbing, cyanosis, or edema. Pulses are 2+ Skin: Clean and intact without signs of breakdown Neurological:     Mental Status: She is alert.     Comments: oriented x3 with extra time. Apraxic language but can speak in short sentences --no major changes today. Motor: Right hemiplegia RUE 3/5. RLE tr-1/5 Left upper extremity: 4-/5 proximal distal  Left lower extremity: 4+/5 proximal to distal  Musc: normal ROM   Assessment/Plan: 1. Functional deficits secondary to acute L ACA stroke which require 3+ hours per day of interdisciplinary therapy in a comprehensive inpatient rehab setting.  Physiatrist is providing close team supervision and 24 hour management of active medical problems listed below.  Physiatrist and rehab team continue to assess barriers to discharge/monitor patient progress toward functional and medical goals  Care  Tool:  Bathing    Body parts bathed by patient: Chest, Abdomen, Front perineal area, Right upper leg, Left upper leg, Face, Left arm, Right arm, Buttocks, Left lower leg, Right lower leg   Body parts bathed by helper: Right arm, Left arm, Buttocks, Right lower leg, Left lower leg     Bathing assist Assist Level: Contact Guard/Touching assist     Upper Body Dressing/Undressing Upper body dressing   What is the patient wearing?: Dress    Upper body assist Assist Level: Supervision/Verbal cueing    Lower Body Dressing/Undressing Lower body dressing      What is the patient wearing?: Underwear/pull up, Pants     Lower body assist Assist for lower body dressing: Minimal Assistance - Patient > 75%     Toileting Toileting    Toileting assist Assist for toileting: Minimal Assistance - Patient > 75%     Transfers Chair/bed transfer  Transfers assist     Chair/bed transfer assist level: Contact Guard/Touching assist     Locomotion Ambulation   Ambulation assist      Assist level: Contact Guard/Touching assist Assistive device: Walker-rolling Max distance: 300 ft   Walk 10 feet activity   Assist     Assist level: Contact Guard/Touching assist Assistive device: Walker-rolling   Walk 50 feet activity   Assist Walk 50 feet with 2 turns activity did not occur: Safety/medical concerns  Assist level: Contact Guard/Touching assist Assistive device: Walker-rolling    Walk 150 feet activity   Assist Walk 150 feet activity did not occur: Safety/medical concerns  Assist level: Contact Guard/Touching assist Assistive device: Walker-rolling    Walk 10 feet on uneven surface  activity   Assist Walk  10 feet on uneven surfaces activity did not occur: Safety/medical concerns         Wheelchair     Assist Will patient use wheelchair at discharge?: No             Wheelchair 50 feet with 2 turns activity    Assist             Wheelchair 150 feet activity     Assist          Blood pressure 135/67, pulse 74, temperature 97.9 F (36.6 C), resp. rate 16, height 5\' 3"  (1.6 m), weight 90 kg, SpO2 99 %.    Medical Problem List and Plan: 1. 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              -patient may shower             -ELOS/Goals:  8/19/Min A            -team conf today. Spoke with father this morning who is a physiatrist about her status and prognosis.     -AFO RLE,   PRAFO  -Continue CIR 2.  Antithrombotics: -DVT/anticoagulation:  Pharmaceutical: Lovenox             -antiplatelet therapy: On ASA, Plavix started for 6 months.  3. Pain Management: ?mild right hip contusion d/t recent fall. Pain seems minimal. Well controlled  -continue ice/local care  -activity as tolerated 4. Mood: LCSW to follow for evaluation and support.              -antipsychotic agents: N/A 5. Neuropsych: This patient maybe capable of making decisions on her own behalf. 6. Skin/Wound Care: Routine pressure relief measures.  7. Fluids/Electrolytes/Nutrition: Monitor I/Os. Electrolytes stable 8. HTN: Norvasc, Bystolic.   Vitals:   08/05/20 1945 08/06/20 0440  BP: 128/88 135/67  Pulse: 86 74  Resp: 20 16  Temp:  97.9 F (36.6 C)  SpO2: 99% 99%  Controlled 8/14 9. GERD: On Protonix.  10. Hyperlipidemia: Trig-1549-->330 11. Hypokalemia:              Stable on 8/2, 8/10 12. Stress induced hyperglycemia: Hgb A1c-5.6.              resolved 13: Neurogenic bladder: On Bactrim x5 days for UTI completed 8/5. Voiding well without dysuria 14. Constipation: On senna, moving bowels, eating well      LOS: 17 days A FACE TO FACE EVALUATION WAS PERFORMED  10/5 Tanya Lopez 08/06/2020, 10:48 AM

## 2020-08-07 NOTE — Progress Notes (Signed)
[place] PHYSICAL MEDICINE & REHABILITATION PROGRESS NOTE   Subjective/Complaints: No complaints this morning. Discussed with husband at bedside- he has seen great improvements as well. They are playing Apples to Apples  ROS:  Objective: Denies constipation, insomnia, pain   No results found. No results for input(s): WBC, HGB, HCT, PLT in the last 72 hours. No results for input(s): NA, K, CL, CO2, GLUCOSE, BUN, CREATININE, CALCIUM in the last 72 hours.  Intake/Output Summary (Last 24 hours) at 08/07/2020 1324 Last data filed at 08/07/2020 0814 Gross per 24 hour  Intake 75 ml  Output --  Net 75 ml     Physical Exam: Vital Signs Blood pressure (!) 107/53, pulse 86, temperature 98.5 F (36.9 C), resp. rate 14, height 5\' 3"  (1.6 m), weight 90 kg, SpO2 98 %. General: Alert and oriented x 3, No apparent distress HEENT: Head is normocephalic, atraumatic, PERRLA, EOMI, sclera anicteric, oral mucosa pink and moist, dentition intact, ext ear canals clear,  Neck: Supple without JVD or lymphadenopathy Heart: Reg rate and rhythm. No murmurs rubs or gallops Chest: CTA bilaterally without wheezes, rales, or rhonchi; no distress Abdomen: Soft, non-tender, non-distended, bowel sounds positive. Extremities: No clubbing, cyanosis, or edema. Pulses are 2+ Skin: Clean and intact without signs of breakdown Neurological:     Mental Status: She is alert.     Comments: oriented x3 with extra time. Apraxic language but can speak in short sentences --no major changes today. Motor: Right hemiplegia RUE 3/5. RLE tr-1/5 Left upper extremity: 4-/5 proximal distal  Left lower extremity: 4+/5 proximal to distal  Musc: normal ROM    Assessment/Plan: 1. Functional deficits secondary to acute L ACA stroke which require 3+ hours per day of interdisciplinary therapy in a comprehensive inpatient rehab setting.  Physiatrist is providing close team supervision and 24 hour management of active medical problems  listed below.  Physiatrist and rehab team continue to assess barriers to discharge/monitor patient progress toward functional and medical goals  Care Tool:  Bathing    Body parts bathed by patient: Chest, Abdomen, Front perineal area, Right upper leg, Left upper leg, Face, Left arm, Right arm, Buttocks, Left lower leg, Right lower leg   Body parts bathed by helper: Right arm, Left arm, Buttocks, Right lower leg, Left lower leg     Bathing assist Assist Level: Contact Guard/Touching assist     Upper Body Dressing/Undressing Upper body dressing   What is the patient wearing?: Dress    Upper body assist Assist Level: Supervision/Verbal cueing    Lower Body Dressing/Undressing Lower body dressing      What is the patient wearing?: Underwear/pull up, Pants     Lower body assist Assist for lower body dressing: Minimal Assistance - Patient > 75%     Toileting Toileting    Toileting assist Assist for toileting: Minimal Assistance - Patient > 75%     Transfers Chair/bed transfer  Transfers assist     Chair/bed transfer assist level: Contact Guard/Touching assist     Locomotion Ambulation   Ambulation assist      Assist level: Contact Guard/Touching assist Assistive device: Walker-rolling Max distance: 300 ft   Walk 10 feet activity   Assist     Assist level: Contact Guard/Touching assist Assistive device: Walker-rolling   Walk 50 feet activity   Assist Walk 50 feet with 2 turns activity did not occur: Safety/medical concerns  Assist level: Contact Guard/Touching assist Assistive device: Walker-rolling    Walk 150 feet activity  Assist Walk 150 feet activity did not occur: Safety/medical concerns  Assist level: Contact Guard/Touching assist Assistive device: Walker-rolling    Walk 10 feet on uneven surface  activity   Assist Walk 10 feet on uneven surfaces activity did not occur: Safety/medical concerns          Wheelchair     Assist Will patient use wheelchair at discharge?: No             Wheelchair 50 feet with 2 turns activity    Assist            Wheelchair 150 feet activity     Assist          Blood pressure (!) 107/53, pulse 86, temperature 98.5 F (36.9 C), resp. rate 14, height 5\' 3"  (1.6 m), weight 90 kg, SpO2 98 %.    Medical Problem List and Plan: 1. 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              -patient may shower             -ELOS/Goals:  8/19/Min A            -team conf today. Spoke with father this morning who is a physiatrist about her status and prognosis.     -AFO RLE,   PRAFO  -Continue CIR 2.  Antithrombotics: -DVT/anticoagulation:  Pharmaceutical: Lovenox             -antiplatelet therapy: On ASA, Plavix started for 6 months.  3. Pain Management: ?mild right hip contusion d/t recent fall. Pain seems minimal. Well controlled  -continue ice/local care  -activity as tolerated 4. Mood: LCSW to follow for evaluation and support.              -antipsychotic agents: N/A 5. Neuropsych: This patient maybe capable of making decisions on her own behalf. 6. Skin/Wound Care: Routine pressure relief measures.  7. Fluids/Electrolytes/Nutrition: Monitor I/Os. Electrolytes stable 8. HTN: Norvasc, Bystolic.   Vitals:   08/07/20 0524 08/07/20 1314  BP: 127/61 (!) 107/53  Pulse: 78 86  Resp: 18 14  Temp: 98.3 F (36.8 C) 98.5 F (36.9 C)  SpO2: 100% 98%  Controlled 8/15.  9. GERD: On Protonix.  10. Hyperlipidemia: Trig-1549-->330 11. Hypokalemia:              Stable on 8/2, 8/10 12. Stress induced hyperglycemia: Hgb A1c-5.6.              resolved 13: Neurogenic bladder: On Bactrim x5 days for UTI completed 8/5. Voiding well without dysuria 14. Constipation: On senna, moving bowels      LOS: 18 days A FACE TO  FACE EVALUATION WAS PERFORMED  Charon Akamine P Yates Weisgerber 08/07/2020, 1:24 PM

## 2020-08-08 ENCOUNTER — Inpatient Hospital Stay (HOSPITAL_COMMUNITY): Payer: BC Managed Care – PPO | Admitting: Physical Therapy

## 2020-08-08 ENCOUNTER — Inpatient Hospital Stay (HOSPITAL_COMMUNITY): Payer: BC Managed Care – PPO | Admitting: Occupational Therapy

## 2020-08-08 LAB — BASIC METABOLIC PANEL
Anion gap: 12 (ref 5–15)
BUN: 15 mg/dL (ref 6–20)
CO2: 22 mmol/L (ref 22–32)
Calcium: 9.4 mg/dL (ref 8.9–10.3)
Chloride: 105 mmol/L (ref 98–111)
Creatinine, Ser: 0.85 mg/dL (ref 0.44–1.00)
GFR calc Af Amer: >60 mL/min (ref >60)
GFR calc non Af Amer: >60 mL/min (ref >60)
Glucose, Bld: 109 mg/dL — ABNORMAL HIGH (ref 70–99)
Potassium: 3.7 mmol/L (ref 3.5–5.1)
Sodium: 139 mmol/L (ref 135–145)

## 2020-08-08 LAB — CBC
HCT: 37.1 % (ref 36.0–46.0)
Hemoglobin: 11.5 g/dL — ABNORMAL LOW (ref 12.0–15.0)
MCH: 25.8 pg — ABNORMAL LOW (ref 26.0–34.0)
MCHC: 31 g/dL (ref 30.0–36.0)
MCV: 83.2 fL (ref 80.0–100.0)
Platelets: 295 10*3/uL (ref 150–400)
RBC: 4.46 MIL/uL (ref 3.87–5.11)
RDW: 20 % — ABNORMAL HIGH (ref 11.5–15.5)
WBC: 6.6 10*3/uL (ref 4.0–10.5)
nRBC: 0 % (ref 0.0–0.2)

## 2020-08-08 NOTE — Plan of Care (Signed)
  Problem: Consults °Goal: RH STROKE PATIENT EDUCATION °Description: See Patient Education module for education specifics  °Outcome: Progressing °  °Problem: RH BOWEL ELIMINATION °Goal: RH STG MANAGE BOWEL WITH ASSISTANCE °Description: STG Manage Bowel with min Assistance. °Outcome: Progressing °Goal: RH STG MANAGE BOWEL W/MEDICATION W/ASSISTANCE °Description: STG Manage Bowel with Medication with min Assistance. °Outcome: Progressing °  °Problem: RH SKIN INTEGRITY °Goal: RH STG SKIN FREE OF INFECTION/BREAKDOWN °Description: Skin to remain free from breakdown with min assist while on rehab. °Outcome: Progressing °  °Problem: RH PAIN MANAGEMENT °Goal: RH STG PAIN MANAGED AT OR BELOW PT'S PAIN GOAL °Description: <4 on a 0-10 pain scale. °Outcome: Progressing °  °Problem: RH SAFETY °Goal: RH STG ADHERE TO SAFETY PRECAUTIONS W/ASSISTANCE/DEVICE °Description: STG Adhere to Safety Precautions With min Assistance and appropriate assistive Device. °Outcome: Progressing °  °

## 2020-08-08 NOTE — Progress Notes (Signed)
Patient ID: Tanya Lopez, female   DOB: [birth year], 49 y.o.   MRN: [id number]  SW ordered DME: 3in1 BSC, w/c, and RW.   SW called pt husband Fayrene Fearing [phone]) to inform on above ordered.   SW faxed outpatient PT/OT/SLP referral to [facility] (p:[phone]/f:[phone]).  Cecile Sheerer, MSW, LCSWA Office: [phone] Cell: [phone] Fax: [phone]

## 2020-08-08 NOTE — Progress Notes (Signed)
[place] PHYSICAL MEDICINE & REHABILITATION PROGRESS NOTE   Subjective/Complaints: No new problems. About to get showered and dressed when I came in.   ROS: Patient denies fever, rash, sore throat, blurred vision, nausea, vomiting, diarrhea, cough, shortness of breath or chest pain, joint or back pain, headache, or mood change.       No results found. Recent Labs    08/08/20 0543  WBC 6.6  HGB 11.5*  HCT 37.1  PLT 295   Recent Labs    08/08/20 0543  NA 139  K 3.7  CL 105  CO2 22  GLUCOSE 109*  BUN 15  CREATININE 0.85  CALCIUM 9.4    Intake/Output Summary (Last 24 hours) at 08/08/2020 1154 Last data filed at 08/08/2020 0900 Gross per 24 hour  Intake 300 ml  Output --  Net 300 ml     Physical Exam: Vital Signs Blood pressure 125/65, pulse 74, temperature 98.2 F (36.8 C), resp. rate 17, height 5\' 3"  (1.6 m), weight 90 kg, SpO2 96 %. Constitutional: No distress . Vital signs reviewed. HEENT: EOMI, oral membranes moist Neck: supple Cardiovascular: RRR without murmur. No JVD    Respiratory/Chest: CTA Bilaterally without wheezes or rales. Normal effort    GI/Abdomen: BS +, non-tender, non-distended Ext: no clubbing, cyanosis, or edema Psych: pleasant and cooperative Skin: Clean and intact without signs of breakdown Neurological:     Mental Status: She is alert.     Comments: oriented x3 with extra time. Apraxic language but can speak in short sentences stable exam. Motor: Right hemiplegia RUE 3/5. RLE tr-1/5 Left upper extremity: 4-/5 proximal distal  Left lower extremity: 4+/5 proximal to distal  Musc: normal ROM    Assessment/Plan: 1. Functional deficits secondary to acute L ACA stroke which require 3+ hours per day of interdisciplinary therapy in a comprehensive inpatient rehab setting.  Physiatrist is providing close team supervision and 24 hour management of active medical problems listed below.  Physiatrist and rehab team continue to assess barriers  to discharge/monitor patient progress toward functional and medical goals  Care Tool:  Bathing    Body parts bathed by patient: Chest, Abdomen, Front perineal area, Right upper leg, Left upper leg, Face, Left arm, Right arm, Buttocks, Left lower leg, Right lower leg   Body parts bathed by helper: Right arm, Left arm, Buttocks, Right lower leg, Left lower leg     Bathing assist Assist Level: Contact Guard/Touching assist     Upper Body Dressing/Undressing Upper body dressing   What is the patient wearing?: Dress    Upper body assist Assist Level: Supervision/Verbal cueing    Lower Body Dressing/Undressing Lower body dressing      What is the patient wearing?: Underwear/pull up, Pants     Lower body assist Assist for lower body dressing: Minimal Assistance - Patient > 75%     Toileting Toileting    Toileting assist Assist for toileting: Minimal Assistance - Patient > 75%     Transfers Chair/bed transfer  Transfers assist     Chair/bed transfer assist level: Contact Guard/Touching assist     Locomotion Ambulation   Ambulation assist      Assist level: Contact Guard/Touching assist Assistive device: Walker-rolling Max distance: 300 ft   Walk 10 feet activity   Assist     Assist level: Contact Guard/Touching assist Assistive device: Walker-rolling   Walk 50 feet activity   Assist Walk 50 feet with 2 turns activity did not occur: Safety/medical concerns  Assist  level: Contact Guard/Touching assist Assistive device: Walker-rolling    Walk 150 feet activity   Assist Walk 150 feet activity did not occur: Safety/medical concerns  Assist level: Contact Guard/Touching assist Assistive device: Walker-rolling    Walk 10 feet on uneven surface  activity   Assist Walk 10 feet on uneven surfaces activity did not occur: Safety/medical concerns         Wheelchair     Assist Will patient use wheelchair at discharge?: No              Wheelchair 50 feet with 2 turns activity    Assist            Wheelchair 150 feet activity     Assist          Blood pressure 125/65, pulse 74, temperature 98.2 F (36.8 C), resp. rate 17, height 5\' 3"  (1.6 m), weight 90 kg, SpO2 96 %.    Medical Problem List and Plan: 1. 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              -patient may shower             -ELOS/Goals:  8/19/Min A            -progressing toward goals.     -AFO RLE,   PRAFO    2.  Antithrombotics: -DVT/anticoagulation:  Pharmaceutical: Lovenox             -antiplatelet therapy: On ASA, Plavix started for 6 months.  3. Pain Management: ?mild right hip contusion d/t recent fall. Pain seems minimal. Well controlled  -continue ice/local care  -activity as tolerated 4. Mood: LCSW to follow for evaluation and support.              -antipsychotic agents: N/A 5. Neuropsych: This patient maybe capable of making decisions on her own behalf. 6. Skin/Wound Care: Routine pressure relief measures.  7. Fluids/Electrolytes/Nutrition: Monitor I/Os. Electrolytes stable 8. HTN: Norvasc, Bystolic.   Vitals:   08/07/20 1947 08/08/20 0405  BP: 126/83 125/65  Pulse: 83 74  Resp: 18 17  Temp: 97.6 F (36.4 C) 98.2 F (36.8 C)  SpO2: 98% 96%  Controlled 8/16 9. GERD: On Protonix.  10. Hyperlipidemia: Trig-1549-->330 11. Hypokalemia:              Stable on 8/2, 8/10 12. Stress induced hyperglycemia: Hgb A1c-5.6.              resolved 13: Neurogenic bladder: On Bactrim x5 days for UTI completed 8/5. Voiding well without dysuria 14. Constipation: On senna, moving bowels      LOS: 19 days A FACE TO FACE EVALUATION WAS PERFORMED  10/5 08/08/2020, 11:54 AM

## 2020-08-08 NOTE — Discharge Instructions (Signed)
STROKE/TIA DISCHARGE INSTRUCTIONS SMOKING Cigarette smoking nearly doubles your risk of having a stroke & is the single most alterable risk factor  If you smoke or have smoked in the last 12 months, you are advised to quit smoking for your health.  Most of the excess cardiovascular risk related to smoking disappears within a year of stopping.  Ask you doctor about anti-smoking medications  [place] Quit Line: 1-800-QUIT NOW  Free Smoking Cessation Classes [phone]  CHOLESTEROL Know your levels; limit fat & cholesterol in your diet  Lipid Panel     Component Value Date/Time   CHOL 239 (H) 07/19/2020 0300   TRIG 330 (H) 07/20/2020 0350   HDL 43 07/19/2020 0300   CHOLHDL 5.6 07/19/2020 0300   VLDL 72 (H) 07/19/2020 0300   LDLCALC 124 (H) 07/19/2020 0300      Many patients benefit from treatment even if their cholesterol is at goal.  Goal: Total Cholesterol (CHOL) less than 160  Goal:  Triglycerides (TRIG) less than 150  Goal:  HDL greater than 40  Goal:  LDL (LDLCALC) less than 100   BLOOD PRESSURE American Stroke Association blood pressure target is less that 120/80 mm/Hg  Your discharge blood pressure is:  BP: 125/65  Monitor your blood pressure  Limit your salt and alcohol intake  Many individuals will require more than one medication for high blood pressure  DIABETES (A1c is a blood sugar average for last 3 months) Goal HGBA1c is under 7% (HBGA1c is blood sugar average for last 3 months)  Diabetes: No known diagnosis of diabetes    Lab Results  Component Value Date   HGBA1C 5.6 07/18/2020     Your HGBA1c can be lowered with medications, healthy diet, and exercise.  Check your blood sugar as directed by your physician  Call your physician if you experience unexplained or low blood sugars.  PHYSICAL ACTIVITY/REHABILITATION Goal is 30 minutes at least 4 days per week  Activity: Increase activity slowly, Therapies: Physical Therapy: Home Health Return to work:    Activity decreases your risk of heart attack and stroke and makes your heart stronger.  It helps control your weight and blood pressure; helps you relax and can improve your mood.  Participate in a regular exercise program.  Talk with your doctor about the best form of exercise for you (dancing, walking, swimming, cycling).  DIET/WEIGHT Goal is to maintain a healthy weight  Your discharge diet is:  Diet Order            Diet Heart Room service appropriate? Yes with Assist; Fluid consistency: Thin  Diet effective now                 liquids Your height is:  Height: 5\' 3"  (160 cm) Your current weight is: Weight: 90 kg Your Body Mass Index (BMI) is:  BMI (Calculated): 35.16  Following the type of diet specifically designed for you will help prevent another stroke.  Your goal weight range is:    Your goal Body Mass Index (BMI) is 19-24.  Healthy food habits can help reduce 3 risk factors for stroke:  High cholesterol, hypertension, and excess weight.  RESOURCES Stroke/Support Group:  Call [phone]   STROKE EDUCATION PROVIDED/REVIEWED AND GIVEN TO PATIENT Stroke warning signs and symptoms How to activate emergency medical system (call 911). Medications prescribed at discharge. Need for follow-up after discharge. Personal risk factors for stroke. Pneumonia vaccine given:  Flu vaccine given:  My questions have been answered, the  writing is legible, and I understand these instructions.  I will adhere to these goals & educational materials that have been provided to me after my discharge from the hospital.   Inpatient Rehab Discharge Instructions  Tanya Lopez Discharge date and time: No discharge date for patient encounter.   Activities/Precautions/ Functional Status: Activity: As tolerated Diet: Regular Wound Care: Routine skin checks Functional status:  ___ No restrictions     ___ Walk up steps independently ___ 24/7 supervision/assistance   ___ Walk up steps  with assistance ___ Intermittent supervision/assistance  ___ Bathe/dress independently ___ Walk with walker     _x__ Bathe/dress with assistance ___ Walk Independently    ___ Shower independently ___ Walk with assistance    ___ Shower with assistance ___ No alcohol     ___ Return to work/school ________  COMMUNITY REFERRALS UPON DISCHARGE:    Outpatient: PT    OT    ST             Agency: [facility]  Phone: [phone]             Appointment Date/Time:*Please expect follow-up within 7-10 business day to schedule your appointment. If you have not received follow-up, be sure to contact the site directly.*  Medical Equipment/Items Ordered: wheelchair, 3in1 bedside commode, rolling walker                                                 Agency/Supplier: [facility] [phone]    Special Instructions: No driving smoking or alcohol   My questions have been answered and I understand these instructions. I will adhere to these goals and the provided educational materials after my discharge from the hospital.  Patient/Caregiver Signature _______________________________ Date __________  Clinician Signature _______________________________________ Date __________  Please bring this form and your medication list with you to all your follow-up doctor's appointments.

## 2020-08-08 NOTE — Progress Notes (Signed)
Occupational Therapy Session Note  Patient Details  Name: Tanya Lopez MRN: [id number] Date of Birth: 1971-08-31  Today's Date: 08/08/2020 OT Individual Time: 1000-1100 OT Individual Time Calculation (min): 60 min    Short Term Goals: Week 3:  OT Short Term Goal 1 (Week 3): LTG=STG 2/2 ELOS  Skilled Therapeutic Interventions/Progress Updates:  Patient met seated in wc in agreement with OT treatment session with focus on functional transfers, RUE NMR, household mobility, and dynamic standing balance as detailed below. Functional mobility from room to therapy gym with CGA. Total assist for wc transport rest of the way to ADL apartment for energy conservation and time management. OT provided education on safety with RW for EOB <> supine transfers to standard bed with patient able to return demonstrate with good use of compensatory strategies. Simulated walk-in shower transfer to shower chair (patient has built-in shower bench at home) with CGA and cues for safety. Education on completion of shower transfers with RLE brace on vs. off. In kitchen, patient provided with walker basket and education on safety with transportation of meal items while using RW. Patient able to return demonstrate while gathering items in kitchen demonstrating good attention to R visual field and safety awareness. In visitors lounge, patient made cup of coffee using kurig demonstrating safe transport of hot items. Session concluded with patient seated in wc with call bell within reach, belt alarm activated, and all needs met.   Therapy Documentation Precautions:  Precautions Precautions: Fall Precaution Comments: R hemiplegia Restrictions Weight Bearing Restrictions: No General:    Therapy/Group: Individual Therapy  [name] 08/08/2020, 7:59 AM

## 2020-08-08 NOTE — Progress Notes (Signed)
Orthopedic Tech Progress Note Patient Details:  IMO CUMBIE [birth year] [id number] Called in order to [facility] for an AFO CONSULT Patient ID: ZANETTA DEHAAN, female   DOB: 04/12/1971, 49 y.o.   MRN: [id number]   Donald Pore 08/08/2020, 1:37 PM

## 2020-08-08 NOTE — Progress Notes (Signed)
Occupational Therapy Session Note  Patient Details  Name: Tanya Lopez MRN: [id number] Date of Birth: 1971-05-02  Today's Date: 08/08/2020 OT Individual Time: 0900-1000 OT Individual Time Calculation (min): 60 min    Short Term Goals: Week 3:  OT Short Term Goal 1 (Week 3): LTG=STG 2/2 ELOS  Skilled Therapeutic Interventions/Progress Updates:    patient seated in bed at start of session.   She denies pain and states that she would like to take a shower this am.  To edge of bed with CS.  Sit to stand and short distance ambulation with RW bed to w/c with CGA.  She is able to pick out clothing seated w/c level.  Ambulation with RW to shower bench with CGA.  Bathing completed at shower level seated on bench with CS, min cues (good use of right UE for thorough washing)  Dressing completed seated arm chair - CS for OH shirt, min A underwear/pants, CM min A in stance, socks set up, right shoe/AFO max A, left shoe set up.  Ambulated to w/c with CGA, completed grooming tasks, oral care and blow dryer for wet hair with CS, min cues.   Completed ambulation on unit to/from therapy gym with CS/CGA, seated UB AROM.  She returned to w/c at close of session, seat belt alarm set, telesitter in place, call bell and tray table in reach.    Therapy Documentation Precautions:  Precautions Precautions: Fall Precaution Comments: R hemiplegia Restrictions Weight Bearing Restrictions: No   Therapy/Group: Individual Therapy  Barrie Lyme 08/08/2020, 7:35 AM

## 2020-08-08 NOTE — Progress Notes (Signed)
Physical Therapy Session Note  Patient Details  Name: Tanya Lopez MRN: [id number] Date of Birth: May 20, 1971  Today's Date: 08/08/2020 PT Individual Time: 7510-2585 PT Individual Time Calculation (min): 69 min   Short Term Goals: Week 3:  PT Short Term Goal 1 (Week 3): STG = LTG due to anticipated d/c date.  Skilled Therapeutic Interventions/Progress Updates:  Pt received in bed & agreeable to tx. Supine>sit with supervision & pt dons socks, shoes & R AFO with min assist for R shoe/AFO. Sit<>stand with supervision with ongoing cuing for safe hand placement. Gait around unit in controlled hallways & in apartment over carpet with RW & supervision. Pt negotiates 4 steps backwards with RW & min assist. Pt completes bed mobility in apartment with supervision & cuing to scoot to center of bed before rolling L; pt attends to RUE/RLE with cuing & extra time. Pt completes transfer from low compliant couch with RW & supervision. Pt completes Berg Balance Test & scores 33/56; educated pt on fall risk & interpretation of score. Patient demonstrates increased fall risk as noted by score of 33/56 on Berg Balance Scale.  (<36= high risk for falls, close to 100%; 37-45 significant >80%; 46-51 moderate >50%; 52-55 lower >25%). Pt attempts sit<>stand without BUE support with focus on equal weight bearing BLE but pt continues to demonstrate increased weight bearing LLE & inability to fully extend R knee. Gait x 100 ft without AD & min assist with PT facilitating increased weight shifting L to allow increased foot clearance RLE with task focusing on R NMR & high level balance. Back in room pt performed RLE single leg bridging with PT assisting with maintaining neutral alignment RLE; task focused on RLE hip extensor & glute strengthening. Pt left in bed with alarm set & all needs, call bell in reach.   Therapy Documentation Precautions:  Precautions Precautions: Fall Precaution Comments: R  hemiplegia Restrictions Weight Bearing Restrictions: No  Pain: Denies pain  Balance: Balance Balance Assessed: Yes Standardized Balance Assessment Standardized Balance Assessment: Berg Balance Test Berg Balance Test Sit to Stand: Able to stand without using hands and stabilize independently Standing Unsupported: Able to stand 2 minutes with supervision Sitting with Back Unsupported but Feet Supported on Floor or Stool: Able to sit safely and securely 2 minutes Stand to Sit: Sits safely with minimal use of hands Transfers: Able to transfer with verbal cueing and /or supervision Standing Unsupported with Eyes Closed: Able to stand 10 seconds with supervision Standing Ubsupported with Feet Together: Able to place feet together independently and stand for 1 minute with supervision From Standing, Reach Forward with Outstretched Arm: Reaches forward but needs supervision From Standing Position, Pick up Object from Floor: Able to pick up shoe, needs supervision From Standing Position, Turn to Look Behind Over each Shoulder: Needs supervision when turning Turn 360 Degrees: Needs close supervision or verbal cueing (completes turns to both directions with supervision) Standing Unsupported, Alternately Place Feet on Step/Stool: Needs assistance to keep from falling or unable to try Standing Unsupported, One Foot in Front: Able to take small step independently and hold 30 seconds Standing on One Leg: Able to lift leg independently and hold equal to or more than 3 seconds (pt elects to stand on LLE) Total Score: 33    Therapy/Group: Individual Therapy  Sandi Mariscal 08/08/2020, 3:11 PM

## 2020-08-09 ENCOUNTER — Encounter (HOSPITAL_COMMUNITY): Payer: BC Managed Care – PPO | Admitting: Speech Pathology

## 2020-08-09 ENCOUNTER — Ambulatory Visit (HOSPITAL_COMMUNITY): Payer: BC Managed Care – PPO | Admitting: Physical Therapy

## 2020-08-09 ENCOUNTER — Inpatient Hospital Stay (HOSPITAL_COMMUNITY): Payer: BC Managed Care – PPO | Admitting: Physical Therapy

## 2020-08-09 ENCOUNTER — Encounter (HOSPITAL_COMMUNITY): Payer: BC Managed Care – PPO | Admitting: Occupational Therapy

## 2020-08-09 ENCOUNTER — Inpatient Hospital Stay (HOSPITAL_COMMUNITY): Payer: BC Managed Care – PPO | Admitting: Occupational Therapy

## 2020-08-09 ENCOUNTER — Encounter (HOSPITAL_COMMUNITY): Payer: BC Managed Care – PPO | Admitting: Psychology

## 2020-08-09 DIAGNOSIS — I63512 Cerebral infarction due to unspecified occlusion or stenosis of left middle cerebral artery: Secondary | ICD-10-CM

## 2020-08-09 NOTE — Patient Care Conference (Signed)
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 Faith Rogue Care Coodinator Present: Kennyth Arnold, RN, BSN, CRRN;Cecile Sheerer, LCSWA Nurse Present: Other (comment) Eulah Citizen, RN) PT Present: Aleda Grana, PT OT Present: Kearney Hard, OT SLP Present: Feliberto Gottron, SLP PPS Coordinator present : Edson Snowball, Park Breed, SLP     Current Status/Progress Goal Weekly Team Focus  Bowel/Bladder   Continent of bowel and bladder LBM 8/15  Will remain continent of B&B  assess toileting needs qshift and PRN   Swallow/Nutrition/ Hydration             ADL's   Min A/CGA overall  Supervision/CGA  self-care retraining, dc planning, pt/family education, balance, R NMR   Mobility   CGA transfer & gait, min assist stairs with RW  supervision<>Min assist overall  gait, transfers, bed mobility, strengthening, endurance, pt education, awraeness/cognition, R NMR, d/c planning   Communication   Min A  Supervision  Family Education   Safety/Cognition/ Behavioral Observations  Supervision-Min A  Supervision  Family Education   Pain   No c/o pain  Pt will remain pain free while at the hospital  assess pain qshift and PRN   Skin   No skin impairments  SKin will remain intact or  prevent breakdown while at the hospital  assess skin qshift and PRN     Discharge Planning:  Pt to discharge home with husband. Husband to provide care at discharge. Fam edu on Friday 8/13 9am-12pm; and Tuesday 8/17 1pm-3pm with her husband.   Team Discussion: Patient is at supervision level and on target for discharge. Family education complete. Patient on target to meet rehab goals: yes  *See Care Plan and progress notes for long and short-term goals.   Revisions to Treatment Plan:  none  Teaching Needs: none  Current Barriers to Discharge: None noted  Possible Resolutions to Barriers: n/a     Medical Summary Current Status: gradually improving apraxia. afo per [facility].  Barriers to Discharge: Medical stability   Possible Resolutions to Barriers/Weekly Focus: finalizing medical plan for discharge   Continued Need for Acute Rehabilitation Level of Care: The patient requires daily medical management by a physician with specialized training in physical medicine and rehabilitation for the following reasons: Direction of a multidisciplinary physical rehabilitation program to maximize functional independence : Yes Medical management of patient stability for increased activity during participation in an intensive rehabilitation regime.: Yes Analysis of laboratory values and/or radiology reports with any subsequent need for medication adjustment and/or medical intervention. : Yes   I attest that I was present, lead the team conference, and concur with the assessment and plan of the team.   Tennis Must 08/09/2020, 12:50 PM

## 2020-08-09 NOTE — Consult Note (Signed)
Neuropsychological Consultation   Patient:   Tanya Lopez   DOB:   May 26, 1971  MR Number:  [id number]  Location:  [facility] [address] CENTER A [address] [id number] [place] [address] Dept: [phone] Loc: [phone]           Date of Service:   08/09/2020  Start Time:   3 PM End Time:   4 PM  Provider/Observer:  Arley Phenix, Psy.D.       Clinical Neuropsychologist       Billing Code/Service: 91638  Chief Complaint:    Tanya Lopez. Tiller is a 49 year old female with history of hypertension.  The patient was admitted on 07/16/2020 with right hemiparesis, numbness, dysarthria.  Patient with significant aphasia initially.  CT head showed left frontal subarachnoid hemorrhage.  The subarachnoid hemorrhage was focal in the medial posterior left frontal lobe with question of venous hemorrhage therefore TPA was not administered.  Further studies including CTA of head showed left A2 segment occlusion with incomplete filling of left frontal cortical vein.  Patient underwent cerebral angio revealing smooth narrowing of L ACA A2 and branches of A2 suspicious for dissection.  MRI/MRI brain done revealed moderate left ACA infarct, small acute right cerebellar infarct, left occipital and right thalamic infarcts.  No significant stenosis was noted.  Hospital course was complicated by hypokalemia.  Patient continuing with expressive aphasia with minimal verbal output and right flaccid hemiparesis affecting mobility and ADLs with subsequent CIR recommendation due to functional decline.  The patient has made significant improvements in expressive language and right motor functioning.  However, patient continues to have paraphasic errors and reduction in self initiated behaviors.  The patient is going to be discharged home in 2 days.  He will be follow-up with outpatient therapies.  Reason for Service:  Patient referred for  neuropsychological consultation due to residual cognitive and motor deficits and issues with adjustment in adaption with plan discharge and follow-up outpatient.  Below is the HPI for the current admission.  HPI:  Tanya Lopez. Oftedahl is a 49 year old female with history of HTN who was admitted on 07/16/2020 with right hemiparesis, numbness, dysarthria.  History taken from chart review and husband due to aphasia.  CT head personally reviewed, showing left frontal SAH.  Per report, focal SAH in medial posterior left frontal lobe question venous hemorrhage therefore not tPA candidate. CTA hed showed left A2 segment occlusion with incomplete filling of left frontal cortical vein question cortical vein thrombosis--angio recommended. She underwent cerebral angio by Dr.Deveshwar revealing smooth narrowing of L-ACA A2 and branches of A2 suspicious for dissection and no intraluminal filling defects noted.  MRI/MRA brain done revealing moderate L-ACA infarct, small acute right cerebellar, left occipital and right thalamic infarcts--no signifcant stenosis and improvement in L-A2 flow. Elevated BP treated with Cardene drip and she was placed on ASA daily with recommendations to repeat CTA head in one month and start DAPT in 3 days?  Lactic acidosis at admission improved with IVF. Dr. Roda Shutters felt that stroke was of unknown etiology due to dissection v/s cardiembolic source.  TEE showed it ejection fraction of 65 to 70% without any wall motion abnormalities, however positive for PFO with right to left shunting. TCD bubble study ordered to decide on loop v/s 30 day event monitor.  BLE dopplers ordered and were negative for DVT. On regular diet with thin liquids and aspiration precautions. Hospital course further complicated by hypokalemia. Therapy evaluations done  revealing expressive aphasia with minimal verbal output and right flaccid hemiparesis affecting mobility and ADLs. CIR recommended due to functional decline.  Please  see preadmission assessment from earlier today as well.  Current Status:  Upon entering the room, the patient was sitting upright on her bed in a crosslegged position.  Her husband was present at the time.  The patient was alert and displayed some expressive language deficits and no initiation of questions or interactions.  The patient would respond directly to questions but did not interject any questions or self.  Patient's husband identified lack of self initiated behaviors in the patient.  Her speech was halting at times with limited lexicon and limited verbal output.  There was an apparent reduction in verbal fluency and the patient struggled at times with words and had a couple of paraphasic errors during conversations.  The patient was not always aware of these paraphasic errors.  There were also issues related to executive functioning and otherwise along with issues with verbal reasoning and problem-solving.  The patient has made significant improvements from her initial presentation.  Patient denied any significant mood disturbance and denied significant depressive or anxiety based symptomatology.  Behavioral Observation: Tanya Lopez  presents as a 49 y.o.-year-old Right Female who appeared her stated age. her dress was Appropriate and she was Well Groomed and her manners were Appropriate to the situation.  her participation was indicative of Appropriate and Inattentive behaviors.  There were physical disabilities noted.  she displayed an appropriate level of cooperation and motivation.     Interactions:    Minimal Inattentive patient would respond and engage was spoken to but there was not a great deal of self initiated behavior.  Attention:   abnormal and attention span appeared shorter than expected for age  Memory:   abnormal; remote memory intact, recent memory impaired  Visuo-spatial:  abnormal  Speech (Volume):  low  Speech:   non-fluent aphasia;   Thought  Process:  Circumstantial  Though Content:  WNL; not suicidal and not homicidal  Orientation:   person and place  Judgment:   Poor  Planning:   Poor  Affect:    Blunted and Flat  Mood:    Dysphoric  Insight:   Shallow  Intelligence:   high   Medical History:   Past Medical History:  Diagnosis Date  . Hypertension   . PFO (patent foramen ovale) 07/19/2020   Noted on TEE 07/19/2020    Psychiatric History:  No prior psychiatric history.  Family Med/Psych History:  Family History  Problem Relation Age of Onset  . Stroke Mother   . High blood pressure Mother   . High blood pressure Father     Impression/DX:  Lailoni Baquera. Thaw is a 49 year old female with history of hypertension.  The patient was admitted on 07/16/2020 with right hemiparesis, numbness, dysarthria.  Patient with significant aphasia initially.  CT head showed left frontal subarachnoid hemorrhage.  The subarachnoid hemorrhage was focal in the medial posterior left frontal lobe with question of venous hemorrhage therefore TPA was not administered.  Further studies including CTA of head showed left A2 segment occlusion with incomplete filling of left frontal cortical vein.  Patient underwent cerebral angio revealing smooth narrowing of L ACA A2 and branches of A2 suspicious for dissection.  MRI/MRI brain done revealed moderate left ACA infarct, small acute right cerebellar infarct, left occipital and right thalamic infarcts.  No significant stenosis was noted.  Hospital course was complicated by hypokalemia.  Patient continuing with expressive aphasia with minimal verbal output and right flaccid hemiparesis affecting mobility and ADLs with subsequent CIR recommendation due to functional decline.  The patient has made significant improvements in expressive language and right motor functioning.  However, patient continues to have paraphasic errors and reduction in self initiated behaviors.  The patient is going to be  discharged home in 2 days.  He will be follow-up with outpatient therapies.  Upon entering the room, the patient was sitting upright on her bed in a crosslegged position.  Her husband was present at the time.  The patient was alert and displayed some expressive language deficits and no initiation of questions or interactions.  The patient would respond directly to questions but did not interject any questions or self.  Patient's husband identified lack of self initiated behaviors in the patient.  Her speech was halting at times with limited lexicon and limited verbal output.  There was an apparent reduction in verbal fluency and the patient struggled at times with words and had a couple of paraphasic errors during conversations.  The patient was not always aware of these paraphasic errors.  There were also issues related to executive functioning and otherwise along with issues with verbal reasoning and problem-solving.  The patient has made significant improvements from her initial presentation.  Patient denied any significant mood disturbance and denied significant depressive or anxiety based symptomatology.  Disposition/Plan:  The patient has been discharged in 2 days.  She is going to be followed outpatient with outpatient therapies planned and will have a follow-up with Dr. Riley Kill as well.  I talked with both the patient as well as her husband regarding the plan if they do develop any significant challenges with coping and adjustment during the recovery process that I am available if needed and instructed how they could get in touch with me.  After we get past this acute/early stage post stroke there may be appropriate need for further neuropsychological testing.  I would be available for that if need be.  Diagnosis:    Acute ischemic left MCA stroke (HCC) - Plan: Ambulatory referral to Neurology, Ambulatory referral to Physical Medicine Rehab, Ambulatory Referral to Neuro Rehab  Fall - Plan: DG HIP  UNILAT WITH PELVIS 2-3 VIEWS RIGHT, DG HIP UNILAT WITH PELVIS 2-3 VIEWS RIGHT, CANCELED: DG HIP UNILAT WITH PELVIS MIN 4 VIEWS RIGHT, CANCELED: DG HIP UNILAT WITH PELVIS MIN 4 VIEWS RIGHT         Electronically Signed   _______________________ Arley Phenix, Psy.D.

## 2020-08-09 NOTE — Progress Notes (Signed)
Occupational Therapy Session Note  Patient Details  Name: Tanya Lopez MRN: [id number] Date of Birth: 10/28/1971  Today's Date: 08/09/2020 OT Individual Time: 1030-1100 OT Individual Time Calculation (min): 30 min    Short Term Goals: Week 1:  OT Short Term Goal 1 (Week 1): Pt will complete toilet transfer with mod A of 1 OT Short Term Goal 1 - Progress (Week 1): Met OT Short Term Goal 2 (Week 1): Pt will maintain standing balance within bADL task with mod A OT Short Term Goal 2 - Progress (Week 1): Met OT Short Term Goal 3 (Week 1): Pt will complete 1 step of LB dressing task OT Short Term Goal 3 - Progress (Week 1): Met Week 2:  OT Short Term Goal 1 (Week 2): Pt will dress R UE first with mod questioning cues OT Short Term Goal 1 - Progress (Week 2): Met OT Short Term Goal 2 (Week 2): Pt will open container using R hand with mod questioning cues OT Short Term Goal 2 - Progress (Week 2): Met OT Short Term Goal 3 (Week 2): Pt will use B UEs to pull up pants in standing OT Short Term Goal 3 - Progress (Week 2): Met Week 3:  OT Short Term Goal 1 (Week 3): LTG=STG 2/2 ELOS    Skilled Therapeutic Interventions/Progress Updates:      Pt seen for BADL retraining of toileting, bathing, and dressing with a focus on safe transfer skills with R side awareness.  See ADL documentation below for details.  Using RW with CGA to ambulate to bathroom pt was able to complete transfer to toilet and tub bench with S but did need cues to step back far enough as she had some inattention to R side for spatial awareness with distance from where she was standing to the seat.  In shower and with dressing, actively used her R hand as a dominant assist.  She did need a few cues at times which appeared to be from delayed processing but may have just been delayed due to aphasia.    Overall, pt completed all tasks including washing her hair in a timely manner. Returned to bed to rest until next session. Bed  alarm set.   Therapy Documentation Precautions:  Precautions Precautions: Fall Precaution Comments: R hemiplegia Restrictions Weight Bearing Restrictions: No   Pain: Pain Assessment Pain Scale: 0-10 Pain Score: 0-No pain ADL: Upper Body Bathing: Supervision/safety Where Assessed-Upper Body Bathing: Shower Lower Body Bathing: Supervision/safety Where Assessed-Lower Body Bathing: Shower Upper Body Dressing: Supervision/safety Where Assessed-Upper Body Dressing: Other (Comment) (toilet) Lower Body Dressing: Supervision/safety Where Assessed-Lower Body Dressing: Other (Comment) (toilet) Toileting: Supervision/safety Where Assessed-Toileting: Glass blower/designer: Close supervision Armed forces technical officer Method: Magazine features editor: Close supervision Social research officer, government Method: Heritage manager: Radio broadcast assistant   Therapy/Group: Individual Therapy  [place] 08/09/2020, 12:33 PM

## 2020-08-09 NOTE — Progress Notes (Signed)
Physical Therapy Session Note  Patient Details  Name: Tanya Lopez MRN: [id number] Date of Birth: Mar 26, 1971  Today's Date: 08/09/2020 PT Individual Time: 0806-0900 and 2979-8921 PT Individual Time Calculation (min): 54 min and 40 min  Short Term Goals: Week 3:  PT Short Term Goal 1 (Week 3): STG = LTG due to anticipated d/c date.  Skilled Therapeutic Interventions/Progress Updates:  Treatment 1: Pt received in bed & agreeable to tx. Supine>sit with supervision & pt dons socks, shoes & R AFO with only min assist to push R foot down into shoe. Pt is able to tie laces without assistance. Sit<>stand with supervision and gait throughout unit with RW & supervision. Pt completes floor transfer with supervision & PT discussed fall recovery. Pt utilized kinetron in standing with BUE>RUE>no UE support with task focusing on weight shifting L<>R, dynamic balance, RLE strengthening & NMR. Gait training with pt stepping over poles & weaving between cones with RW & supervision with instructional cuing for navigating obstacles. Retrograde gait x 20 ft + 20 ft without AD & min assist with focus on high level balance training, RLE NMR & extensor strengthening, and weight shifting L<>R. Back in room pt transfers to bed with supervision. Pt left in bed with alarm set & all needs in reach. Pain: denies pain  Treatment 2: Pt received in bed with husband Fayrene Fearing) present for caregiver training. Pt transfers to EOB with supervision & dons shoes with min assist for RLE. Stand pivot bed>w/c with CGA without AD and PT transports pt to gym via w/c dependent assist for time management. Pt negotiates 3 steps backwards with RW & Fayrene Fearing assisting & providing cuing for compensatory technique. Pt does experience 1 LOB when descending stairs as she attempts to lower body before RLE is flat on floor; PT provides education re: safety with stair negotiation. Pt ambulates ~50 ft with RW without AFO with ability to advance RLE but  still instability at R ankle & some inversion noted. Gait with R foot up brace with minimal improvement noted in gait with orthosis. Chris Science writer) arrived & observed pt's gait with foot up & with PLS AFO. Pt with improved foot control with PLS & PT & Thayer Ohm recommend pt d/c with this orthotic - pt & James in agreement. Pt ambulates back to room with RW & supervision from [place]. Pt left on EOB with Fayrene Fearing present to supervise, SLP entering room. Pt & Fayrene Fearing voice comfort re: pt d/c home Thursday.  Pain: no c/o pain reported  Therapy Documentation Precautions:  Precautions Precautions: Fall Precaution Comments: R hemiplegia Restrictions Weight Bearing Restrictions: No   Therapy/Group: Individual Therapy  Sandi Mariscal 08/09/2020, 3:51 PM

## 2020-08-09 NOTE — Progress Notes (Signed)
Occupational Therapy Session Note  Patient Details  Name: Tanya Lopez MRN: [id number] Date of Birth: Jun 20, 1971  Today's Date: 08/09/2020 OT Individual Time: 1415-1455 OT Individual Time Calculation (min): 40 min   Short Term Goals: Week 3; LTG=STG 2.2 ELOS  Skilled Therapeutic Interventions/Progress Updates:    Pt greeted seated in wc with spouse present for family education. Pt ambulated to therapy apartment with supervision. OT set-up simulated walk-in shower transfer like home environment. Spouse provided supervision for walk-in shower transfer with good cues for safety. Pt then ambulated to dayroom where OT issued home fine motor program and reviewed theraputty exercises. Pt returned to room with supervision and left semi-reclined in bed with bed alarm on, husband present, and needs met.   Therapy Documentation Precautions:  Precautions Precautions: Fall Precaution Comments: R hemiplegia Restrictions Weight Bearing Restrictions: No Pain:  denies pain  Therapy/Group: Individual Therapy  Valma Cava 08/09/2020, 3:03 PM

## 2020-08-09 NOTE — Progress Notes (Signed)
Speech Language Pathology Discharge Summary  Patient Details  Name: Tanya Lopez MRN: [id number] Date of Birth: August 07, 1971  Today's Date: 08/10/2020 SLP Individual Time: 0932-6712 SLP Individual Time Calculation (min): 55 min   Skilled Therapeutic Interventions:  Skilled treatment session focused on family education and communication goals. Upon arrival, patient reported that her parents wanted to talk to the SLP. Therefore, SLP spent extensive time education the patient's parents regarding her current language impairments, goals of skilled SLP intervention and prognosis via Facetime. All questions answered to their satisfaction. SLP completed the WAB with patient scoring WFL on all subtests. However, patient continues to demonstrate decreased initiation of verbal expression with extra time needed for word-finding and verbal expression at the phrase and sentence level. Patient requested to donn new underwear and required Min verbal cues for safety with task. Patient left upright in bed with alarm on and all needs within reach. Continue with current plan of care.   Patient has met 5 of 5 long term goals.  Patient to discharge at overall Supervision level.   Reasons goals not met: N/A   Clinical Impression/Discharge Summary: Patient has made excellent gains and has met 5 of 5 LTGs this admission. Currently, patient requires overall supervision-Min A level verbal cues and extra time for word-finding and verbal expression of mildly complex information at the phrase and sentence level. Overall supervision level verbal cues are also required for functional problem solving and emergent awareness of errors which continues to impact her overall safety. Patient and family education is complete and patient will discharge home with 24 hour supervision from family. Patient would benefit from f/u SLP outpatient services to maximize her cognitive-linguistic functioning and overall functional independence in  order to reduce caregiver burden.   Care Partner:  Caregiver Able to Provide Assistance: Yes  Type of Caregiver Assistance: Physical;Cognitive  Recommendation:  24 hour supervision/assistance;Outpatient SLP  Rationale for SLP Follow Up: Reduce caregiver burden;Maximize cognitive function and independence;Maximize functional communication   Equipment: N/A   Reasons for discharge: Discharged from hospital;Treatment goals met   Patient/Family Agrees with Progress Made and Goals Achieved: Yes    [place], [place] 08/09/2020, 3:27 PM

## 2020-08-09 NOTE — Progress Notes (Addendum)
Occupational Therapy Discharge Summary  Patient Details  Name: Tanya Lopez MRN: [id number] Date of Birth: 1971/11/12   Patient has met 12 of 24  long term goals due to improved activity tolerance, improved balance, postural control, ability to compensate for deficits, functional use of  RIGHT upper and RIGHT lower extremity, improved attention, improved awareness and improved coordination.  Patient to discharge at overall Independent level.  Patient's care partner is independent to provide the necessary physical assistance at discharge.    Reasons goals not met: n/a  Recommendation:  Patient will benefit from ongoing skilled OT services in outpatient setting to continue to advance functional skills in the area of BADL.  Equipment: 3-in-1 BSC   Reasons for discharge: treatment goals met and discharge from hospital  Patient/family agrees with progress made and goals achieved: Yes  OT Discharge Precautions/Restrictions  Precautions Precautions: Fall Precaution Comments: R hemiplegia Restrictions Weight Bearing Restrictions: No Pain  denies pain ADL ADL Eating: Independent Grooming: Independent Upper Body Bathing: Supervision/safety Where Assessed-Upper Body Bathing: Shower Lower Body Bathing: Supervision/safety Where Assessed-Lower Body Bathing: Shower Upper Body Dressing: Supervision/safety Where Assessed-Upper Body Dressing: Other (Comment) (toilet) Lower Body Dressing: Supervision/safety Where Assessed-Lower Body Dressing: Other (Comment) (toilet) Toileting: Supervision/safety Where Assessed-Toileting: Glass blower/designer: Close supervision Armed forces technical officer Method: Magazine features editor: Close supervision Social research officer, government Method: Heritage manager: Tourist information centre manager Overall Cognitive Status: Impaired/Different from baseline Arousal/Alertness: Awake/alert Orientation Level: Oriented X4 Attention:  Selective Selective Attention: Impaired Selective Attention Impairment: Functional complex Memory: Impaired Memory Impairment: Decreased recall of new information Awareness: Impaired Awareness Impairment: Anticipatory impairment Safety/Judgment: Impaired Sensation Coordination Gross Motor Movements are Fluid and Coordinated: No Fine Motor Movements are Fluid and Coordinated: No Coordination and Movement Description: improved smoothness and accuracy Mobility  Bed Mobility Rolling Right: Supervision/verbal cueing Rolling Left: Supervision/Verbal cueing Supine to Sit: Supervision/Verbal cueing Sit to Supine: Supervision/Verbal cueing Transfers Sit to Stand: Supervision/Verbal cueing Stand to Sit: Supervision/Verbal cueing  Balance Static Sitting Balance Static Sitting - Level of Assistance: 7: Independent Dynamic Sitting Balance Dynamic Sitting - Balance Support: During functional activity Dynamic Sitting - Level of Assistance: 5: Stand by assistance Static Standing Balance Static Standing - Balance Support: During functional activity Static Standing - Level of Assistance: 5: Stand by assistance Dynamic Standing Balance Dynamic Standing - Balance Support: During functional activity Dynamic Standing - Level of Assistance: 5: Stand by assistance Extremity/Trunk Assessment RUE Assessment RUE Assessment: Exceptions to [facility] RUE Body System: Neuro Brunstrum levels for arm and hand: Arm;Hand Brunstrum level for arm: Stage V Relative Independence from Synergy Brunstrum level for hand: Stage VI Isolated joint movements RUE Strength RUE Overall Strength Comments: Strength WFL, limitations more in attention and apraxia LUE Assessment LUE Assessment: Within Functional Limits   Daneen Schick Rosibel Giacobbe 08/09/2020, 3:21 PM

## 2020-08-09 NOTE — Progress Notes (Signed)
[place] PHYSICAL MEDICINE & REHABILITATION PROGRESS NOTE   Subjective/Complaints: Up and out with therapy already. No new problems.   ROS: Patient denies fever, rash, sore throat, blurred vision, nausea, vomiting, diarrhea, cough, shortness of breath or chest pain, joint or back pain, headache, or mood change.      No results found. Recent Labs    08/08/20 0543  WBC 6.6  HGB 11.5*  HCT 37.1  PLT 295   Recent Labs    08/08/20 0543  NA 139  K 3.7  CL 105  CO2 22  GLUCOSE 109*  BUN 15  CREATININE 0.85  CALCIUM 9.4    Intake/Output Summary (Last 24 hours) at 08/09/2020 1158 Last data filed at 08/09/2020 0700 Gross per 24 hour  Intake 480 ml  Output --  Net 480 ml     Physical Exam: Vital Signs Blood pressure 125/65, pulse 86, temperature 98.8 F (37.1 C), resp. rate 19, height 5\' 3"  (1.6 m), weight 87.6 kg, SpO2 100 %. Constitutional: No distress . Vital signs reviewed. HEENT: EOMI, oral membranes moist Neck: supple Cardiovascular: RRR without murmur. No JVD    Respiratory/Chest: CTA Bilaterally without wheezes or rales. Normal effort    GI/Abdomen: BS +, non-tender, non-distended Ext: no clubbing, cyanosis, or edema Psych: pleasant and cooperative Skin: Clean and intact without signs of breakdown Neurological:     Mental Status: She is alert.     Comments: oriented x3. Apraxic.  Motor: Right hemiplegia RUE 3/5. RLE tr-1/5 with flashes of more movement Left upper extremity: 4-/5 proximal distal  Left lower extremity: 4+/5 proximal to distal  Musc: normal ROM    Assessment/Plan: 1. Functional deficits secondary to acute L ACA stroke which require 3+ hours per day of interdisciplinary therapy in a comprehensive inpatient rehab setting.  Physiatrist is providing close team supervision and 24 hour management of active medical problems listed below.  Physiatrist and rehab team continue to assess barriers to discharge/monitor patient progress toward  functional and medical goals  Care Tool:  Bathing    Body parts bathed by patient: Chest, Abdomen, Front perineal area, Right upper leg, Left upper leg, Face, Left arm, Right arm, Buttocks, Left lower leg, Right lower leg   Body parts bathed by helper: Right arm, Left arm, Buttocks, Right lower leg, Left lower leg     Bathing assist Assist Level: Supervision/Verbal cueing     Upper Body Dressing/Undressing Upper body dressing   What is the patient wearing?: Pull over shirt    Upper body assist Assist Level: Supervision/Verbal cueing    Lower Body Dressing/Undressing Lower body dressing      What is the patient wearing?: Underwear/pull up, Pants     Lower body assist Assist for lower body dressing: Minimal Assistance - Patient > 75%     Toileting Toileting    Toileting assist Assist for toileting: Minimal Assistance - Patient > 75%     Transfers Chair/bed transfer  Transfers assist     Chair/bed transfer assist level: Supervision/Verbal cueing     Locomotion Ambulation   Ambulation assist      Assist level: Supervision/Verbal cueing Assistive device: Walker-rolling Max distance: 150 ft   Walk 10 feet activity   Assist     Assist level: Supervision/Verbal cueing Assistive device: Walker-rolling   Walk 50 feet activity   Assist Walk 50 feet with 2 turns activity did not occur: Safety/medical concerns  Assist level: Supervision/Verbal cueing Assistive device: Walker-rolling    Walk 150 feet activity  Assist Walk 150 feet activity did not occur: Safety/medical concerns  Assist level: Supervision/Verbal cueing Assistive device: Walker-rolling    Walk 10 feet on uneven surface  activity   Assist Walk 10 feet on uneven surfaces activity did not occur: Safety/medical concerns         Wheelchair     Assist Will patient use wheelchair at discharge?: No             Wheelchair 50 feet with 2 turns  activity    Assist            Wheelchair 150 feet activity     Assist          Blood pressure 125/65, pulse 86, temperature 98.8 F (37.1 C), resp. rate 19, height 5\' 3"  (1.6 m), weight 87.6 kg, SpO2 100 %.    Medical Problem List and Plan: 1. 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              -patient may shower             -ELOS/Goals:  8/19/Min A            -progressing toward goals.     -AFO RLE per [employer],   PRAFO    2.  Antithrombotics: -DVT/anticoagulation:  Pharmaceutical: Lovenox             -antiplatelet therapy: On ASA, Plavix started for 6 months.  3. Pain Management: ?mild right hip contusion d/t recent fall. Pain seems minimal. Well controlled  -continue ice/local care  -activity as tolerated 4. Mood: LCSW to follow for evaluation and support.              -antipsychotic agents: N/A 5. Neuropsych: This patient maybe capable of making decisions on her own behalf. 6. Skin/Wound Care: Routine pressure relief measures.  7. Fluids/Electrolytes/Nutrition: good po intake 8. HTN: Norvasc, Bystolic.   Vitals:   08/08/20 1919 08/09/20 0437  BP: 136/73 125/65  Pulse: 88 86  Resp: 18 19  Temp: 98.2 F (36.8 C) 98.8 F (37.1 C)  SpO2: 100% 100%  Controlled 8/17 9. GERD: On Protonix.  10. Hyperlipidemia: Trig-[id number]-->330 11. Hypokalemia:              3.7 8/16 12. Stress induced hyperglycemia: Hgb A1c-5.6.              resolved 13: Neurogenic bladder: On Bactrim x5 days for UTI completed 8/5. Voiding well without dysuria 14. Constipation: On senna, moving bowels      LOS: 20 days A FACE TO FACE EVALUATION WAS PERFORMED  10/5 08/09/2020, 11:58 AM

## 2020-08-09 NOTE — Progress Notes (Signed)
Physical Therapy Discharge Summary  Patient Details  Name: Tanya Lopez MRN: [id number] Date of Birth: Jul 14, 1971  Today's Date: 08/10/2020   Patient has met 9 of 9 long term goals due to improved activity tolerance, improved balance, improved postural control, increased strength, ability to compensate for deficits, functional use of  right upper extremity and right lower extremity, improved attention, improved awareness and improved coordination.  Patient to discharge at an ambulatory level supervision gait with RW, min assist stairs with RW.   Patient's care partner is independent to provide the necessary physical and cognitive assistance at discharge.  Reasons goals not met: n/a  Recommendation:  Patient will benefit from ongoing skilled PT services in outpatient setting to continue to advance safe functional mobility, address ongoing impairments in R NMR, balance, gait with LRAD, stair negotiation, endurance, strengthening, cognition, and minimize fall risk.  Equipment: 18x16 w/c for community use, RW, R AFO  Reasons for discharge: treatment goals met and discharge from hospital  Patient/family agrees with progress made and goals achieved: Yes  PT Discharge Precautions/Restrictions Precautions Precautions: Fall Precaution Comments: R hemiplegia Restrictions Weight Bearing Restrictions: No  Vision/Perception  Pt wears glasses at all times at baseline.  No current apparent visual deficits.  Perception: pt with decreased attention to R side of body.  Cognition Overall Cognitive Status: Impaired/Different from baseline Arousal/Alertness: Awake/alert Orientation Level: Oriented X4 Attention: Selective Selective Attention: Impaired Selective Attention Impairment: Functional complex (easily distracted in busy environment) Memory: Impaired Memory Impairment: Decreased recall of new information Awareness: Impaired Awareness Impairment: Anticipatory  impairment Behaviors:  (slightly impulsive with mobility) Safety/Judgment: Impaired   Sensation Sensation Light Touch: Appears Intact (RLE) Proprioception: Appears Intact (RLE) Coordination Gross Motor Movements are Fluid and Coordinated: No (impaired RUE/RLE) Fine Motor Movements are Fluid and Coordinated: No (impaired RUE/RLE)   Motor  Motor Motor: Abnormal postural alignment and control Motor - Discharge Observations: R hemiparesis   Mobility Bed Mobility Bed Mobility: Supine to Sit;Rolling Right;Rolling Left;Sit to Supine Rolling Right: Supervision/verbal cueing Rolling Left: Supervision/Verbal cueing (cuing to scoot to center of bed to allow space to roll L, cuing to attend to RUE/RLE & technique for rolling) Supine to Sit: Supervision/Verbal cueing Sit to Supine: Supervision/Verbal cueing Transfers Transfers: Sit to Stand;Stand Pivot Transfers;Stand to Sit Sit to Stand: Supervision/Verbal cueing Stand to Sit: Supervision/Verbal cueing Stand Pivot Transfers: Supervision/Verbal cueing Transfer (Assistive device): Rolling walker  Locomotion  Gait Ambulation: Yes Gait Assistance: Supervision/Verbal cueing Gait Distance (Feet):  (>150 ft) Assistive device: Rolling walker (R AFO) Gait Assistance Details: cuing for forward vs downward gaze Gait Gait: Yes Gait Pattern: Impaired Gait Pattern: Decreased hip/knee flexion - right;Decreased dorsiflexion - right Gait velocity: decreased Stairs / Additional Locomotion Stairs: Yes Stairs Assistance: Minimal Assistance - Patient > 75% Stair Management Technique: Backwards;With walker Number of Stairs: 4 Height of Stairs: 6 (inches) Wheelchair Mobility Wheelchair Mobility:  12 steps (6" + 3") with B rails & supervision   Trunk/Postural Assessment  Cervical Assessment Cervical Assessment: Exceptions to [facility] (slight forward head) Thoracic Assessment Thoracic Assessment: Exceptions to WFL (slight rounded shoulders) Postural  Control Postural Control: Deficits on evaluation Righting Reactions: delayed Protective Responses: delayed   Balance Balance Balance Assessed: Yes Standardized Balance Assessment Standardized Balance Assessment: Berg Balance Test Berg Balance Test on 08/08/20 Sit to Stand: Able to stand without using hands and stabilize independently Standing Unsupported: Able to stand 2 minutes with supervision Sitting with Back Unsupported but Feet Supported on Floor or Stool: Able to sit safely and  securely 2 minutes Stand to Sit: Sits safely with minimal use of hands Transfers: Able to transfer with verbal cueing and /or supervision Standing Unsupported with Eyes Closed: Able to stand 10 seconds with supervision Standing Ubsupported with Feet Together: Able to place feet together independently and stand for 1 minute with supervision From Standing, Reach Forward with Outstretched Arm: Reaches forward but needs supervision From Standing Position, Pick up Object from Floor: Able to pick up shoe, needs supervision From Standing Position, Turn to Look Behind Over each Shoulder: Needs supervision when turning Turn 360 Degrees: Needs close supervision or verbal cueing (completes turns to both directions with supervision) Standing Unsupported, Alternately Place Feet on Step/Stool: Needs assistance to keep from falling or unable to try Standing Unsupported, One Foot in Front: Able to take small step independently and hold 30 seconds Standing on One Leg: Able to lift leg independently and hold equal to or more than 3 seconds (pt elects to stand on LLE) Total Score: 33  Extremity Assessment  RUE Assessment (per OT assessment) RUE Assessment: Exceptions to [facility] RUE Body System: Neuro Brunstrum levels for arm and hand: Arm;Hand Brunstrum level for arm: Stage V Relative Independence from Synergy Brunstrum level for hand: Stage VI Isolated joint movements RUE Strength RUE Overall Strength Comments: Strength  WFL, limitations more in attention and apraxia LUE Assessment LUE Assessment: Within Functional Limits  RLE Assessment RLE Assessment: Exceptions to [facility] Passive Range of Motion (PROM) Comments: [facility] Strength Comments: 0/5 ankle dorsiflexion, 2/5 hip flexion in sitting, 2/5 knee extension in sitting LLE Assessment LLE Assessment: Within [place] 08/10/2020, 9:11 AM

## 2020-08-09 NOTE — Progress Notes (Signed)
Speech Language Pathology Daily Session Note  Patient Details  Name: Tanya Lopez MRN: [id number] Date of Birth: 1971/12/09  Today's Date: 08/09/2020 SLP Individual Time: 1345-1410 SLP Individual Time Calculation (min): 25 min  Short Term Goals: Week 3: SLP Short Term Goal 1 (Week 3): STG=LTG due to short ELOS   Skilled Therapeutic Interventions: Skilled treatment session focused on communication goals and completion of family education with the patient and his husband. SLP provided education regarding strategies to utilize at home to maximize word-finding as well as activities to utilize to maximize verbal output. Both verbalized understanding and agreement. SLP initiated administration of the Western Aphasia Battery (WAB) that will be completed during next session. Patient left upright in the wheelchair with husband present. Continue with current plan of care.      Pain No/Denies Pain   Therapy/Group: Individual Therapy  Gadge Hermiz 08/09/2020, 3:13 PM

## 2020-08-10 ENCOUNTER — Inpatient Hospital Stay (HOSPITAL_COMMUNITY): Payer: BC Managed Care – PPO | Admitting: Physical Therapy

## 2020-08-10 ENCOUNTER — Inpatient Hospital Stay (HOSPITAL_COMMUNITY): Payer: BC Managed Care – PPO | Admitting: Speech Pathology

## 2020-08-10 ENCOUNTER — Inpatient Hospital Stay (HOSPITAL_COMMUNITY): Payer: BC Managed Care – PPO | Admitting: Occupational Therapy

## 2020-08-10 MED ORDER — PANTOPRAZOLE SODIUM 40 MG PO TBEC: 40.0000 mg | DELAYED_RELEASE_TABLET | Freq: Every day | ORAL | 0 refills | Status: DC

## 2020-08-10 MED ORDER — NEBIVOLOL HCL 5 MG PO TABS: 5.0000 mg | ORAL_TABLET | Freq: Every day | ORAL | 0 refills | Status: AC

## 2020-08-10 MED ORDER — AMLODIPINE BESYLATE 10 MG PO TABS: 10.0000 mg | ORAL_TABLET | Freq: Every day | ORAL | 0 refills | Status: AC

## 2020-08-10 MED ORDER — CLOPIDOGREL BISULFATE 75 MG PO TABS: 75.0000 mg | ORAL_TABLET | Freq: Every day | ORAL | 0 refills | Status: AC

## 2020-08-10 MED ORDER — ATORVASTATIN CALCIUM 80 MG PO TABS: 80.0000 mg | ORAL_TABLET | Freq: Every day | ORAL | 0 refills | Status: DC

## 2020-08-10 NOTE — Telephone Encounter (Addendum)
Per Dr. Roda Shutters, scheduled patient for PFO consult with Dr. Excell Seltzer 8/30. Her husband, Dr. Pearson Grippe, agrees with plan.

## 2020-08-10 NOTE — Progress Notes (Signed)
[place] PHYSICAL MEDICINE & REHABILITATION PROGRESS NOTE   Subjective/Complaints: No concerns this morning from RN Fannie Knee  No complaints from patient   ROS: Patient denies fever, rash, sore throat, blurred vision, nausea, vomiting, diarrhea, cough, shortness of breath or chest pain, joint or back pain, headache, or mood change.      No results found. Recent Labs    08/08/20 0543  WBC 6.6  HGB 11.5*  HCT 37.1  PLT 295   Recent Labs    08/08/20 0543  NA 139  K 3.7  CL 105  CO2 22  GLUCOSE 109*  BUN 15  CREATININE 0.85  CALCIUM 9.4    Intake/Output Summary (Last 24 hours) at 08/10/2020 0916 Last data filed at 08/10/2020 0721 Gross per 24 hour  Intake 300 ml  Output --  Net 300 ml     Physical Exam: Vital Signs Blood pressure (!) 115/47, pulse 90, temperature 99.9 F (37.7 C), temperature source Oral, resp. rate 16, height 5\' 3"  (1.6 m), weight 87.6 kg, SpO2 98 %. General: Alert and oriented x 3, No apparent distress HEENT: Head is normocephalic, atraumatic, PERRLA, EOMI, sclera anicteric, oral mucosa pink and moist, dentition intact, ext ear canals clear,  Neck: Supple without JVD or lymphadenopathy Heart: Reg rate and rhythm. No murmurs rubs or gallops Chest: CTA bilaterally without wheezes, rales, or rhonchi; no distress Abdomen: Soft, non-tender, non-distended, bowel sounds positive. Extremities: No clubbing, cyanosis, or edema. Pulses are 2+ Skin: Clean and intact without signs of breakdown Neurological:     Mental Status: She is alert.     Comments: oriented x3. Apraxic.  Motor: Right hemiplegia RUE 3/5. RLE tr-1/5 with flashes of more movement Left upper extremity: 4-/5 proximal distal  Left lower extremity: 4+/5 proximal to distal  Musc: normal ROM  Assessment/Plan: 1. Functional deficits secondary to acute L ACA stroke which require 3+ hours per day of interdisciplinary therapy in a comprehensive inpatient rehab setting.  Physiatrist is providing  close team supervision and 24 hour management of active medical problems listed below.  Physiatrist and rehab team continue to assess barriers to discharge/monitor patient progress toward functional and medical goals  Care Tool:  Bathing    Body parts bathed by patient: Chest, Abdomen, Front perineal area, Right upper leg, Left upper leg, Face, Left arm, Right arm, Buttocks, Left lower leg, Right lower leg   Body parts bathed by helper: Right arm, Left arm, Buttocks, Right lower leg, Left lower leg     Bathing assist Assist Level: Supervision/Verbal cueing     Upper Body Dressing/Undressing Upper body dressing   What is the patient wearing?: Dress    Upper body assist Assist Level: Supervision/Verbal cueing    Lower Body Dressing/Undressing Lower body dressing      What is the patient wearing?: Underwear/pull up     Lower body assist Assist for lower body dressing: Supervision/Verbal cueing     Toileting Toileting    Toileting assist Assist for toileting: Supervision/Verbal cueing     Transfers Chair/bed transfer  Transfers assist     Chair/bed transfer assist level: Supervision/Verbal cueing     Locomotion Ambulation   Ambulation assist      Assist level: Supervision/Verbal cueing Assistive device: Walker-rolling (R AFO) Max distance: >150 ft   Walk 10 feet activity   Assist     Assist level: Supervision/Verbal cueing Assistive device: Walker-rolling (R AFO)   Walk 50 feet activity   Assist Walk 50 feet with 2 turns activity  did not occur: Safety/medical concerns  Assist level: Supervision/Verbal cueing Assistive device: Walker-rolling (R AFO)    Walk 150 feet activity   Assist Walk 150 feet activity did not occur: Safety/medical concerns  Assist level: Supervision/Verbal cueing Assistive device: Walker-rolling (R AFO)    Walk 10 feet on uneven surface  activity   Assist Walk 10 feet on uneven surfaces activity did not occur:  Safety/medical concerns   Assist level: Supervision/Verbal cueing Assistive device: Walker-rolling (R AFO)   Wheelchair     Assist Will patient use wheelchair at discharge?: No (only for community mobility if needed & if so, family will propel w/c, otherwise pt ambulating long distances)   Wheelchair activity did not occur: N/A         Wheelchair 50 feet with 2 turns activity    Assist    Wheelchair 50 feet with 2 turns activity did not occur: N/A       Wheelchair 150 feet activity     Assist  Wheelchair 150 feet activity did not occur: N/A       Blood pressure (!) 115/47, pulse 90, temperature 99.9 F (37.7 C), temperature source Oral, resp. rate 16, height 5\' 3"  (1.6 m), weight 87.6 kg, SpO2 98 %.    Medical Problem List and Plan: 1. 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              -patient may shower             -ELOS/Goals:  8/19/Min A            -progressing toward goals.     -AFO RLE per [employer],   PRAFO   Continue CIR 2.  Antithrombotics: -DVT/anticoagulation:  Pharmaceutical: Lovenox             -antiplatelet therapy: On ASA, Plavix started for 6 months.  3. Pain Management: ?mild right hip contusion d/t recent fall. Pain seems minimal. Well controlled  -continue ice/local care  -activity as tolerated 4. Mood: LCSW to follow for evaluation and support.              -antipsychotic agents: N/A 5. Neuropsych: This patient maybe capable of making decisions on her own behalf. 6. Skin/Wound Care: Routine pressure relief measures.  7. Fluids/Electrolytes/Nutrition: good po intake 8. HTN: Norvasc, Bystolic.   Vitals:   08/09/20 1939 08/10/20 0542  BP: 122/74 (!) 115/47  Pulse: (!) 101 90  Resp: 16 16  Temp: 99.6 F (37.6 C) 99.9 F (37.7 C)  SpO2: 98% 98%  Controlled 8/18 9. GERD: On Protonix.  10. Hyperlipidemia:  Trig-1549-->330 11. Hypokalemia:              3.7 8/16 12. Stress induced hyperglycemia: Hgb A1c-5.6.              resolved 13: Neurogenic bladder: On Bactrim x5 days for UTI completed 8/5. Voiding well without dysuria 14. Constipation: On senna, moving bowels      LOS: 21 days A FACE TO FACE EVALUATION WAS PERFORMED  10/5 Tanya Lopez 08/10/2020, 9:16 AM

## 2020-08-10 NOTE — Progress Notes (Signed)
Occupational Therapy Session Note  Patient Details  Name: Tanya Lopez MRN: [id number] Date of Birth: 1971/09/20  Today's Date: 08/10/2020 OT Individual Time: 0945-1100 OT Individual Time Calculation (min): 75 min    Short Term Goals: Week 1:  OT Short Term Goal 1 (Week 1): Pt will complete toilet transfer with mod A of 1 OT Short Term Goal 1 - Progress (Week 1): Met OT Short Term Goal 2 (Week 1): Pt will maintain standing balance within bADL task with mod A OT Short Term Goal 2 - Progress (Week 1): Met OT Short Term Goal 3 (Week 1): Pt will complete 1 step of LB dressing task OT Short Term Goal 3 - Progress (Week 1): Met Week 2:  OT Short Term Goal 1 (Week 2): Pt will dress R UE first with mod questioning cues OT Short Term Goal 1 - Progress (Week 2): Met OT Short Term Goal 2 (Week 2): Pt will open container using R hand with mod questioning cues OT Short Term Goal 2 - Progress (Week 2): Met OT Short Term Goal 3 (Week 2): Pt will use B UEs to pull up pants in standing OT Short Term Goal 3 - Progress (Week 2): Met Week 3:  OT Short Term Goal 1 (Week 3): LTG=STG 2/2 ELOS      Skilled Therapeutic Interventions/Progress Updates:    Pt seen this session for BADL training of shower, toileting, dressing, grooming. Pt accomplished all tasks with S but did need mod A with R shoe with AFO as it is challenging to don even for this therapist! Pt then ambulated to gym with S with RW to focus on RUE/ RLE strengthening: -2lb dowel bar overhead and chest pressed, rows -single arm on vertical bar with "stirring" the pot and forearm rotation for "baton swings" - isolated [place] with piano finger tapping working on alternating fingers to improve [name] to enable her to type faster. -standing balance at bar with heel raises, hip flexion with kicking leg forward and small circles with leg extended - seated tossing basketball down to floor and catching with B hands for speed/coordination  Pt did  extremely well with all exercises.  Ambulated back to room and opted to rest in bed. Removed AFO for pt.  Bed alarm set and all needs met.   Therapy Documentation Precautions:  Precautions Precautions: Fall Precaution Comments: R hemiplegia Restrictions Weight Bearing Restrictions: No  Pain: Pain Assessment Pain Scale: 0-10 Pain Score: 0-No pain ADL: ADL Eating: Independent Grooming: Independent Upper Body Bathing: Supervision/safety Where Assessed-Upper Body Bathing: Shower Lower Body Bathing: Supervision/safety Where Assessed-Lower Body Bathing: Shower Upper Body Dressing: Supervision/safety Where Assessed-Upper Body Dressing: Other (Comment) (toilet) Lower Body Dressing: Supervision/safety Where Assessed-Lower Body Dressing: Other (Comment) (toilet) Toileting: Supervision/safety Where Assessed-Toileting: Glass blower/designer: Close supervision Armed forces technical officer Method: Magazine features editor: Close supervision Social research officer, government Method: Heritage manager: Radio broadcast assistant   Therapy/Group: Individual Therapy  [place] 08/10/2020, 12:24 PM

## 2020-08-10 NOTE — Progress Notes (Signed)
Physical Therapy Session Note  Patient Details  Name: PARUL PORCELLI MRN: [id number] Date of Birth: Jan 02, 1971  Today's Date: 08/10/2020 PT Individual Time: 0737-0901 PT Individual Time Calculation (min): 84 min   Short Term Goals: Week 3:  PT Short Term Goal 1 (Week 3): STG = LTG due to anticipated d/c date.  Skilled Therapeutic Interventions/Progress Updates:  Pt received sitting EOB finishing breakfast & agreeable to tx. Pt dons B socks, shoes & R AFO with min assist for RLE. Pt completes sit<>stand transfers with supervision with instructional cuing for safe hand placement. Gait around unit with RW, R AFO & supervision with step through gait pattern. Pt negotiates ramp & uneven mulch with RW & supervision, completes car transfer at sedan simulated height with supervision. Pt negotiates 4 steps backwards with RW & min assist, 12 steps (6" + 3") with B rails & supervision. Pt engages in zoom ball with focus on RUE strengthening & NMR. Pt stands on airex foam while engaging in pipe tree activity with focus on standing balance on compliant surface & cognitive task with pt requiring only min assist for pipe tree shapes. Pt engages in wii bowling without UE support & with supervision with focus on standing tolerance & balance, & R UE NMR as she uses remote. Pt utilizes nu-step on level 5 x 12 minutes with all four extremities with task focusing on global strengthening, endurance training, & R NMR; pt does require a rest break at 7 minutes 30 seconds 2/2 fatigue. At end of session pt returns to bed & is left with bed alarm set, call bell & all needs in reach.  Pt's sensation tested, please see d/c summary.  Therapy Documentation Precautions:  Precautions Precautions: Fall Precaution Comments: R hemiplegia Restrictions Weight Bearing Restrictions: No  Pain: Denies pain   Therapy/Group: Individual Therapy  Sandi Mariscal 08/10/2020, 9:11 AM

## 2020-08-11 NOTE — Progress Notes (Signed)
[facility] PHYSICAL MEDICINE & REHABILITATION PROGRESS NOTE   Subjective/Complaints: No new complaints. Excited to be going home  ROS: limited due to language/communication     No results found. No results for input(s): WBC, HGB, HCT, PLT in the last 72 hours. No results for input(s): NA, K, CL, CO2, GLUCOSE, BUN, CREATININE, CALCIUM in the last 72 hours.  Intake/Output Summary (Last 24 hours) at 08/11/2020 1028 Last data filed at 08/11/2020 0815 Gross per 24 hour  Intake 222 ml  Output --  Net 222 ml     Physical Exam: Vital Signs Blood pressure 119/72, pulse 87, temperature 98.8 F (37.1 C), temperature source Oral, resp. rate 16, height 5\' 3"  (1.6 m), weight 87.6 kg, SpO2 99 %. Constitutional: No distress . Vital signs reviewed. HEENT: EOMI, oral membranes moist Neck: supple Cardiovascular: RRR without murmur. No JVD    Respiratory/Chest: CTA Bilaterally without wheezes or rales. Normal effort    GI/Abdomen: BS +, non-tender, non-distended Ext: no clubbing, cyanosis, or edema Psych: pleasant and cooperative Skin: Clean and intact without signs of breakdown Neurological:     Mental Status: She is alert.     Comments: oriented x3. apraxic.  Motor: Right hemiplegia RUE 3/5. RLE tr-1/5 with flashes of more movement Left upper extremity: 4-/5 proximal distal  Left lower extremity: 4+/5 proximal to distal  Musc: normal ROM  Assessment/Plan: 1. Functional deficits secondary to acute L ACA stroke which require 3+ hours per day of interdisciplinary therapy in a comprehensive inpatient rehab setting.  Physiatrist is providing close team supervision and 24 hour management of active medical problems listed below.  Physiatrist and rehab team continue to assess barriers to discharge/monitor patient progress toward functional and medical goals  Care Tool:  Bathing    Body parts bathed by patient: Chest, Abdomen, Front perineal area, Right upper leg, Left upper leg, Face, Left  arm, Right arm, Buttocks, Left lower leg, Right lower leg   Body parts bathed by helper: Right arm, Left arm, Buttocks, Right lower leg, Left lower leg     Bathing assist Assist Level: Supervision/Verbal cueing     Upper Body Dressing/Undressing Upper body dressing   What is the patient wearing?: Dress    Upper body assist Assist Level: Supervision/Verbal cueing    Lower Body Dressing/Undressing Lower body dressing      What is the patient wearing?: Underwear/pull up     Lower body assist Assist for lower body dressing: Supervision/Verbal cueing     Toileting Toileting    Toileting assist Assist for toileting: Supervision/Verbal cueing     Transfers Chair/bed transfer  Transfers assist     Chair/bed transfer assist level: Supervision/Verbal cueing     Locomotion Ambulation   Ambulation assist      Assist level: Supervision/Verbal cueing Assistive device: Walker-rolling (R AFO) Max distance: >150 ft   Walk 10 feet activity   Assist     Assist level: Supervision/Verbal cueing Assistive device: Walker-rolling (R AFO)   Walk 50 feet activity   Assist Walk 50 feet with 2 turns activity did not occur: Safety/medical concerns  Assist level: Supervision/Verbal cueing Assistive device: Walker-rolling (R AFO)    Walk 150 feet activity   Assist Walk 150 feet activity did not occur: Safety/medical concerns  Assist level: Supervision/Verbal cueing Assistive device: Walker-rolling (R AFO)    Walk 10 feet on uneven surface  activity   Assist Walk 10 feet on uneven surfaces activity did not occur: Safety/medical concerns   Assist level: Supervision/Verbal  cueing Assistive device: Walker-rolling (R AFO)   Wheelchair     Assist Will patient use wheelchair at discharge?: No (only for community mobility if needed & if so, family will propel w/c, otherwise pt ambulating long distances)   Wheelchair activity did not occur: N/A          Wheelchair 50 feet with 2 turns activity    Assist    Wheelchair 50 feet with 2 turns activity did not occur: N/A       Wheelchair 150 feet activity     Assist  Wheelchair 150 feet activity did not occur: N/A       Blood pressure 119/72, pulse 87, temperature 98.8 F (37.1 C), temperature source Oral, resp. rate 16, height 5\' 3"  (1.6 m), weight 87.6 kg, SpO2 99 %.    Medical Problem List and Plan: 1. 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              -patient may shower             -dc home today  -Patient to see me in the office for transitional care encounter in 1-2 weeks.  2.  Antithrombotics: -DVT/anticoagulation:  Pharmaceutical: Lovenox             -antiplatelet therapy: On ASA, Plavix started for 6 months.  3. Pain Management: ?mild right hip contusion d/t recent fall. Pain seems minimal. Well controlled  -continue ice/local care  -activity as tolerated 4. Mood: LCSW to follow for evaluation and support.              -antipsychotic agents: N/A 5. Neuropsych: This patient maybe capable of making decisions on her own behalf. 6. Skin/Wound Care: Routine pressure relief measures.  7. Fluids/Electrolytes/Nutrition: good po intake 8. HTN: Norvasc, Bystolic.   Vitals:   08/10/20 1935 08/11/20 0510  BP: 132/67 119/72  Pulse: 98 87  Resp:  16  Temp: 99.6 F (37.6 C) 98.8 F (37.1 C)  SpO2: 98% 99%  Controlled 8/19 9. GERD: On Protonix.  10. Hyperlipidemia: Trig-1549-->330 11. Hypokalemia:              3.7 8/16 12. Stress induced hyperglycemia: Hgb A1c-5.6.              resolved 13: Neurogenic bladder: On Bactrim x5 days for UTI completed 8/5. Voiding well without dysuria 14. Constipation: On senna, moving bowels      LOS: 22 days A FACE TO FACE EVALUATION WAS PERFORMED  10/5 08/11/2020, 10:28 AM

## 2020-08-11 NOTE — Progress Notes (Signed)
Inpatient Rehabilitation Care Coordinator  Discharge Note  The overall goal for the admission was met for:   Discharge location: Yes. D/c to home with husband.   Length of Stay: Yes. 21 days.   Discharge activity level: Yes. Supervision to Min A at times.   Home/community participation: Yes. Limited.   Services provided included: MD, RD, PT, OT, SLP, RN, CM, TR, Pharmacy, Neuropsych and SW  Financial Services: Private Insurance: [place]  Follow-up services arranged: Outpatient: [facility] for PT/OT/SLP and DME: [employer] for w/c, 3in1 BSC, and RW  Comments (or additional information): contact pt husband Jani Gravel [phone]  Patient/Family verbalized understanding of follow-up arrangements: Yes  Individual responsible for coordination of the follow-up plan: Pt to have assistance with coordinating care needs.   Confirmed correct DME delivered: Rana Snare 08/11/2020    Rana Snare

## 2020-08-11 NOTE — Progress Notes (Signed)
Pt was discharged to home husband spoke with provider questions answered. Discharged via wheelchair.

## 2020-08-12 ENCOUNTER — Ambulatory Visit: Payer: BC Managed Care – PPO | Attending: Physician Assistant

## 2020-08-12 ENCOUNTER — Other Ambulatory Visit: Payer: Self-pay

## 2020-08-12 DIAGNOSIS — R2689 Other abnormalities of gait and mobility: Secondary | ICD-10-CM

## 2020-08-12 DIAGNOSIS — R2681 Unsteadiness on feet: Secondary | ICD-10-CM | POA: Diagnosis present

## 2020-08-12 DIAGNOSIS — I69351 Hemiplegia and hemiparesis following cerebral infarction affecting right dominant side: Secondary | ICD-10-CM | POA: Diagnosis not present

## 2020-08-12 DIAGNOSIS — M6281 Muscle weakness (generalized): Secondary | ICD-10-CM | POA: Insufficient documentation

## 2020-08-12 DIAGNOSIS — R4701 Aphasia: Secondary | ICD-10-CM | POA: Diagnosis present

## 2020-08-12 DIAGNOSIS — M21371 Foot drop, right foot: Secondary | ICD-10-CM | POA: Diagnosis present

## 2020-08-12 NOTE — Therapy (Signed)
[facility] [address] [place], [place], [address] Phone: [phone]   Fax:  [phone]  Physical Therapy Evaluation  Patient Details  Name: Tanya Lopez MRN: [id number] Date of Birth: 11-30-1971 Referring Provider (PT): Mariam Dollar, PA-C. Followed up by: Faith Rogue, MD   Encounter Date: 08/12/2020   PT End of Session - 08/12/20 1011    Visit Number 1    Number of Visits 17    Date for PT Re-Evaluation 11/10/20   POC for 8 weeks, Cert for 90 days   Authorization Type [employer] (30 visit limit between OT/PT)    PT Start Time 0931    PT Stop Time 1014    PT Time Calculation (min) 43 min    Equipment Utilized During Treatment Gait belt    Activity Tolerance Patient tolerated treatment well    Behavior During Therapy WFL for tasks assessed/performed;Flat affect           Past Medical History:  Diagnosis Date  . Hypertension   . PFO (patent foramen ovale) 07/19/2020   Noted on TEE 07/19/2020    Past Surgical History:  Procedure Laterality Date  . IR ANGIO INTRA EXTRACRAN SEL COM CAROTID INNOMINATE BILAT MOD SED  07/17/2020  . IR ANGIO VERTEBRAL SEL VERTEBRAL BILAT MOD SED  07/17/2020  . IR CT HEAD LTD  07/17/2020  . RADIOLOGY WITH ANESTHESIA N/A 07/16/2020   Procedure: IR WITH ANESTHESIA;  Surgeon: Julieanne Cotton, MD;  Location: [facility] OR;  Service: Radiology;  Laterality: N/A;  . TEE WITHOUT CARDIOVERSION N/A 07/19/2020   Procedure: TRANSESOPHAGEAL ECHOCARDIOGRAM (TEE);  Surgeon: Chilton Si, MD;  Location: [facility] ENDOSCOPY;  Service: Cardiovascular;  Laterality: N/A;    There were no vitals filed for this visit.    Subjective Assessment - 08/12/20 0936    Subjective Patient was admitted on 07/16/2020 with right hemiparesis, numbness, dysarthria. Patient was diagnosed with Acute L ACA/MCA CVA. Dr. Roda Shutters felt that stroke was of unknown etiology due to dissection v/s cardiembolic source. Patient was  admitted to inpatient rehab was discharged on 8/19. Patient recieved R AFO in inpatient rehab. Currently ambulating with RW indoors and outdoors. Patient reports difficulty finding her words.    Patient is accompained by: Family member   Husband   Pertinent History PMH: Hypertension, Patient Foramen Ovale    Limitations Walking    Patient Stated Goals To be able to drive; to be able to talk    Currently in Pain? No/denies              [name] PT Assessment - 08/12/20 0001      Assessment   Medical Diagnosis Acute L ACA/MCA CVA    Referring Provider (PT) Mariam Dollar, PA-C. Followed up by: Faith Rogue, MD    Onset Date/Surgical Date 07/16/20    Hand Dominance Right    Prior Therapy Inpatient Rehab      Precautions   Precautions Fall    Required Braces or Orthoses Other Brace/Splint    Other Brace/Splint Patient is currently ambulating with R [place]ok AFO that she recieved from [employer] (worked with Scarlette Slice)       Balance Screen   Has the patient fallen in the past 6 months Yes    How many times? 1   had a fall inpatient rehab   Has the patient had a decrease in activity level because of a fear of falling?  Yes    Is the patient reluctant to leave their  home because of a fear of falling?  No      Home Tourist information centre manager residence    Living Arrangements Spouse/significant other    Available Help at Discharge Family    Type of Home House    Home Access Stairs to enter    Entrance Stairs-Number of Steps 3    Entrance Stairs-Rails None    Home Layout Multi-level    Home Equipment [place] - 2 wheels;Shower seat;Bedside commode;Wheelchair - manual    Additional Comments Patient reports that she stays on first floor, kids live on second floor      Prior Function   Level of Independence Independent    Leisure Photography      Cognition   Overall Cognitive Status Within Functional Limits for tasks assessed      Observation/Other Assessments    Observations Patient presenting with speech deficits, including expressive aphasia. Patient off demonstrating saying yes verbally to a question while shaking her head no.       Sensation   Light Touch Appears Intact    Hot/Cold Appears Intact   per patient report     Coordination   Gross Motor Movements are Fluid and Coordinated No    Fine Motor Movements are Fluid and Coordinated No    Coordination and Movement Description patient demo some difficulty with heel shin test      Tone   Assessment Location Right Lower Extremity      ROM / Strength   AROM / PROM / Strength AROM;Strength      AROM   Overall AROM  Deficits    Overall AROM Comments deficits due to strength      Strength   Overall Strength Deficits    Strength Assessment Site Hip;Knee;Ankle    Right/Left Hip Right;Left    Right Hip Flexion 3-/5    Right Hip ABduction 3-/5    Right Hip ADduction 3-/5    Left Hip Flexion 4/5    Left Hip ABduction 4/5    Left Hip ADduction 4/5    Right/Left Knee Right;Left    Right Knee Flexion 3/5    Right Knee Extension 3-/5    Left Knee Flexion 4/5    Left Knee Extension 4/5    Right/Left Ankle Right;Left    Right Ankle Dorsiflexion 1/5    Left Ankle Dorsiflexion 4/5      Bed Mobility   Bed Mobility Sit to Supine;Supine to Sit   patient/husband reports difficulty due to height of bed   Supine to Sit Minimal Assistance - Patient > 75%   per husband reports needs help getting RLE onto bed   Sit to Supine Minimal Assistance - Patient > 75%   per husband reports needs help getting RLE onto bed     Transfers   Transfers Sit to Stand;Stand to Sit    Sit to Stand 4: Min guard    Sit to Stand Details (indicate cue type and reason) completed from standard height chair    Five time sit to stand comments  15.97 secs from standard height chair with BUE support, RW placed in front for safety    Stand to Sit 4: Min guard      Ambulation/Gait   Ambulation/Gait Yes    Ambulation/Gait  Assistance 4: Min guard    Ambulation Distance (Feet) 115 Feet    Assistive device Rolling walker    Gait Pattern Step-through pattern;Decreased stance time - right;Decreased step length - left;Decreased dorsiflexion -  right;Poor foot clearance - right    Ambulation Surface Level;Indoor    Gait velocity 17.22 secs = 1.90 ft/sec    Gait Comments Patient ambulating with R AFO donned and RW. CGA for safety      Standardized Balance Assessment   Standardized Balance Assessment Timed Up and Go Test      Timed Up and Go Test   TUG Normal TUG    Normal TUG (seconds) 18.56      RLE Tone   RLE Tone Within Functional Limits                      Objective measurements completed on examination: See above findings.               PT Education - 08/12/20 1014    Education Details Educated on POC/evaluation findings    Person(s) Educated Patient;Spouse    Methods Explanation    Comprehension Verbalized understanding            PT Short Term Goals - 08/12/20 1149      PT SHORT TERM GOAL #1   Title Patient will be independent with Initial HEP for strenghtening/balance (All STG due: 09/09/2020)    Baseline no HEP established    Time 4    Period Weeks    Status New    Target Date 09/09/20      PT SHORT TERM GOAL #2   Title Patient will demo ability to ambulate >500 ft on indoor surfaces with LRAD and Supervision    Baseline 100, Min Guard    Time 4    Period Weeks    Status New      PT SHORT TERM GOAL #3   Title Patient will undergo further Balance Assessment of BERG and LTG set    Baseline no baseline established    Time 4    Period Weeks    Status New    Target Date 09/09/20             PT Long Term Goals - 08/12/20 1156      PT LONG TERM GOAL #1   Title Patient will be independent with Final HEP (all LTGs due: 10/07/2020)    Baseline no HEP established    Time 8    Period Weeks    Status New    Target Date 10/07/20      PT LONG TERM GOAL  #2   Title Patient will improve Berg Balance by 5 points from baseline to demonstrate improved balance and reduced fall risk    Baseline baseline not established    Time 8    Period Weeks    Status New    Target Date 10/07/20      PT LONG TERM GOAL #3   Title Patient will demonsrate ability to ascend/descend stairs 4 stairs at supervision level without UE support    Baseline ascend/descend with RW, Min Guard    Time 8    Period Weeks    Status New    Target Date 10/07/20      PT LONG TERM GOAL #4   Title Patient will improve TUG <12 seconds with LRAD to demo reduced fall risk    Baseline 18.56 secs    Time 8    Period Weeks    Status New    Target Date 10/07/20      PT LONG TERM GOAL #5   Title Patient will improve 5x sit<> stand  to <12 seconds with UE support to demonstrate improved functional strength and mobility    Baseline 15.97 secs    Time 8    Period Weeks    Status New    Target Date 10/07/20      Additional Long Term Goals   Additional Long Term Goals Yes      PT LONG TERM GOAL #6   Title Patient will improve gait speed to 2.5 ft/sec to demonstrate improved community ambulation    Baseline 1.90 ft/sec    Time 8    Period Weeks    Status New                  Plan - 08/12/20 1141    Clinical Impression Statement Patient is a 49 y.o. female that was referred to Neuro OPPT for acute L ACA and MCA CVA. Patient was admitted on 07/16/2020 with right hemiparesis. Stroke was of unknown etiology, but patient PMH is significant for the following: hypertension, patent foramen ovale. Patient received inpatient rehab services and discharged home on 08/12/19. Patient is currently experiencing aphasia. Patient demonstrates the following impairments upon evaluation: decreased strength, foot drop on RLE, abnormal gait, decreased balance, decreased functional mobility, and increased risk for falls. Patient is currently ambulating with RW at a gait speed of 1.90 ft/sec  demonstrating limited community ambulator. With TUG time of 18.56 secs with RW, demonstrates patient is at an increased risk for falls. Patient will benefit from skilled PT services to address impairments and maximize functional mobility.    Personal Factors and Comorbidities Comorbidity 2    Comorbidities HTN, Patent Foramen Ovale    Examination-Activity Limitations Stairs;Transfers;Stand;Bed Mobility    Examination-Participation Restrictions Community Activity;Driving;Yard Work    Conservation officer, historic buildingstability/Clinical Decision Making Evolving/Moderate complexity    Clinical Decision Making Moderate    Rehab Potential Good    PT Frequency 2x / week    PT Duration 8 weeks    PT Treatment/Interventions ADLs/Self Care Home Management;Aquatic Therapy;Electrical Stimulation;Moist Heat;DME Instruction;Gait training;Stair training;Functional mobility training;Therapeutic activities;Therapeutic exercise;Balance training;Neuromuscular re-education;Patient/family education;Orthotic Fit/Training;Manual techniques;Passive range of motion;Energy conservation    PT Next Visit Plan Assess Programmer, systemsBerg Balance, Initiate HEP    Recommended Other Services Speech/Occupational Therapy    Consulted and Agree with Plan of Care Patient;Family member/caregiver    Family Member Consulted Husband           Patient will benefit from skilled therapeutic intervention in order to improve the following deficits and impairments:  Abnormal gait, Decreased balance, Decreased endurance, Decreased mobility, Difficulty walking, Decreased range of motion, Decreased activity tolerance, Decreased knowledge of use of DME, Decreased coordination, Decreased strength  Visit Diagnosis: Hemiplegia and hemiparesis following cerebral infarction affecting right dominant side (HCC)  Muscle weakness (generalized)  Unsteadiness on feet  Other abnormalities of gait and mobility  Foot drop, right     Problem List Patient Active Problem List   Diagnosis Date  Noted  . SAH (subarachnoid hemorrhage) (HCC) - L frontal parafalcine, present on admission w/ infarcts 07/20/2020  . 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 Essential hypertension 07/20/2020  . Hyperlipidemia 07/20/2020  . Urinary retention 07/20/2020  . Obesity 07/20/2020  . Hypokalemia 07/20/2020  . Acute ischemic left MCA stroke (HCC) 07/20/2020  . Hyperglycemia   . PFO (patent foramen ovale) 07/19/2020  . Acute L ACA, R cerebellar, L temporoparietal infarcts (HCC)  07/17/2020  . Neurologic abnormality   . Stroke (cerebrum) (HCC) 07/16/2020    Tempie DonningKaitlyn B Lazara Grieser,  PT, DPT 08/12/2020, 12:02 PM  [facility] [address] [place], [place], [address] Phone: [phone]   Fax:  [phone]  Name: ALYA SMALTZ MRN: [id number] Date of Birth: 28-Feb-1971

## 2020-08-15 ENCOUNTER — Other Ambulatory Visit: Payer: Self-pay

## 2020-08-15 ENCOUNTER — Ambulatory Visit: Payer: BC Managed Care – PPO | Admitting: Speech Pathology

## 2020-08-15 ENCOUNTER — Ambulatory Visit: Payer: BC Managed Care – PPO

## 2020-08-15 DIAGNOSIS — I69351 Hemiplegia and hemiparesis following cerebral infarction affecting right dominant side: Secondary | ICD-10-CM | POA: Diagnosis not present

## 2020-08-15 DIAGNOSIS — M21371 Foot drop, right foot: Secondary | ICD-10-CM

## 2020-08-15 DIAGNOSIS — R4701 Aphasia: Secondary | ICD-10-CM

## 2020-08-15 DIAGNOSIS — R2689 Other abnormalities of gait and mobility: Secondary | ICD-10-CM

## 2020-08-15 DIAGNOSIS — M6281 Muscle weakness (generalized): Secondary | ICD-10-CM

## 2020-08-15 DIAGNOSIS — R2681 Unsteadiness on feet: Secondary | ICD-10-CM

## 2020-08-15 NOTE — Patient Instructions (Signed)
Access Code: [email or web address] URL: [email or web address] Date: 08/15/2020 Prepared by: Jethro Bastos  Exercises Sit to Stand - 1 x daily - 5 x weekly - 3 sets - 10 reps Seated March - 1 x daily - 5 x weekly - 3 sets - 10 reps Supine Bridge - 1 x daily - 5 x weekly - 3 sets - 10 reps Clamshell - 1 x daily - 5 x weekly - 3 sets - 10 reps Seated Toe Raise - 1 x daily - 5 x weekly - 2 sets - 10 reps

## 2020-08-15 NOTE — Therapy (Signed)
[facility] [address] [place], [place], [address] Phone: [phone]   Fax:  [phone]  Speech Language Pathology Treatment  Patient Details  Name: Tanya Lopez MRN: [id number] Date of Birth: [birth year] Referring Provider (SLP): Dr. Faith Rogue   Encounter Date: 08/15/2020   End of Session - 08/15/20 1546    Visit Number 1    Number of Visits 17    Date for SLP Re-Evaluation 10/10/20    SLP Start Time 1232    SLP Stop Time  1312    SLP Time Calculation (min) 40 min    Activity Tolerance Patient tolerated treatment well           Past Medical History:  Diagnosis Date  . Hypertension   . PFO (patent foramen ovale) 07/19/2020   Noted on TEE 07/19/2020    Past Surgical History:  Procedure Laterality Date  . IR ANGIO INTRA EXTRACRAN SEL COM CAROTID INNOMINATE BILAT MOD SED  07/17/2020  . IR ANGIO VERTEBRAL SEL VERTEBRAL BILAT MOD SED  07/17/2020  . IR CT HEAD LTD  07/17/2020  . RADIOLOGY WITH ANESTHESIA N/A 07/16/2020   Procedure: IR WITH ANESTHESIA;  Surgeon: Julieanne Cotton, MD;  Location: [facility] OR;  Service: Radiology;  Laterality: N/A;  . TEE WITHOUT CARDIOVERSION N/A 07/19/2020   Procedure: TRANSESOPHAGEAL ECHOCARDIOGRAM (TEE);  Surgeon: Chilton Si, MD;  Location[facility] ENDOSCOPY;  Service: Cardiovascular;  Laterality: N/A;    There were no vitals filed for this visit.      SLP Evaluation [facility] - 08/15/20 1257      SLP Visit Information   SLP Received On 08/15/20    Referring Provider (SLP) Dr. Faith Rogue    Onset Date 07/16/20    Medical Diagnosis Left MCA CVA      Subjective   Subjective "Stroke"    Patient/Family Stated Goal Talking smoother      Pain Assessment   Currently in Pain? No/denies      General Information   HPI Patient was admitted on 07/16/2020 with right hemiparesis, numbness, dysarthria. Patient was diagnosed with Acute L ACA/MCA CVA. Dr. Roda Shutters felt that stroke  was of unknown etiology due to dissection v/s cardiembolic source. Patient was admitted to inpatient rehab was discharged on 8/19. Patient recieved R AFO in inpatient rehab.  PFO found    Mobility Status uses walker - on PT caseload      Balance Screen   Has the patient fallen in the past 6 months Yes    How many times? 1x   in hospital   Has the patient had a decrease in activity level because of a fear of falling?  No    Is the patient reluctant to leave their home because of a fear of falling?  No      Prior Functional Status   Cognitive/Linguistic Baseline Within functional limits    Type of Home House     Lives With Spouse;Family    Available Support Family    Education --   PhD   Vocation Self employed      Cognition   Overall Cognitive Status Within Functional Limits for tasks assessed      Auditory Comprehension   Overall Auditory Comprehension Appears within functional limits for tasks assessed    Yes/No Questions Within Functional Limits    Commands Within Functional Limits    Conversation Simple    EffectiveTechniques Extra processing time      Reading Comprehension  Reading Status Impaired    Word level 76-100% accurate    Sentence Level 76-100% accurate    Paragraph Level 76-100% accurate    Functional Environmental (signs, name badge) Within functional limits      Expression   Primary Mode of Expression Verbal      Verbal Expression   Overall Verbal Expression Impaired    Initiation No impairment    Automatic Speech Counting;Day of week    Level of Generative/Spontaneous Verbalization Conversation    Repetition No impairment    Naming Impairment    Responsive 76-100% accurate    Confrontation 75-100% accurate    Convergent 75-100% accurate    Divergent 50-74% accurate    Verbal Errors Phonemic paraphasias    Pragmatics No impairment    Effective Techniques Open ended questions;Semantic cues;Written cues;Sentence completion;Phonemic cues      Written  Expression   Dominant Hand Right    Written Expression Exceptions to [facility]    Dictation Ability Sentence    Self Formulation Ability Sentence      Oral Motor/Sensory Function   Overall Oral Motor/Sensory Function Impaired    Labial ROM Reduced right    Labial Symmetry Abnormal symmetry right    Labial Strength Within Functional Limits    Labial Sensation Within Functional Limits    Labial Coordination WFL    Lingual ROM Within Functional Limits    Lingual Symmetry Within Functional Limits    Lingual Strength Within Functional Limits    Lingual Sensation Within Functional Limits    Lingual Coordination WFL    Facial ROM Within Functional Limits    Velum Within Functional Limits    Mandible Within Functional Limits      Motor Speech   Overall Motor Speech Appears within functional limits for tasks assessed      Standardized Assessments   Standardized Assessments  Western Aphasia Battery revised    Western Aphasia Battery revised  93.4/100   anomic aphasia               SLP Education - 08/15/20 1546    Education Details compensations for aphasia    Person(s) Educated Patient;Spouse    Methods Explanation;Demonstration;Verbal cues;Handout    Comprehension Verbalized understanding;Verbal cues required;Need further instruction            SLP Short Term Goals - 08/15/20 1555      SLP SHORT TERM GOAL #1   Title Pt will name 10 items in personally relevant categorie swith rare min A    Time 4    Period Weeks    Status New      SLP SHORT TERM GOAL #2   Title Pt will utilize compensations for anomia in structured naming tasks with rare min A over 2 sessions    Time 4    Period Weeks    Status New      SLP SHORT TERM GOAL #3   Title Pt will ID and correct written errors at sentence level with rare min A over 2 sessions    Time 8    Period Weeks    Status New            SLP Long Term Goals - 08/15/20 1556      SLP LONG TERM GOAL #1   Title Pt will utilize  compensations for aphasia as needed in 20 minute mildly complex conversation with mod I over 2 sessions    Time 8    Period Weeks  Status New      SLP LONG TERM GOAL #2   Title Pt will write or email 2-3 paragraph message self correcting errors with rare min A over 2 sessions    Time 8    Period Weeks    Status New      SLP LONG TERM GOAL #3   Title Pt will complete complex naming tasks with 90% accuracy over 2 sessions rare min A    Time 8    Period Weeks    Status New            Plan - 08/15/20 1547    Clinical Impression Statement Tanya Lopez is referred for outpt ST for aphasia s/p CVA. Prior to CVA, she was independent with all IADL's. She has a PhD in [employer] and was working as a Environmental manager prior to CVA. She enjoys golf. She is accompanied by her spouse, Fayrene Fearing. Pamla has a 37 yo daughter and 61 yo daughter. 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 Reading and auditory comprehension releatively intact. She named 8 items in simple category. (20 in 1 minuteis WNL) Aphasia affects her ability to return to work and communicate within the community. Kynslei verbalizes frustration and demonstrates some anxiety with anomic episodes. A this time, she is not using compensations for aphasia. I reviewed these with her I recommend skilled ST to maximize communication for independence, QOL and safety.    Speech Therapy Frequency 2x / week    Duration --   8 weeks or 17 visits   Treatment/Interventions Language facilitation;Environmental controls;Cueing hierarchy;SLP instruction and feedback;Compensatory strategies;Functional tasks;Compensatory techniques;Patient/family education;Multimodal communcation approach;Internal/external aids           Patient will benefit from skilled therapeutic intervention in order to improve the following deficits and  impairments:   Aphasia    Problem List Patient Active Problem List   Diagnosis Date Noted  . SAH (subarachnoid hemorrhage) (HCC) - L frontal parafalcine, present on admission w/ infarcts 07/20/2020  . 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 Essential hypertension 07/20/2020  . Hyperlipidemia 07/20/2020  . Urinary retention 07/20/2020  . Obesity 07/20/2020  . Hypokalemia 07/20/2020  . Acute ischemic left MCA stroke (HCC) 07/20/2020  . Hyperglycemia   . PFO (patent foramen ovale) 07/19/2020  . Acute L ACA, R cerebellar, L temporoparietal infarcts (HCC)  07/17/2020  . Neurologic abnormality   . Stroke (cerebrum) (HCC) 07/16/2020    Brigitta Pricer, Radene Journey MS, CCC-SLP 08/15/2020, 3:59 PM   [facility] [address] [place], [place], [address] Phone: [phone]   Fax:  [phone]   Name: Tanya Lopez MRN: [id number] Date of Birth: May 14, 1971

## 2020-08-15 NOTE — Therapy (Signed)
[facility] [address] [place], [place], [address] Phone: [phone]   Fax:  [phone]  Physical Therapy Treatment  Patient Details  Name: Tanya Lopez MRN: [id number] Date of Birth: 08-12-1971 Referring Provider (PT): Mariam Dollar, PA-C. Followed up by: Faith Rogue, MD   Encounter Date: 08/15/2020   PT End of Session - 08/15/20 1020    Visit Number 2    Number of Visits 17    Date for PT Re-Evaluation 11/10/20   POC for 8 weeks, Cert for 90 days   Authorization Type [employer] (30 visit limit between OT/PT)    PT Start Time 1017    PT Stop Time 1058    PT Time Calculation (min) 41 min    Equipment Utilized During Treatment Gait belt    Activity Tolerance Patient tolerated treatment well    Behavior During Therapy WFL for tasks assessed/performed;Flat affect           Past Medical History:  Diagnosis Date   Hypertension    PFO (patent foramen ovale) 07/19/2020   Noted on TEE 07/19/2020    Past Surgical History:  Procedure Laterality Date   IR ANGIO INTRA EXTRACRAN SEL COM CAROTID INNOMINATE BILAT MOD SED  07/17/2020   IR ANGIO VERTEBRAL SEL VERTEBRAL BILAT MOD SED  07/17/2020   IR CT HEAD LTD  07/17/2020   RADIOLOGY WITH ANESTHESIA N/A 07/16/2020   Procedure: IR WITH ANESTHESIA;  Surgeon: Julieanne Cotton, MD;  Location: [facility] OR;  Service: Radiology;  Laterality: N/A;   TEE WITHOUT CARDIOVERSION N/A 07/19/2020   Procedure: TRANSESOPHAGEAL ECHOCARDIOGRAM (TEE);  Surgeon: Chilton Si, MD;  Location: [facility] ENDOSCOPY;  Service: Cardiovascular;  Laterality: N/A;    There were no vitals filed for this visit.   Subjective Assessment - 08/15/20 1019    Subjective Patient reports no new complaints since last visit. No pain. No falls.    Patient is accompained by: Family member   Husband   Pertinent History PMH: Hypertension, Patient Foramen Ovale    Limitations Walking    Patient Stated Goals To  be able to drive; to be able to talk    Currently in Pain? No/denies                             [facility] Adult PT Treatment/Exercise - 08/15/20 0001      Transfers   Transfers Sit to Stand;Stand to Sit    Sit to Stand 5: Supervision    Stand to Sit 5: Supervision    Comments completed sit<> stand x 10 reps from mat with RLE positioned sligthly posterior.       Ambulation/Gait   Ambulation/Gait Yes    Ambulation/Gait Assistance 4: Min guard;5: Supervision    Ambulation/Gait Assistance Details Completed initial gait trianing with RW, with patient ambulating at supervision level. Progressed to completing ambulation without AD x 230 with PT providing CGA and providing verbal cues for improved step length.    Ambulation Distance (Feet) 115 Feet   230   Assistive device Rolling walker;None    Gait Pattern Step-through pattern;Decreased stance time - right;Decreased step length - left;Decreased dorsiflexion - right;Poor foot clearance - right    Ambulation Surface Level;Indoor    Gait Comments All ambulation completed with R AFO donned. PT provided tennis ball and applied to RW for improved propulsion.       Standardized Balance Assessment   Standardized Balance Assessment Berg Balance  Test      Berg Balance Test   Sit to Stand Able to stand without using hands and stabilize independently    Standing Unsupported Able to stand safely 2 minutes    Sitting with Back Unsupported but Feet Supported on Floor or Stool Able to sit safely and securely 2 minutes    Stand to Sit Sits safely with minimal use of hands    Transfers Able to transfer safely, definite need of hands    Standing Unsupported with Eyes Closed Able to stand 10 seconds with supervision    Standing Ubsupported with Feet Together Able to place feet together independently and stand for 1 minute with supervision    From Standing, Reach Forward with Outstretched Arm Can reach forward >12 cm safely (5")    From  Standing Position, Pick up Object from Floor Able to pick up shoe, needs supervision    From Standing Position, Turn to Look Behind Over each Shoulder Looks behind one side only/other side shows less weight shift    Turn 360 Degrees Able to turn 360 degrees safely but slowly    Standing Unsupported, Alternately Place Feet on Step/Stool Able to stand independently and complete 8 steps >20 seconds    Standing Unsupported, One Foot in Front Able to take small step independently and hold 30 seconds    Standing on One Leg Able to lift leg independently and hold 5-10 seconds    Total Score 44      Exercises   Exercises Other Exercises    Other Exercises  Established initial HEP focused on BLE strenghtening              Access Code: [email or web address] URL: [email or web address] Date: 08/15/2020 Prepared by: Jethro BastosKaitlyn Milania Haubner  Exercises Sit to Stand - 1 x daily - 5 x weekly - 3 sets - 10 reps Seated March - 1 x daily - 5 x weekly - 3 sets - 10 reps Supine Bridge - 1 x daily - 5 x weekly - 3 sets - 10 reps Clamshell - 1 x daily - 5 x weekly - 3 sets - 10 reps Seated Toe Raise - 1 x daily - 5 x weekly - 2 sets - 10 reps     PT Education - 08/15/20 1055    Education Details Educated on [employer] CompanyBerg Balance results; Initial HEP    Person(s) Educated Patient;Spouse    Methods Explanation;Demonstration;Handout    Comprehension Verbalized understanding;Returned demonstration;Need further instruction            PT Short Term Goals - 08/15/20 1202      PT SHORT TERM GOAL #1   Title Patient will be independent with Initial HEP for strenghtening/balance (All STG due: 09/09/2020)    Baseline no HEP established    Time 4    Period Weeks    Status New    Target Date 09/09/20      PT SHORT TERM GOAL #2   Title Patient will demo ability to ambulate >500 ft on indoor surfaces with LRAD and Supervision    Baseline 100, Min Guard    Time 4    Period Weeks    Status New      PT SHORT TERM GOAL  #3   Title Patient will undergo further Balance Assessment of BERG and LTG set    Baseline 44/56 on 8/23    Time 4    Period Weeks    Status Achieved    Target Date 09/09/20  PT Long Term Goals - 08/15/20 1203      PT LONG TERM GOAL #1   Title Patient will be independent with Final HEP (all LTGs due: 10/07/2020)    Baseline no HEP established    Time 8    Period Weeks    Status New      PT LONG TERM GOAL #2   Title Patient will improve Berg Balance by 5 points from baseline to demonstrate improved balance and reduced fall risk    Baseline 44/56 on 8/23    Time 8    Period Weeks    Status New      PT LONG TERM GOAL #3   Title Patient will demonsrate ability to ascend/descend stairs 4 stairs at supervision level without UE support    Baseline ascend/descend with RW, Min Guard    Time 8    Period Weeks    Status New      PT LONG TERM GOAL #4   Title Patient will improve TUG <12 seconds with LRAD to demo reduced fall risk    Baseline 18.56 secs    Time 8    Period Weeks    Status New      PT LONG TERM GOAL #5   Title Patient will improve 5x sit<> stand to <12 seconds with UE support to demonstrate improved functional strength and mobility    Baseline 15.97 secs    Time 8    Period Weeks    Status New      PT LONG TERM GOAL #6   Title Patient will improve gait speed to 2.5 ft/sec to demonstrate improved community ambulation    Baseline 1.90 ft/sec    Time 8    Period Weeks    Status New                 Plan - 08/15/20 1203    Clinical Impression Statement Today's skilled PT session included further assessment of balance with completion of Berg Balance Test, patient demonstrating increased fall risk with score of 44/56. Rest of session spent establishing initial HEP focused on strengthening exercises, specifically targeting RLE. Completed gait training without AD today, with patient require CGA and verbal cues for improved step length. Patient  will benefit from skilled PT services to progress toward all goals.    Personal Factors and Comorbidities Comorbidity 2    Comorbidities HTN, Patent Foramen Ovale    Examination-Activity Limitations Stairs;Transfers;Stand;Bed Mobility    Examination-Participation Restrictions Community Activity;Driving;Yard Work    Conservation officer, historic buildings Evolving/Moderate complexity    Rehab Potential Good    PT Frequency 2x / week    PT Duration 8 weeks    PT Treatment/Interventions ADLs/Self Care Home Management;Aquatic Therapy;Electrical Stimulation;Moist Heat;DME Instruction;Gait training;Stair training;Functional mobility training;Therapeutic activities;Therapeutic exercise;Balance training;Neuromuscular re-education;Patient/family education;Orthotic Fit/Training;Manual techniques;Passive range of motion;Energy conservation    PT Next Visit Plan How is HEP? gait training without AD. Work on [employer]. RLE strengthening    Consulted and Agree with Plan of Care Patient;Family member/caregiver    Family Member Consulted Husband           Patient will benefit from skilled therapeutic intervention in order to improve the following deficits and impairments:  Abnormal gait, Decreased balance, Decreased endurance, Decreased mobility, Difficulty walking, Decreased range of motion, Decreased activity tolerance, Decreased knowledge of use of DME, Decreased coordination, Decreased strength  Visit Diagnosis: Hemiplegia and hemiparesis following cerebral infarction affecting right dominant side (HCC)  Muscle weakness (generalized)  Other abnormalities of gait and mobility  Unsteadiness on feet  Foot drop, right     Problem List Patient Active Problem List   Diagnosis Date Noted   SAH (subarachnoid hemorrhage) (HCC) - L frontal parafalcine, present on admission w/ infarcts 07/20/2020   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 [place], [place], [address] Phone: [phone]   Fax:  [phone]  Name: Tanya Lopez MRN: [id number] Date of Birth: Jun 05, 1971

## 2020-08-15 NOTE — Patient Instructions (Addendum)
  Tips for Talking with People who have Aphasia  . Say one thing at a time . Don't  rush - slow down, be patient . Talk face to face . Reduce background noise . Relax - be natural . Use pen and paper . Write down key words . Draw diagrams or pictures . Don't pretend you understand . Ask what helps . Recap - check you both understand . Be a partner, not a therapist   Aphasia does not affect intelligence, only language. The person with aphasia can still: make decisions, have opinions, and socialize.   Describing words  What group does it belong to?  What do I use it for?  Where can I find it?  What does it LOOK like?  What other words go with it?  What is the 1st sound of the word?          Many Ways to Communicate  Describe it Write it Draw it Gesture it Use related words  Provided by: Rolin Barry ST, [phone]

## 2020-08-16 NOTE — Discharge Summary (Signed)
Physician Discharge Summary  Patient ID: TAUSHA MILHOAN MRN: [id number] DOB/AGE: [birth year] 49 y.o.  Admit date: 07/20/2020 Discharge date: 08/11/2020  Discharge Diagnoses:  Principal Problem:   Acute L ACA, R cerebellar, L temporoparietal infarcts (HCC)  Active Problems:   PFO (patent foramen ovale)   Essential hypertension   Hyperlipidemia   Hyperglycemia   Discharged Condition: stable   Significant Diagnostic Studies: N/A   Labs:  Basic Metabolic Panel: BMP Latest Ref Rng & Units 08/08/2020 08/01/2020 07/25/2020  Glucose 70 - 99 mg/dL 500(X) 381(W) 299(B)  BUN 6 - 20 mg/dL 15 18 12   Creatinine 0.44 - 1.00 mg/dL 7.16 9.67  Sodium 135 - 145 mmol/L 139 137 137  Potassium 3.5 - 5.1 mmol/L 3.7 3.8 3.6  Chloride 98 - 111 mmol/L 105 104 106  CO2 22 - 32 mmol/L 22 22 23   Calcium 8.9 - 10.3 mg/dL 9.4 9.5 9.1    CBC: CBC Latest Ref Rng & Units 08/08/2020 08/01/2020 07/25/2020  WBC 4.0 - 10.5 K/uL 6.6 6.6 6.4  Hemoglobin 12.0 - 15.0 g/dL 11.5(L) 12.3 11.9(L)  Hematocrit 36 - 46 % 37.1 39.3 38.3  Platelets 150 - 400 K/uL 295 354 347    CBG: No results for input(s): GLUCAP in the last 168 hours.  Brief HPI:   DASHAWN GOLDA is a 49 y.o. female with history of HTN who was admitted on 07/16/2020 with right sided weakness, numbness and dysarthria.  CT head showed left frontal SAH and medial posterior left frontal lobe question of venous hemorrhage therefore was not a TPA candidate.  CTA head showed left A2 segment occlusion with incomplete filling of left frontal cortical vein question cortical vein thrombosis.  She underwent cerebral angio revealing small narrowing the left-ACA 2 branches suspicious for dissection and no intraluminal filling defects noted.  MRI/MRA brain done revealing moderate left-ACA infarct, small acute right cerebellar, left occipital and right thalamic infarcts with improvement in L-A2 flow.  She was started on ASA with recommendations to start DAPT in  3 days.  Neurology felt stroke was of unknown etiology due to dissection versus cardioembolic stroke.  TEE showed EF of 65 to 70% and was positive for PFO with right-to-left shunting.  BLE Dopplers were negative for DVT.  Patient with resultant expressive aphasia with minimal verbal output and right hemiparesis affecting ADLs and mobility.  CIR was recommended due to functional decline.     Hospital Course: MIKIALA FUGETT was admitted to rehab 07/20/2020 for inpatient therapies to consist of PT, ST and OT at least three hours five days a week. Past admission physiatrist, therapy team and rehab RN have worked together to provide customized collaborative inpatient rehab.  Plavix was added on 07/29 and neurology recommended at least 6 months treatment depending PFO closure.   Her blood pressures were monitored on TID basis and have been controlled. Foley was removed and she was started on bladder training. She was found to have E coli UTI treated which was treated with five day course of bactrim.    CBC showed mild drop in H/H. WBC and platelets are stable. Check of BMET showed lytes and renal status to be WNL. Her constipation has been managed with use of Senna. Hypokalemia has resolved with brief supplementation.  Mood has been stable and po intake has been good. She is continent of bladder and is voiding without difficulty. Her verbal output is improving as well as motor return on RUE/RLE. AFO ordered to help with foot  drop and gait. She has progressed to supervision level. She will continue to receive follow up outpatient PT, OT and ST at [facility] after discharge.   Rehab course: During patient's stay in rehab weekly team conferences were held to monitor patient's progress, set goals and discuss barriers to discharge. At admission, patient required max assist with ADL task and with mobility. She exhibited moderate expressive aphasia with limited verbal output and basic cognitive skills appear  to be [facility]. She  has had improvement in activity tolerance, balance, postural control as well as ability to compensate for deficits. She has had improvement in functional use RUE  and RLE as well as improvement in awareness.  She is able to complete  ADL tasks at modified independent level. She is requires supervision for transfers and for mobility.  She requires supervision with min verbal cues and extra time for verbal expression of midly complex information and is at supervision for overall for verbal expression at word and sentence level. Family education was completed regarding all aspects of care and safety.   Discharge disposition: 01-Home or Self Care  Diet: Heart Healthy.   Special Instructions: 1. Needs supervision with all activity.  2.  Recommend repeat CBC in 2 weeks to monitor H/H/platelets while on DAPT   Discharge Instructions    Ambulatory Referral to Neuro Rehab   Complete by: As directed    Evaluate and treat physical therapy occupational therapy and speech therapy   Ambulatory referral to Neurology   Complete by: As directed    An appointment is requested in approximately left greater than right brain infarcts   Ambulatory referral to Physical Medicine Rehab   Complete by: As directed    Moderate complexity follow-up 1 to 2 weeks left greater than right brain infarcts     Allergies as of 08/11/2020   No Known Allergies     Medication List    STOP taking these medications   heparin 5000 UNIT/ML injection   mupirocin ointment 2 % Commonly known as: BACTROBAN   potassium chloride SA 20 MEQ tablet Commonly known as: KLOR-CON   sodium chloride 0.9 % infusion     TAKE these medications   acetaminophen 325 MG tablet Commonly known as: TYLENOL Take 2 tablets (650 mg total) by mouth every 4 (four) hours as needed for mild pain (or temp > 37.5 C (99.5 F)).   amLODipine 10 MG tablet Commonly known as: NORVASC Take 1 tablet (10 mg total) by mouth daily.    aspirin 325 MG EC tablet Take 1 tablet (325 mg total) by mouth daily.   atorvastatin 80 MG tablet Commonly known as: LIPITOR Take 1 tablet (80 mg total) by mouth daily.   clopidogrel 75 MG tablet Commonly known as: PLAVIX Take 1 tablet (75 mg total) by mouth daily.   nebivolol 5 MG tablet Commonly known as: BYSTOLIC Take 1 tablet (5 mg total) by mouth daily.   pantoprazole 40 MG tablet Commonly known as: PROTONIX Take 1 tablet (40 mg total) by mouth daily.       Follow-up Information    Ranelle Oyster, MD Follow up.   Specialty: Physical Medicine and Rehabilitation Why: Office to call for appointment Contact information: [address] [place] [address] [phone]               Signed: Jacquelynn Cree 08/16/2020, 4:11 PM

## 2020-08-17 ENCOUNTER — Ambulatory Visit: Payer: BC Managed Care – PPO | Admitting: Physical Therapy

## 2020-08-17 ENCOUNTER — Other Ambulatory Visit (HOSPITAL_COMMUNITY): Payer: Self-pay | Admitting: Interventional Radiology

## 2020-08-17 ENCOUNTER — Other Ambulatory Visit: Payer: Self-pay

## 2020-08-17 DIAGNOSIS — I63522 Cerebral infarction due to unspecified occlusion or stenosis of left anterior cerebral artery: Secondary | ICD-10-CM

## 2020-08-17 DIAGNOSIS — I69351 Hemiplegia and hemiparesis following cerebral infarction affecting right dominant side: Secondary | ICD-10-CM

## 2020-08-17 DIAGNOSIS — R2681 Unsteadiness on feet: Secondary | ICD-10-CM

## 2020-08-17 DIAGNOSIS — M21371 Foot drop, right foot: Secondary | ICD-10-CM

## 2020-08-17 DIAGNOSIS — M6281 Muscle weakness (generalized): Secondary | ICD-10-CM

## 2020-08-17 DIAGNOSIS — R2689 Other abnormalities of gait and mobility: Secondary | ICD-10-CM

## 2020-08-17 NOTE — Therapy (Signed)
[facility] [address] [place], [place], [address] Phone: [phone]   Fax:  [phone]  Physical Therapy Treatment  Patient Details  Name: Tanya Lopez MRN: [id number] Date of Birth: 07-22-1971 Referring Provider (PT): Mariam Dollar, PA-C. Followed up by: Faith Rogue, MD   Encounter Date: 08/17/2020   PT End of Session - 08/17/20 1538    Visit Number 3    Number of Visits 17    Date for PT Re-Evaluation 11/10/20   POC for 8 weeks, Cert for 90 days   Authorization Type [employer] (30 visit limit between OT/PT)    PT Start Time 1535    PT Stop Time 1615    PT Time Calculation (min) 40 min    Equipment Utilized During Treatment Gait belt    Activity Tolerance Patient tolerated treatment well    Behavior During Therapy WFL for tasks assessed/performed;Flat affect           Past Medical History:  Diagnosis Date  . Hypertension   . PFO (patent foramen ovale) 07/19/2020   Noted on TEE 07/19/2020    Past Surgical History:  Procedure Laterality Date  . IR ANGIO INTRA EXTRACRAN SEL COM CAROTID INNOMINATE BILAT MOD SED  07/17/2020  . IR ANGIO VERTEBRAL SEL VERTEBRAL BILAT MOD SED  07/17/2020  . IR CT HEAD LTD  07/17/2020  . RADIOLOGY WITH ANESTHESIA N/A 07/16/2020   Procedure: IR WITH ANESTHESIA;  Surgeon: Julieanne Cotton, MD;  Location: [facility] OR;  Service: Radiology;  Laterality: N/A;  . TEE WITHOUT CARDIOVERSION N/A 07/19/2020   Procedure: TRANSESOPHAGEAL ECHOCARDIOGRAM (TEE);  Surgeon: Chilton Si, MD;  Location: [facility] ENDOSCOPY;  Service: Cardiovascular;  Laterality: N/A;    There were no vitals filed for this visit.   Subjective Assessment - 08/17/20 1537    Subjective No new complaitns. No falls or pain to report. HEP is going well.    Patient is accompained by: Family member   spouse in lobby   Pertinent History PMH: Hypertension, Patient Foramen Ovale    Limitations Walking    Patient Stated Goals  To be able to drive; to be able to talk    Currently in Pain? No/denies    Pain Score 0-No pain                [facility] Adult PT Treatment/Exercise - 08/17/20 1538      Transfers   Transfers Sit to Stand;Stand to Sit    Sit to Stand 5: Supervision;With upper extremity assist;Without upper extremity assist;From bed;From chair/3-in-1    Stand to Sit 5: Supervision;With upper extremity assist;Without upper extremity assist;To bed;To chair/3-in-1      Ambulation/Gait   Ambulation/Gait Yes    Ambulation/Gait Assistance 4: Min guard;5: Supervision    Ambulation/Gait Assistance Details min guard assist with no AD with cues for increased right hip/knee flexion for heel stike as heel tends to drag with swing phase    Ambulation Distance (Feet) 230 Feet   x1 no AD, use of RW to enter/exit gym   Assistive device Rolling walker;None    Gait Pattern Step-through pattern;Decreased stance time - right;Decreased step length - left;Decreased dorsiflexion - right;Poor foot clearance - right    Ambulation Surface Level;Indoor      Neuro Re-ed    Neuro Re-ed Details  for strengthening/muscle re-ed: seated at edge of mat with left foot on 2 inch box, then on foam bubble for sit<>stands x 10 reps each with hand on knees, cues  for full standing and slow, controlled descent;       Exercises   Exercises Other Exercises    Other Exercises  for right ankle strengthening: red band assist DF/resisted PF for 2 sets of 10 reps; with fitter board- ant/post taps with AA needed for full available range for 10 reps.                Balance Exercises - 08/17/20 1603      Balance Exercises: Standing   Standing Eyes Closed Narrow base of support (BOS);Wide (BOA);Head turns;Foam/compliant surface;Other reps (comment);30 secs;Limitations    Standing Eyes Closed Limitations on airex with no UE support: feet together for EC no head movements for 30 sec's x 3 reps, then with feet hip width apart- EC head movements  left<>right, then up<>down  for ~10 reps each. min guard to min assist with posterior postural sway noted.     Partial Tandem Stance Eyes open;Eyes closed;Foam/compliant surface;30 secs;Limitations    Partial Tandem Stance Limitations on airex with no UE support, EO progressing to [facility] for 1 rep each foot forward with EO and EC. min guard to min assist for balance.                PT Short Term Goals - 08/15/20 1202      PT SHORT TERM GOAL #1   Title Patient will be independent with Initial HEP for strenghtening/balance (All STG due: 09/09/2020)    Baseline no HEP established    Time 4    Period Weeks    Status New    Target Date 09/09/20      PT SHORT TERM GOAL #2   Title Patient will demo ability to ambulate >500 ft on indoor surfaces with LRAD and Supervision    Baseline 100, Min Guard    Time 4    Period Weeks    Status New      PT SHORT TERM GOAL #3   Title Patient will undergo further Balance Assessment of BERG and LTG set    Baseline 44/56 on 8/23    Time 4    Period Weeks    Status Achieved    Target Date 09/09/20             PT Long Term Goals - 08/15/20 1203      PT LONG TERM GOAL #1   Title Patient will be independent with Final HEP (all LTGs due: 10/07/2020)    Baseline no HEP established    Time 8    Period Weeks    Status New      PT LONG TERM GOAL #2   Title Patient will improve Berg Balance by 5 points from baseline to demonstrate improved balance and reduced fall risk    Baseline 44/56 on 8/23    Time 8    Period Weeks    Status New      PT LONG TERM GOAL #3   Title Patient will demonsrate ability to ascend/descend stairs 4 stairs at supervision level without UE support    Baseline ascend/descend with RW, Min Guard    Time 8    Period Weeks    Status New      PT LONG TERM GOAL #4   Title Patient will improve TUG <12 seconds with LRAD to demo reduced fall risk    Baseline 18.56 secs    Time 8    Period Weeks    Status New      PT  LONG  TERM GOAL #5   Title Patient will improve 5x sit<> stand to <12 seconds with UE support to demonstrate improved functional strength and mobility    Baseline 15.97 secs    Time 8    Period Weeks    Status New      PT LONG TERM GOAL #6   Title Patient will improve gait speed to 2.5 ft/sec to demonstrate improved community ambulation    Baseline 1.90 ft/sec    Time 8    Period Weeks    Status New                 Plan - 08/17/20 1538    Clinical Impression Statement Today's skilled session continued to focus on LE strengthening and gait with no AD. Pt noted to scuff right heel with swing phase, did not improve with cues for increased hip/knee flexion with swing phase. Remainder of session began to address static balance on compliant surfaces with up to min assist needed for balance. The pt is progressing toward goals and should benefit from continued PT to progress toward unmet goals.    Personal Factors and Comorbidities Comorbidity 2    Comorbidities HTN, Patent Foramen Ovale    Examination-Activity Limitations Stairs;Transfers;Stand;Bed Mobility    Examination-Participation Restrictions Community Activity;Driving;Yard Work    Conservation officer, historic buildings Evolving/Moderate complexity    Rehab Potential Good    PT Frequency 2x / week    PT Duration 8 weeks    PT Treatment/Interventions ADLs/Self Care Home Management;Aquatic Therapy;Electrical Stimulation;Moist Heat;DME Instruction;Gait training;Stair training;Functional mobility training;Therapeutic activities;Therapeutic exercise;Balance training;Neuromuscular re-education;Patient/family education;Orthotic Fit/Training;Manual techniques;Passive range of motion;Energy conservation    PT Next Visit Plan gait training without AD. Work on [employer]. RLE strengthening    Consulted and Agree with Plan of Care Patient;Family member/caregiver    Family Member Consulted Husband           Patient will benefit from skilled  therapeutic intervention in order to improve the following deficits and impairments:  Abnormal gait, Decreased balance, Decreased endurance, Decreased mobility, Difficulty walking, Decreased range of motion, Decreased activity tolerance, Decreased knowledge of use of DME, Decreased coordination, Decreased strength  Visit Diagnosis: Hemiplegia and hemiparesis following cerebral infarction affecting right dominant side (HCC)  Muscle weakness (generalized)  Other abnormalities of gait and mobility  Unsteadiness on feet  Foot drop, right     Problem List Patient Active Problem List   Diagnosis Date Noted  . SAH (subarachnoid hemorrhage) (HCC) - L frontal parafalcine, present on admission w/ infarcts 07/20/2020  . 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 Essential hypertension 07/20/2020  . Hyperlipidemia 07/20/2020  . Urinary retention 07/20/2020  . Obesity 07/20/2020  . Hypokalemia 07/20/2020  . Hyperglycemia   . PFO (patent foramen ovale) 07/19/2020  . Acute L ACA, R cerebellar, L temporoparietal infarcts (HCC)  07/17/2020  . Neurologic abnormality   . Stroke (cerebrum) (HCC) 07/16/2020    Sallyanne Kuster, PTA, [facility] [address], [address] [place], [place] [address] [phone] 08/17/20, 4:32 PM   Name: Tanya Lopez MRN: [id number] Date of Birth: [birth year]

## 2020-08-18 ENCOUNTER — Ambulatory Visit: Payer: BC Managed Care – PPO | Admitting: Speech Pathology

## 2020-08-18 DIAGNOSIS — R4701 Aphasia: Secondary | ICD-10-CM

## 2020-08-18 DIAGNOSIS — I69351 Hemiplegia and hemiparesis following cerebral infarction affecting right dominant side: Secondary | ICD-10-CM | POA: Diagnosis not present

## 2020-08-18 NOTE — Patient Instructions (Signed)
Tips for Talking with People who have Aphasia  . Say one thing at a time . Don't  rush - slow down, be patient . Talk face to face . Reduce background noise . Relax - be natural . Use pen and paper . Write down key words . Draw diagrams or pictures . Don't pretend you understand . Ask what helps . Recap - check you both understand . Be a partner, not a therapist   Aphasia does not affect intelligence, only language. The person with aphasia can still: make decisions, have opinions, and socialize.   Describing words  What group does it belong to?  What do I use it for?  Where can I find it?  What does it LOOK like?  What other words go with it?  What is the 1st sound of the word?   Many Ways to Communicate  Describe it Write it Draw it Gesture it Use related words  Resources  [facility] [place] with Dr. Cynda Familia at [facility] - email [email or web address]  TalkPath Therapy app by Sherron Flemings on [facility] website National Aphasia Association - naa.aphasia.org Aphasia Recovery Connection - aphasiarecoveryconnection.org Tactus therapy apps Constant Therapy

## 2020-08-18 NOTE — Therapy (Signed)
[facility] [address] [place], [place], [address] Phone: [phone]   Fax:  [phone]  Speech Language Pathology Treatment  Patient Details  Name: Tanya Lopez MRN: [id number] Date of Birth: 18-Jan-1971 Referring Provider (SLP): Dr. Faith Rogue   Encounter Date: 08/18/2020   End of Session - 08/18/20 1336    Visit Number 2    Number of Visits 17    Date for SLP Re-Evaluation 10/10/20    SLP Start Time 0850    SLP Stop Time  0930    SLP Time Calculation (min) 40 min    Activity Tolerance Patient tolerated treatment well           Past Medical History:  Diagnosis Date  . Hypertension   . PFO (patent foramen ovale) 07/19/2020   Noted on TEE 07/19/2020    Past Surgical History:  Procedure Laterality Date  . IR ANGIO INTRA EXTRACRAN SEL COM CAROTID INNOMINATE BILAT MOD SED  07/17/2020  . IR ANGIO VERTEBRAL SEL VERTEBRAL BILAT MOD SED  07/17/2020  . IR CT HEAD LTD  07/17/2020  . RADIOLOGY WITH ANESTHESIA N/A 07/16/2020   Procedure: IR WITH ANESTHESIA;  Surgeon: Julieanne Cotton, MD;  Location: [facility] OR;  Service: Radiology;  Laterality: N/A;  . TEE WITHOUT CARDIOVERSION N/A 07/19/2020   Procedure: TRANSESOPHAGEAL ECHOCARDIOGRAM (TEE);  Surgeon: Chilton Si, MD;  Location: [facility] ENDOSCOPY;  Service: Cardiovascular;  Laterality: N/A;    There were no vitals filed for this visit.   Subjective Assessment - 08/18/20 0853    Subjective "Not so good" re: homework    Patient is accompained by: Family member   Fayrene Fearing   Currently in Pain? No/denies                 ADULT SLP TREATMENT - 08/18/20 1330      General Information   Behavior/Cognition Alert;Cooperative      Treatment Provided   Treatment provided Cognitive-Linquistic      Pain Assessment   Pain Assessment No/denies pain      Cognitive-Linquistic Treatment   Treatment focused on Aphasia    Skilled Treatment SLP reviewed therapy  goals with pt and spouse, as well as aphasia compensations. Educated on communication breakdowns and that it may be helpful to track these, discuss with therapist to ID or role play strategies that might have been helpful. Pt/spouse reported communication breakdown last night but could not remember what it was about. Pt worked on home tasks (mostly letter fill-ins). Demo'd TalkPath Therapy app; level 3 naming 10/10. Also provided information re: Constant Therapy for home tasks. Targeted sentence-level writing using VNEST; pt generated 5 sentences with occasional min cues. With re-reading pt self-corrected errors ~80% of the time.       Assessment / Recommendations / Plan   Plan Continue with current plan of care      Progression Toward Goals   Progression toward goals Progressing toward goals            SLP Education - 08/18/20 1336    Education Details aphasia compensations    Person(s) Educated Patient;Spouse    Methods Explanation;Handout    Comprehension Verbalized understanding            SLP Short Term Goals - 08/18/20 1337      SLP SHORT TERM GOAL #1   Title Pt will name 10 items in personally relevant categorie swith rare min A    Time 4    Period Weeks  Status On-going      SLP SHORT TERM GOAL #2   Title Pt will utilize compensations for anomia in structured naming tasks with rare min A over 2 sessions    Time 4    Period Weeks    Status On-going      SLP SHORT TERM GOAL #3   Title Pt will ID and correct written errors at sentence level with rare min A over 2 sessions    Time 8    Period Weeks    Status On-going            SLP Long Term Goals - 08/18/20 1337      SLP LONG TERM GOAL #1   Title Pt will utilize compensations for aphasia as needed in 20 minute mildly complex conversation with mod I over 2 sessions    Time 8    Period Weeks    Status On-going      SLP LONG TERM GOAL #2   Title Pt will write or email 2-3 paragraph message self correcting  errors with rare min A over 2 sessions    Time 8    Period Weeks    Status On-going      SLP LONG TERM GOAL #3   Title Pt will complete complex naming tasks with 90% accuracy over 2 sessions rare min A    Time 8    Period Weeks    Status On-going            Plan - 08/18/20 1337    Clinical Impression Statement Tanya Lopez is referred for outpt ST for aphasia s/p CVA. Prior to CVA, she was independent with all IADL's. She has a PhD in [employer] and was working as a Environmental manager prior to CVA. She enjoys golf. She is accompanied by her spouse, Fayrene Fearing. Alexes has a 49 yo daughter and 62 yo daughter. 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 Reading and auditory comprehension releatively intact. She named 8 items in simple category. (20 in 1 minuteis WNL) Aphasia affects her ability to return to work and communicate within the community. Icelynn verbalizes frustration and demonstrates some anxiety with anomic episodes. A this time, she is not using compensations for aphasia. I reviewed these with her I recommend skilled ST to maximize communication for independence, QOL and safety.    Speech Therapy Frequency 2x / week    Duration --   8 weeks or 17 visits   Treatment/Interventions Language facilitation;Environmental controls;Cueing hierarchy;SLP instruction and feedback;Compensatory strategies;Functional tasks;Compensatory techniques;Patient/family education;Multimodal communcation approach;Internal/external aids           Patient will benefit from skilled therapeutic intervention in order to improve the following deficits and impairments:   Aphasia    Problem List Patient Active Problem List   Diagnosis Date Noted  . SAH (subarachnoid hemorrhage) (HCC) - L frontal parafalcine, present on admission w/ infarcts 07/20/2020  . Possible Dissection of L  Anterior cerebral artery (HCC) 07/20/2020  . Essential hypertension 07/20/2020  . Hyperlipidemia 07/20/2020  . Urinary retention 07/20/2020  . Obesity 07/20/2020  . Hypokalemia 07/20/2020  . Hyperglycemia   . PFO (patent foramen ovale) 07/19/2020  . Acute L ACA, R cerebellar, L temporoparietal infarcts (HCC)  07/17/2020  . Neurologic abnormality   . Stroke (cerebrum) (HCC) 07/16/2020   Rondel Baton, MS, CCC-SLP Speech-Language Pathologist  Arlana Lindau 08/18/2020, 1:37 PM  [facility] [address] [place], [place], [address] Phone: [phone]   Fax:  [phone]   Name: TIANNI ESCAMILLA MRN: [id number] Date of Birth: [birth year]

## 2020-08-22 ENCOUNTER — Encounter: Payer: Self-pay | Admitting: *Deleted

## 2020-08-22 ENCOUNTER — Other Ambulatory Visit: Payer: Self-pay

## 2020-08-22 ENCOUNTER — Ambulatory Visit (INDEPENDENT_AMBULATORY_CARE_PROVIDER_SITE_OTHER): Payer: BC Managed Care – PPO | Admitting: Cardiovascular Disease

## 2020-08-22 ENCOUNTER — Encounter: Payer: Self-pay | Admitting: Cardiovascular Disease

## 2020-08-22 VITALS — BP 132/80 | HR 91 | Ht 62.0 in | Wt 187.0 lb

## 2020-08-22 DIAGNOSIS — Q211 Atrial septal defect: Secondary | ICD-10-CM

## 2020-08-22 DIAGNOSIS — I639 Cerebral infarction, unspecified: Secondary | ICD-10-CM | POA: Diagnosis not present

## 2020-08-22 DIAGNOSIS — Q2112 Patent foramen ovale: Secondary | ICD-10-CM

## 2020-08-22 NOTE — Progress Notes (Signed)
Cardiology Office Note:    Date:  08/23/2020   ID:  Tanya Lopez, DOB [birth year], MRN [id number]  PCP:  Patient, No Pcp Per  [facility] Cardiologist:  No primary care provider on file.  [facility] Electrophysiologist:  None   Referring MD: No ref. provider found   Chief Complaint  Patient presents with  . PFO Consult    History of Present Illness:    Tanya Lopez is a 49 y.o. female referred for evaluation of PFO by Dr. Roda Shutters.  The patient was hospitalized 7/24 through 07/20/2020 with an acute stroke.  Presenting symptoms included aphasia and right sided weakness. She was found to have left ACA infarcts, right cerebellar and left temporoparietal infarcts with a focal area of small subarachnoid hemorrhage.  Her stroke work-up included a transesophageal echocardiogram which showed normal LV and RV function, mild mitral regurgitation, trivial tricuspid and aortic regurgitation, no evidence of intracardiac mass, and presence of a small PFO with right-to-left flow by color Doppler assessment and bubble study.  Lower extremity venous Doppler study was negative.  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 She was hospitalized 7/24-28, then went through inpatient rehab 7/28-8/19.   She is now back home. She is here with her husband, Dr Pearson Grippe. The patient is ambulating with a walker. She has some word finding difficulty. No CP, dyspnea, leg swelling, palpitations, orthopnea, or PND. Compliant with medications. Recently underwent evaluation by neurology at [facility] with Dr Jilda Panda. These records are reviewed today.   Past Medical History:  Diagnosis Date  . Hypertension   . PFO (patent foramen ovale) 07/19/2020   Noted on TEE 07/19/2020    Past Surgical History:  Procedure Laterality Date  . IR ANGIO INTRA EXTRACRAN SEL COM CAROTID INNOMINATE BILAT MOD SED  07/17/2020  . IR ANGIO VERTEBRAL SEL VERTEBRAL BILAT  MOD SED  07/17/2020  . IR CT HEAD LTD  07/17/2020  . RADIOLOGY WITH ANESTHESIA N/A 07/16/2020   Procedure: IR WITH ANESTHESIA;  Surgeon: Julieanne Cotton, MD;  Location: [facility] OR;  Service: Radiology;  Laterality: N/A;  . TEE WITHOUT CARDIOVERSION N/A 07/19/2020   Procedure: TRANSESOPHAGEAL ECHOCARDIOGRAM (TEE);  Surgeon: Chilton Si, MD;  Location[facility] ENDOSCOPY;  Service: Cardiovascular;  Laterality: N/A;    Current Medications: Current Meds  Medication Sig  . acetaminophen (TYLENOL) 325 MG tablet Take 2 tablets (650 mg total) by mouth every 4 (four) hours as needed for mild pain (or temp > 37.5 C (99.5 F)).  Marland Kitchen amLODipine (NORVASC) 10 MG tablet Take 1 tablet (10 mg total) by mouth daily.  Marland Kitchen aspirin EC 325 MG EC tablet Take 1 tablet (325 mg total) by mouth daily.  Marland Kitchen atorvastatin (LIPITOR) 80 MG tablet Take 1 tablet (80 mg total) by mouth daily.  . clopidogrel (PLAVIX) 75 MG tablet Take 1 tablet (75 mg total) by mouth daily.  . nebivolol (BYSTOLIC) 5 MG tablet Take 1 tablet (5 mg total) by mouth daily.     Allergies:   Patient has no known allergies.   Social History   Socioeconomic History  . Marital status: Married    Spouse name: Not on file  . Number of children: Not on file  . Years of education: Not on file  . Highest education level: Not on file  Occupational History  . Not on file  Tobacco Use  . Smoking status: Never Smoker  . Smokeless tobacco: Never Used  Vaping Use  .  Vaping Use: Never used  Substance and Sexual Activity  . Alcohol use: Never  . Drug use: Never  . Sexual activity: Not on file  Other Topics Concern  . Not on file  Social History Narrative  . Not on file   Social Determinants of Health   Financial Resource Strain:   . Difficulty of Paying Living Expenses: Not on file  Food Insecurity:   . Worried About Programme researcher, broadcasting/film/video in the Last Year: Not on file  . Ran Out of Food in the Last Year: Not on file  Transportation Needs:   . Lack of  Transportation (Medical): Not on file  . Lack of Transportation (Non-Medical): Not on file  Physical Activity:   . Days of Exercise per Week: Not on file  . Minutes of Exercise per Session: Not on file  Stress:   . Feeling of Stress : Not on file  Social Connections:   . Frequency of Communication with Friends and Family: Not on file  . Frequency of Social Gatherings with Friends and Family: Not on file  . Attends Religious Services: Not on file  . Active Member of Clubs or Organizations: Not on file  . Attends Banker Meetings: Not on file  . Marital Status: Not on file     Family History: The patient's family history includes High blood pressure in her father and mother; Stroke in her mother.  ROS:   Please see the history of present illness.    All other systems reviewed and are negative.  EKGs/Labs/Other Studies Reviewed:    The following studies were reviewed today: TEE: IMPRESSIONS    1. Left ventricular ejection fraction, by estimation, is 65 to 70%. The  left ventricle has normal function. The left ventricle has no regional  wall motion abnormalities.  2. Right ventricular systolic function is normal. The right ventricular  size is normal.  3. No left atrial/left atrial appendage thrombus was detected.  4. The mitral valve is normal in structure. Trivial mitral valve  regurgitation. No evidence of mitral stenosis.  5. The aortic valve is tricuspid. Aortic valve regurgitation is trivial.  No aortic stenosis is present.  6. Evidence of atrial level shunting detected by color flow Doppler.  Agitated saline contrast bubble study was positive with shunting observed  within 3-6 cardiac cycles suggestive of interatrial shunt. There is a  small patent foramen ovale with  predominantly right to left shunting across the atrial septum.   Conclusion(s)/Recommendation(s): Normal biventricular function without  evidence of hemodynamically significant  valvular heart disease.   EKG:  EKG is not ordered today.   Recent Labs: 07/21/2020: ALT 22 08/08/2020: BUN 15; Creatinine, Ser 0.85; Hemoglobin 11.5; Platelets 295; Potassium 3.7; Sodium 139  Recent Lipid Panel    Component Value Date/Time   CHOL 239 (H) 07/19/2020 0300   TRIG 330 (H) 07/20/2020 0350   HDL 43 07/19/2020 0300   CHOLHDL 5.6 07/19/2020 0300   VLDL 72 (H) 07/19/2020 0300   LDLCALC 124 (H) 07/19/2020 0300   LDLDIRECT 87.5 07/18/2020 0522    Physical Exam:    VS:  BP 132/80   Pulse 91   Ht  (1.575 m)   Wt 187 lb (84.8 kg)   SpO2 98%   BMI 34.20 kg/m     Wt Readings from Last 3 Encounters:  08/22/20 187 lb (84.8 kg)  08/08/20 193 lb 2 oz (87.6 kg)  07/18/20 (!) 216 lb 4.8 oz (98.1 kg)  GEN:  Well nourished, well developed in no acute distress HEENT: Normal NECK: No JVD; No carotid bruits LYMPHATICS: No lymphadenopathy CARDIAC: RRR, no murmurs, rubs, gallops RESPIRATORY:  Clear to auscultation without rales, wheezing or rhonchi  ABDOMEN: Soft, non-tender, non-distended MUSCULOSKELETAL:  No edema; No deformity  SKIN: Warm and dry NEUROLOGIC:  Alert and oriented x 3. Word finding difficulty noted. PSYCHIATRIC:  Normal affect   ASSESSMENT:    1. PFO (patent foramen ovale)   2. Cryptogenic stroke (HCC)    PLAN:    In order of problems listed above:  1. Hospital records, neurology consultation, and TEE images all reviewed. She presented with left ACA stroke also noted to have small right cerebellar, right thalamic, and left parietal infarcts. Etiologies of stroke could include cardioembolism from PFO, A2 stenosis/dissection/thrombus, or atrial fibrillation. Neurology has recommended a repeat CTA of the head and neck. We will arrange a 30-day monitor to screen for atrial fibrillation. Regarding the PFO, we discussed mechanism of paradoxical embolus today. I personally reviewed her TEE and the PFO is small without obvious color flow across the septum.  The agitated saline study is clearly positive, with mild-moderate shunt. There is no associated atrial septal aneurysm. Considering the option of PFO closure is reasonable in this patient with a Rope score of 7. However, I think it is prudent to complete her workup with a monitor and repeat CTA as planned. Anatomically, the PFO does not have 'high-risk' features of a large defect or with associated atrial septal aneurysm. I demonstrated the PFO closure device to the patient and her husband today and I reviewed risks, indications, and alternatives with them. Will plan to follow-up in about 6-8 weeks after the event monitor is completed. All of their questions are answered today.    Medication Adjustments/Labs and Tests Ordered: Current medicines are reviewed at length with the patient today.  Concerns regarding medicines are outlined above.  Orders Placed This Encounter  Procedures  . CARDIAC EVENT MONITOR  . EKG 12-Lead   No orders of the defined types were placed in this encounter.   Patient Instructions  Medication Instructions:  Your provider recommends that you continue on your current medications as directed. Please refer to the Current Medication list given to you today.   *If you need a refill on your cardiac medications before your next appointment, please call your pharmacy*  Testing/Procedures: Your physician has recommended that you wear an event monitor. Event monitors are medical devices that record the heart's electrical activity. Doctors most often [place] these monitors to diagnose arrhythmias. Arrhythmias are problems with the speed or rhythm of the heartbeat. The monitor is a small, portable device. You can wear one while you do your normal daily activities. This is usually used to diagnose what is causing palpitations/syncope (passing out). Apply this after your MRI.  Follow-Up: Your provider recommends that you schedule a follow-up appointment in 3 MONTHS with Dr.  Excell Seltzer.  [employer] Cardiac Event Monitor Instructions Your physician has requested you wear your cardiac event monitor for 30 days. [employer] may call or text to confirm a shipping address. The monitor will be sent to a land address via UPS. [employer] will not ship a monitor to a PO BOX. It typically takes 3-5 days to receive your monitor after it has been enrolled. [employer] will assist with USPS tracking if your package is delayed. The telephone number for [employer] is [phone]. Once you have received your monitor, please review the enclosed instructions. Instruction tutorials can  also be viewed under help and settings on the enclosed cell phone. Your monitor has already been registered assigning a specific monitor serial # to you.  Applying the monitor Remove cell phone from case and turn it on. The cell phone works as IT consultantyour transmitter and needs to be within [employer] feet of you at all times. The cell phone will need to be charged on a daily basis. We recommend you plug the cell phone into the enclosed charger at your bedside table every night.  Monitor batteries: You will receive two monitor batteries labelled #1 and #2. These are your recorders. Plug battery #2 onto the second connection on the enclosed charger. Keep one battery on the charger at all times. This will keep the monitor battery deactivated. It will also keep it fully charged for when you need to switch your monitor batteries. A small light will be blinking on the battery emblem when it is charging. The light on the battery emblem will remain on when the battery is fully charged.  Open package of a Monitor strip. Insert battery #1 into black hood on strip and gently squeeze monitor battery onto connection as indicated in instruction booklet. Set aside while preparing skin.  Choose location for your strip, vertical or horizontal, as indicated in the instruction booklet. Shave to remove all hair from location. There  cannot be any lotions, oils, powders, or colognes on skin where monitor is to be applied. Wipe skin clean with enclosed Saline wipe. Dry skin completely.  Peel paper labeled #1 off the back of the Monitor strip exposing the adhesive. Place the monitor on the chest in the vertical or horizontal position shown in the instruction booklet. One arrow on the monitor strip must be pointing upward. Carefully remove paper labeled #2, attaching remainder of strip to your skin. Try not to create any folds or wrinkles in the strip as you apply it.  Firmly press and release the circle in the center of the monitor battery. You will hear a small beep. This is turning the monitor battery on. The heart emblem on the monitor battery will light up every 5 seconds if the monitor battery in turned on and connected to the patient securely. Do not push and hold the circle down as this turns the monitor battery off. The cell phone will locate the monitor battery. A screen will appear on the cell phone checking the connection of your monitor strip. This may read poor connection initially but change to good connection within the next minute. Once your monitor accepts the connection you will hear a series of 3 beeps followed by a climbing crescendo of beeps. A screen will appear on the cell phone showing the two monitor strip placement options. Touch the picture that demonstrates where you applied the monitor strip.  Your monitor strip and battery are waterproof. You are able to shower, bathe, or swim with the monitor on. They just ask you do not submerge deeper than 3 feet underwater. We recommend removing the monitor if you are swimming in a lake, river, or ocean.  Your monitor battery will need to be switched to a fully charged monitor battery approximately once a week. The cell phone will alert you of an action which needs to be made.  On the cell phone, tap for details to reveal connection status, monitor battery  status, and cell phone battery status. The green dots indicates your monitor is in good status. A red dot indicates there is something that needs  your attention.  To record a symptom, click the circle on the monitor battery. In 30-60 seconds a list of symptoms will appear on the cell phone. Select your symptom and tap save. Your monitor will record a sustained or significant arrhythmia regardless of you clicking the button. Some patients do not feel the heart rhythm irregularities. [employer] will notify us of any serious or critical events.  Refer to instruction booklet for instructions on switching batteries, changing strips, the Do not disturb or Pause features, or any additional questions.  Call [employer] at [phone], to confirm your monitor is transmitting and record your baseline. They will answer any questions you may have regarding the monitor instructions at that time.  Returning the monitor to [employer] Place all equipment back into blue box. Peel off strip of paper to expose adhesive and close box securely. There is a prepaid UPS shipping label on this box. Drop in a UPS drop box, or at a UPS facility like [employer]. You may also contact [employer] to arrange UPS to pick up monitor package at your home.     Signed, Tonny Bollman, MD  08/23/2020 3:26 PM    [facility]

## 2020-08-22 NOTE — Patient Instructions (Addendum)
Medication Instructions:  Your provider recommends that you continue on your current medications as directed. Please refer to the Current Medication list given to you today.   *If you need a refill on your cardiac medications before your next appointment, please call your pharmacy*  Testing/Procedures: Your physician has recommended that you wear an event monitor. Event monitors are medical devices that record the hearts electrical activity. Doctors most often [place] these monitors to diagnose arrhythmias. Arrhythmias are problems with the speed or rhythm of the heartbeat. The monitor is a small, portable device. You can wear one while you do your normal daily activities. This is usually used to diagnose what is causing palpitations/syncope (passing out). Apply this after your MRI.  Follow-Up: Your provider recommends that you schedule a follow-up appointment in 3 MONTHS with Dr. Excell Seltzer.  [employer] Cardiac Event Monitor Instructions Your physician has requested you wear your cardiac event monitor for 30 days. [employer] may call or text to confirm a shipping address. The monitor will be sent to a land address via UPS. [employer] will not ship a monitor to a PO BOX. It typically takes 3-5 days to receive your monitor after it has been enrolled. [employer] will assist with USPS tracking if your package is delayed. The telephone number for [employer] is [phone]. Once you have received your monitor, please review the enclosed instructions. Instruction tutorials can also be viewed under help and settings on the enclosed cell phone. Your monitor has already been registered assigning a specific monitor serial # to you.  Applying the monitor Remove cell phone from case and turn it on. The cell phone works as IT consultant and needs to be within [employer] of you at all times. The cell phone will need to be charged on a daily basis. We recommend you plug the cell phone into the enclosed charger at  your bedside table every night.  Monitor batteries: You will receive two monitor batteries labelled #1 and #2. These are your recorders. Plug battery #2 onto the second connection on the enclosed charger. Keep one battery on the charger at all times. This will keep the monitor battery deactivated. It will also keep it fully charged for when you need to switch your monitor batteries. A small light will be blinking on the battery emblem when it is charging. The light on the battery emblem will remain on when the battery is fully charged.  Open package of a Monitor strip. Insert battery #1 into black hood on strip and gently squeeze monitor battery onto connection as indicated in instruction booklet. Set aside while preparing skin.  Choose location for your strip, vertical or horizontal, as indicated in the instruction booklet. Shave to remove all hair from location. There cannot be any lotions, oils, powders, or colognes on skin where monitor is to be applied. Wipe skin clean with enclosed Saline wipe. Dry skin completely.  Peel paper labeled #1 off the back of the Monitor strip exposing the adhesive. Place the monitor on the chest in the vertical or horizontal position shown in the instruction booklet. One arrow on the monitor strip must be pointing upward. Carefully remove paper labeled #2, attaching remainder of strip to your skin. Try not to create any folds or wrinkles in the strip as you apply it.  Firmly press and release the circle in the center of the monitor battery. You will hear a small beep. This is turning the monitor battery on. The heart emblem on the monitor battery will light up  every 5 seconds if the monitor battery in turned on and connected to the patient securely. Do not push and hold the circle down as this turns the monitor battery off. The cell phone will locate the monitor battery. A screen will appear on the cell phone checking the connection of your monitor strip.  This may read poor connection initially but change to good connection within the next minute. Once your monitor accepts the connection you will hear a series of 3 beeps followed by a climbing crescendo of beeps. A screen will appear on the cell phone showing the two monitor strip placement options. Touch the picture that demonstrates where you applied the monitor strip.  Your monitor strip and battery are waterproof. You are able to shower, bathe, or swim with the monitor on. They just ask you do not submerge deeper than 3 feet underwater. We recommend removing the monitor if you are swimming in a lake, river, or ocean.  Your monitor battery will need to be switched to a fully charged monitor battery approximately once a week. The cell phone will alert you of an action which needs to be made.  On the cell phone, tap for details to reveal connection status, monitor battery status, and cell phone battery status. The green dots indicates your monitor is in good status. A red dot indicates there is something that needs your attention.  To record a symptom, click the circle on the monitor battery. In 30-60 seconds a list of symptoms will appear on the cell phone. Select your symptom and tap save. Your monitor will record a sustained or significant arrhythmia regardless of you clicking the button. Some patients do not feel the heart rhythm irregularities. [employer] will notify us of any serious or critical events.  Refer to instruction booklet for instructions on switching batteries, changing strips, the Do not disturb or Pause features, or any additional questions.  Call [employer] at [phone], to confirm your monitor is transmitting and record your baseline. They will answer any questions you may have regarding the monitor instructions at that time.  Returning the monitor to [employer] Place all equipment back into blue box. Peel off strip of paper to expose adhesive and close box  securely. There is a prepaid UPS shipping label on this box. Drop in a UPS drop box, or at a UPS facility like [employer]. You may also contact [employer] to arrange UPS to pick up monitor package at your home.

## 2020-08-22 NOTE — Progress Notes (Signed)
Patient ID: Tanya Lopez, female   DOB: 26-Feb-1971, 49 y.o.   MRN: [id number] Patient enrolled for [employer] to ship a 30 day cardiac event monitor to her home.

## 2020-08-23 ENCOUNTER — Encounter: Payer: Self-pay | Admitting: Cardiovascular Disease

## 2020-08-24 ENCOUNTER — Ambulatory Visit: Payer: BC Managed Care – PPO | Admitting: Speech Pathology

## 2020-08-24 ENCOUNTER — Other Ambulatory Visit: Payer: Self-pay

## 2020-08-24 ENCOUNTER — Ambulatory Visit: Payer: BC Managed Care – PPO | Attending: Physician Assistant | Admitting: Physical Therapy

## 2020-08-24 DIAGNOSIS — I69318 Other symptoms and signs involving cognitive functions following cerebral infarction: Secondary | ICD-10-CM | POA: Insufficient documentation

## 2020-08-24 DIAGNOSIS — R41841 Cognitive communication deficit: Secondary | ICD-10-CM | POA: Insufficient documentation

## 2020-08-24 DIAGNOSIS — R2689 Other abnormalities of gait and mobility: Secondary | ICD-10-CM | POA: Insufficient documentation

## 2020-08-24 DIAGNOSIS — R4701 Aphasia: Secondary | ICD-10-CM | POA: Diagnosis present

## 2020-08-24 DIAGNOSIS — I69351 Hemiplegia and hemiparesis following cerebral infarction affecting right dominant side: Secondary | ICD-10-CM | POA: Diagnosis present

## 2020-08-24 DIAGNOSIS — M21371 Foot drop, right foot: Secondary | ICD-10-CM | POA: Insufficient documentation

## 2020-08-24 DIAGNOSIS — M6281 Muscle weakness (generalized): Secondary | ICD-10-CM | POA: Insufficient documentation

## 2020-08-24 DIAGNOSIS — R2681 Unsteadiness on feet: Secondary | ICD-10-CM

## 2020-08-24 DIAGNOSIS — R41840 Attention and concentration deficit: Secondary | ICD-10-CM | POA: Diagnosis present

## 2020-08-24 NOTE — Therapy (Signed)
[facility] [address] [place], [place], [address] Phone: [phone]   Fax:  [phone]  Speech Language Pathology Treatment  Patient Details  Name: Tanya Lopez MRN: [id number] Date of Birth: Jul 13, 1971 Referring Provider (SLP): Dr. Faith Rogue   Encounter Date: 08/24/2020   End of Session - 08/24/20 1524    Visit Number 3    Number of Visits 17    Date for SLP Re-Evaluation 10/10/20    SLP Start Time 1147    SLP Stop Time  1230    SLP Time Calculation (min) 43 min    Activity Tolerance Patient tolerated treatment well           Past Medical History:  Diagnosis Date  . Hypertension   . PFO (patent foramen ovale) 07/19/2020   Noted on TEE 07/19/2020    Past Surgical History:  Procedure Laterality Date  . IR ANGIO INTRA EXTRACRAN SEL COM CAROTID INNOMINATE BILAT MOD SED  07/17/2020  . IR ANGIO VERTEBRAL SEL VERTEBRAL BILAT MOD SED  07/17/2020  . IR CT HEAD LTD  07/17/2020  . RADIOLOGY WITH ANESTHESIA N/A 07/16/2020   Procedure: IR WITH ANESTHESIA;  Surgeon: Julieanne Cotton, MD;  Location: [facility] OR;  Service: Radiology;  Laterality: N/A;  . TEE WITHOUT CARDIOVERSION N/A 07/19/2020   Procedure: TRANSESOPHAGEAL ECHOCARDIOGRAM (TEE);  Surgeon: Chilton Si, MD;  Location[facility] ENDOSCOPY;  Service: Cardiovascular;  Laterality: N/A;    There were no vitals filed for this visit.   Subjective Assessment - 08/24/20 1157    Subjective "OK"    Patient is accompained by: Family member   Tanya Lopez   Currently in Pain? No/denies                 ADULT SLP TREATMENT - 08/24/20 1214      General Information   Behavior/Cognition Alert;Cooperative;Pleasant mood      Treatment Provided   Treatment provided Cognitive-Linquistic      Cognitive-Linquistic Treatment   Treatment focused on Aphasia;Patient/family/caregiver education    Skilled Treatment Targeted word finding and Identifying written errors  using VNeST, Nitasha ID'd errors with occasional min A, she generated 3-5 senteces for each verb with occasional min A. Ongoning training in compensations for word finding, pt verbalized strategies but has not used them. In structured task, she required usual min questioning cues to generate descriptions for simple photos. Semantic Feature Analysis chart reviewed and provided for visual cues as well.       Assessment / Recommendations / Plan   Plan Continue with current plan of care      Progression Toward Goals   Progression toward goals Progressing toward goals            SLP Education - 08/24/20 1240    Education Details compensations for aphasia    Person(s) Educated Patient;Spouse    Methods Explanation;Handout    Comprehension Verbalized understanding;Returned demonstration;Verbal cues required;Need further instruction            SLP Short Term Goals - 08/24/20 1241      SLP SHORT TERM GOAL #1   Title Pt will name 10 items in personally relevant categorie swith rare min A    Time 3    Period Weeks    Status On-going      SLP SHORT TERM GOAL #2   Title Pt will utilize compensations for anomia in structured naming tasks with rare min A over 2 sessions    Time 3  Period Weeks    Status On-going      SLP SHORT TERM GOAL #3   Title Pt will ID and correct written errors at sentence level with rare min A over 2 sessions    Time 3    Period Weeks    Status On-going            SLP Long Term Goals - 08/24/20 1242      SLP LONG TERM GOAL #1   Title Pt will utilize compensations for aphasia as needed in 20 minute mildly complex conversation with mod I over 2 sessions    Time 7    Period Weeks    Status On-going      SLP LONG TERM GOAL #2   Title Pt will write or email 2-3 paragraph message self correcting errors with rare min A over 2 sessions    Time 7    Period Weeks    Status On-going      SLP LONG TERM GOAL #3   Title Pt will complete complex naming tasks  with 90% accuracy over 2 sessions rare min A    Time 7    Period Weeks    Status On-going            Plan - 08/24/20 1241    Clinical Impression Statement Tanya Lopez is referred for outpt ST for aphasia s/p CVA. Prior to CVA, she was independent with all IADL's. She has a PhD in [employer] and was working as a Environmental manager prior to CVA. She enjoys golf. She is accompanied by her spouse, Tanya Lopez. Tanya Lopez has a 46 yo daughter and 65 yo daughter. 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 Reading and auditory comprehension releatively intact. She named 8 items in simple category. (20 in 1 minuteis WNL) Aphasia affects her ability to return to work and communicate within the community. Tanya Lopez verbalizes frustration and demonstrates some anxiety with anomic episodes. A this time, she is not using compensations for aphasia. I reviewed these with her I recommend skilled ST to maximize communication for independence, QOL and safety.    Speech Therapy Frequency 2x / week    Duration --   8 weeks or 17 visits   Treatment/Interventions Language facilitation;Environmental controls;Cueing hierarchy;SLP instruction and feedback;Compensatory strategies;Functional tasks;Compensatory techniques;Patient/family education;Multimodal communcation approach;Internal/external aids           Patient will benefit from skilled therapeutic intervention in order to improve the following deficits and impairments:   Aphasia    Problem List Patient Active Problem List   Diagnosis Date Noted  . SAH (subarachnoid hemorrhage) (HCC) - L frontal parafalcine, present on admission w/ infarcts 07/20/2020  . 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 Essential hypertension 07/20/2020  . Hyperlipidemia 07/20/2020  . Urinary retention 07/20/2020  . Obesity 07/20/2020   . Hypokalemia 07/20/2020  . Hyperglycemia   . PFO (patent foramen ovale) 07/19/2020  . Acute L ACA, R cerebellar, L temporoparietal infarcts (HCC)  07/17/2020  . Neurologic abnormality   . Stroke (cerebrum) (HCC) 07/16/2020    Tanya Lopez, Radene Journey  MS, CCC-SLP 08/24/2020, 3:25 PM  [facility] [address] [place], [place], [address] Phone: [phone]   Fax:  [phone]   Name: Tanya Lopez MRN: [id number] Date of Birth: 1971/01/25

## 2020-08-24 NOTE — Therapy (Signed)
[facility] [address] [place], [place], [address] Phone: [phone]   Fax:  [phone]  Physical Therapy Treatment  Patient Details  Name: Tanya Lopez MRN: [id number] Date of Birth: Mar 17, 1971 Referring Provider (PT): Mariam Dollar, PA-C. Followed up by: Faith Rogue, MD   Encounter Date: 08/24/2020   PT End of Session - 08/24/20 1651    Visit Number 4    Number of Visits 17    Date for PT Re-Evaluation 11/10/20   POC for 8 weeks, Cert for 90 days   Authorization Type [employer] (30 visit limit between OT/PT)    PT Start Time 1450    PT Stop Time 1530    PT Time Calculation (min) 40 min    Equipment Utilized During Treatment Gait belt    Activity Tolerance Patient tolerated treatment well    Behavior During Therapy WFL for tasks assessed/performed;Flat affect           Past Medical History:  Diagnosis Date  . Hypertension   . PFO (patent foramen ovale) 07/19/2020   Noted on TEE 07/19/2020    Past Surgical History:  Procedure Laterality Date  . IR ANGIO INTRA EXTRACRAN SEL COM CAROTID INNOMINATE BILAT MOD SED  07/17/2020  . IR ANGIO VERTEBRAL SEL VERTEBRAL BILAT MOD SED  07/17/2020  . IR CT HEAD LTD  07/17/2020  . RADIOLOGY WITH ANESTHESIA N/A 07/16/2020   Procedure: IR WITH ANESTHESIA;  Surgeon: Julieanne Cotton, MD;  Location: [facility] OR;  Service: Radiology;  Laterality: N/A;  . TEE WITHOUT CARDIOVERSION N/A 07/19/2020   Procedure: TRANSESOPHAGEAL ECHOCARDIOGRAM (TEE);  Surgeon: Chilton Si, MD;  Location: [facility] ENDOSCOPY;  Service: Cardiovascular;  Laterality: N/A;    There were no vitals filed for this visit.   Subjective Assessment - 08/24/20 1454    Subjective No changes since she was last here. Not wearing her AFO today - states that it is uncomfortable.    Patient is accompained by: Family member   spouse in lobby   Pertinent History PMH: Hypertension, Patient Foramen Ovale    Limitations  Walking    Patient Stated Goals To be able to drive; to be able to talk    Currently in Pain? No/denies                             [facility] Adult PT Treatment/Exercise - 08/24/20 0001      Transfers   Transfers Sit to Stand;Stand to Sit    Sit to Stand 5: Supervision;Without upper extremity assist;From bed    Stand to Sit 5: Supervision;Without upper extremity assist;To bed    Comments Sit <> stands with green bubble under L foot for incr weight bearing/shifting towards RLE x10 reps, no UE support, cues for eccentric control       Ambulation/Gait   Ambulation/Gait Yes    Ambulation/Gait Assistance 4: Min guard;5: Supervision    Ambulation/Gait Assistance Details pt not wearing R AFO today. min guard with no AD, cues for R heel strike, wider BOS and incr hip/knee flexion to clear RLE (esp when fatigued)     Ambulation Distance (Feet) 115 Feet   x1 ,230 x 1 with no AD   Assistive device Rolling walker;None    Gait Pattern Step-through pattern;Decreased stance time - right;Decreased step length - left;Decreased dorsiflexion - right;Poor foot clearance - right    Ambulation Surface Level;Indoor    Gait Comments plus additional distances throughout  clinic with no AD between activities. pt ambulated in and out of clinic with no AD      Neuro Re-ed    Neuro Re-ed Details  At countertop for B ankle strengthening (esp R ankle DF) staggered stance weight shifting: x10 reps with LLE anteriorly, x15 reps with RLE anteriorly (needing initial assist to make sure R ankle stayed in place). At countertop: marching with single UE support down and back 3 reps with cues for incr hip/knee flexion (pt reporting dizziness after looking down that subsided), Standing at edge of mat: SLS taps with LLE for incr RLE stance and SLS to 6" step and then progressing to compliant surface under RLE, cues for slowed and controlled x10 reps for each. Next to counter; x10 reps stepping RLE over 2" foam beam  (progressing to 4") cues for incr hip/knee flexion and heel strike, x10 reps stepping LLE over 4" beam with no UE support                Balance Exercises - 08/24/20 0001      Balance Exercises: Standing   Tandem Gait Forward;2 reps;Limitations;Foam/compliant surface    Tandem Gait Limitations down and back 2 reps on blue beam, progressing to no UE support    Sidestepping Foam/compliant support;2 reps;Limitations    Sidestepping Limitations no UE support, cues for incr foot clearance, down and back 2 reps               PT Short Term Goals - 08/15/20 1202      PT SHORT TERM GOAL #1   Title Patient will be independent with Initial HEP for strenghtening/balance (All STG due: 09/09/2020)    Baseline no HEP established    Time 4    Period Weeks    Status New    Target Date 09/09/20      PT SHORT TERM GOAL #2   Title Patient will demo ability to ambulate >500 ft on indoor surfaces with LRAD and Supervision    Baseline 100, Min Guard    Time 4    Period Weeks    Status New      PT SHORT TERM GOAL #3   Title Patient will undergo further Balance Assessment of BERG and LTG set    Baseline 44/56 on 8/23    Time 4    Period Weeks    Status Achieved    Target Date 09/09/20             PT Long Term Goals - 08/15/20 1203      PT LONG TERM GOAL #1   Title Patient will be independent with Final HEP (all LTGs due: 10/07/2020)    Baseline no HEP established    Time 8    Period Weeks    Status New      PT LONG TERM GOAL #2   Title Patient will improve Berg Balance by 5 points from baseline to demonstrate improved balance and reduced fall risk    Baseline 44/56 on 8/23    Time 8    Period Weeks    Status New      PT LONG TERM GOAL #3   Title Patient will demonsrate ability to ascend/descend stairs 4 stairs at supervision level without UE support    Baseline ascend/descend with RW, Min Guard    Time 8    Period Weeks    Status New      PT LONG TERM GOAL #4    Title  Patient will improve TUG <12 seconds with LRAD to demo reduced fall risk    Baseline 18.56 secs    Time 8    Period Weeks    Status New      PT LONG TERM GOAL #5   Title Patient will improve 5x sit<> stand to <12 seconds with UE support to demonstrate improved functional strength and mobility    Baseline 15.97 secs    Time 8    Period Weeks    Status New      PT LONG TERM GOAL #6   Title Patient will improve gait speed to 2.5 ft/sec to demonstrate improved community ambulation    Baseline 1.90 ft/sec    Time 8    Period Weeks    Status New                 Plan - 08/24/20 1652    Clinical Impression Statement Today's skilled session continued to focus on RLE>LLE strengthening, balance on compliant surfaces, and gait with no AD. When more fatigued, pt needing verbal cues for incr R hip/knee flexion and heel strike during gait. No LOB noted with gait. Pt tolerated session well, will continue to progress towards LTGs.    Personal Factors and Comorbidities Comorbidity 2    Comorbidities HTN, Patent Foramen Ovale    Examination-Activity Limitations Stairs;Transfers;Stand;Bed Mobility    Examination-Participation Restrictions Community Activity;Driving;Yard Work    Conservation officer, historic buildings Evolving/Moderate complexity    Rehab Potential Good    PT Frequency 2x / week    PT Duration 8 weeks    PT Treatment/Interventions ADLs/Self Care Home Management;Aquatic Therapy;Electrical Stimulation;Moist Heat;DME Instruction;Gait training;Stair training;Functional mobility training;Therapeutic activities;Therapeutic exercise;Balance training;Neuromuscular re-education;Patient/family education;Orthotic Fit/Training;Manual techniques;Passive range of motion;Energy conservation    PT Next Visit Plan add standing balance to HEP. gait training without AD. Work on [employer]. RLE strengthening    Consulted and Agree with Plan of Care Patient;Family member/caregiver    Family  Member Consulted Husband           Patient will benefit from skilled therapeutic intervention in order to improve the following deficits and impairments:  Abnormal gait, Decreased balance, Decreased endurance, Decreased mobility, Difficulty walking, Decreased range of motion, Decreased activity tolerance, Decreased knowledge of use of DME, Decreased coordination, Decreased strength  Visit Diagnosis: Hemiplegia and hemiparesis following cerebral infarction affecting right dominant side (HCC)  Muscle weakness (generalized)  Other abnormalities of gait and mobility  Foot drop, right  Unsteadiness on feet     Problem List Patient Active Problem List   Diagnosis Date Noted  . SAH (subarachnoid hemorrhage) (HCC) - L frontal parafalcine, present on admission w/ infarcts 07/20/2020  . 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 Essential hypertension 07/20/2020  . Hyperlipidemia 07/20/2020  . Urinary retention 07/20/2020  . Obesity 07/20/2020  . Hypokalemia 07/20/2020  . Hyperglycemia   . PFO (patent foramen ovale) 07/19/2020  . Acute L ACA, R cerebellar, L temporoparietal infarcts (HCC)  07/17/2020  . Neurologic abnormality   . Stroke (cerebrum) (HCC) 07/16/2020    Drake Leach, PT, DPT  08/24/2020, 4:58 PM  [facility] [address] [place], [place], [address] Phone: [phone]   Fax:  [phone]  Name: Tanya Lopez MRN: [id number] Date of Birth: 03/09/1971

## 2020-08-25 ENCOUNTER — Encounter: Payer: Self-pay | Admitting: Registered Nurse

## 2020-08-25 ENCOUNTER — Encounter: Payer: BC Managed Care – PPO | Attending: Registered Nurse | Admitting: Registered Nurse

## 2020-08-25 ENCOUNTER — Telehealth: Payer: Self-pay

## 2020-08-25 ENCOUNTER — Other Ambulatory Visit: Payer: Self-pay

## 2020-08-25 VITALS — BP 121/83 | HR 90 | Temp 98.1°F | Ht 62.0 in | Wt 186.0 lb

## 2020-08-25 DIAGNOSIS — E7849 Other hyperlipidemia: Secondary | ICD-10-CM | POA: Insufficient documentation

## 2020-08-25 DIAGNOSIS — I10 Essential (primary) hypertension: Secondary | ICD-10-CM | POA: Insufficient documentation

## 2020-08-25 DIAGNOSIS — I609 Nontraumatic subarachnoid hemorrhage, unspecified: Secondary | ICD-10-CM | POA: Diagnosis not present

## 2020-08-25 NOTE — Telephone Encounter (Signed)
Ptn and Husband Dr Selena Batten in office today for [facility] - NP request assistance as spouse says chart notes from Rehab stay are missing from Mychart.  Only therapy shows and IP records prior to admission.  I pulled with NP in room chart notes and see body of all DOS that they are released to patient.  I emailed Ecologist and Dir of HIM's asking for assistance as he believes they are being blocked.  Not sure if My chart issue - but he stated that he had contacted my chart and HIM's and neither would take responsibility.

## 2020-08-25 NOTE — Progress Notes (Signed)
Subjective:    Patient ID: Tanya Lopez, female    DOB: [birth year], 49 y.o.   MRN: [id number]  HPI: Tanya Lopez is a 49 y.o. female who is here for hospital follow up visit of her [facility] Left Frontal Parafalcine, present on admission with infarcts, Essential Hypertension and hyperlipidemia. She presented to [facility] on 07/16/2020 with weakness and aphasia, a code stroke was activated en route. Neurology was consulted.  CT Head WO Contrast:  IMPRESSION: 1. Focal subarachnoid hemorrhage in the medial posterior left frontal lobe. Question venous hemorrhage. Vascular malformation considered less likely. Alternatively, could represent hemorrhagic conversion of an occult infarct. 2. Otherwise normal CT appearance of the brain. 3. ASPECTS is 10/10  CTA Head W/WO Contrast:  IMPRESSION: 1. 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 2. Incomplete filling of posterior left frontal cortical vein could represent cortical vein thrombosis. Cerebral arteriogram would be useful for further evaluation of both possibilities. 3. Mild tortuosity of cervical vessels without significant cervical stenosis. This is most commonly seen in the setting of chronic Hypertension.  MR Brain WO Contrast:  IMPRESSION: 1. Moderate-sized acute left ACA territory infarct. 2. Small acute right cerebellar, left occipital, and right thalamic infarcts. 3. Motion degraded head MRA with apparent improved left A2 segment flow compared to yesterday's CTA though with at least 1 persistent severe stenosis.  Per Dr Roda Shutters Note: She underwent Cerebral Angiogram on 07/17/2020:  /P 4 vessel cerebral arteriogram - RT CFA approach. 1. Smooth areas of narrowing of Lt ACA A2 and branches of A2 suspicious for dissection, less likely ICAD v vasospasm. No intraluminal filling defects noted . 2. Cortical veins and DVsinuses widely patent.  Tanya Lopez was admitted to  Inpatient Rehabilitation on 07/20/2020 and discharged home on 08/11/2020. She is receiving Outpatient Therapy at [facility], husband stated she is not receiving Occupational Therapy. This provider placed a call to [facility] on 08/26/2020 regarding the above and she will be scheduled for occupational therapy. Placed a call to Tanya Lopez, regarding the above voicemail full.  She denies any pain. She rated her pain 0/ Also reports she has a good appetite.   Husband in room, all questions answered. He had questions regarding her My-Chart: Tanya Lopez spoke with Mr. Tanya Batten.     Pain Inventory Average Pain 0 Pain Right Now 0 My pain is no pain  LOCATION OF PAIN  No pain  BOWEL Number of stools per week: 7 Oral laxative use No  Type of laxative none Enema or suppository use No  History of colostomy No  Incontinent No   BLADDER Normal In and out cath, frequency none Able to self cath none Bladder incontinence No  Frequent urination No  Leakage with coughing Yes  Difficulty starting stream No  Incomplete bladder emptying No    Mobility walk with assistance use a walker ability to climb steps?  yes do you drive?  no  Function not employed: date last employed .  Neuro/Psych bladder control problems trouble walking  Prior Studies hospital f/u  Physicians involved in your care hospital f/u   Family History  Problem Relation Age of Onset  . Stroke Mother   . High blood pressure Mother   . High blood pressure Father    Social History   Socioeconomic History  . Marital status: Married    Spouse name: Not on file  . Number of children: Not on file  . Years of education: Not on file  .  Highest education level: Not on file  Occupational History  . Not on file  Tobacco Use  . Smoking status: Never Smoker  . Smokeless tobacco: Never Used  Vaping Use  . Vaping Use: Never used  Substance and Sexual Activity  . Alcohol use: Never  . Drug use:  Never  . Sexual activity: Not on file  Other Topics Concern  . Not on file  Social History Narrative  . Not on file   Social Determinants of Health   Financial Resource Strain:   . Difficulty of Paying Living Expenses: Not on file  Food Insecurity:   . Worried About Programme researcher, broadcasting/film/video in the Last Year: Not on file  . Ran Out of Food in the Last Year: Not on file  Transportation Needs:   . Lack of Transportation (Medical): Not on file  . Lack of Transportation (Non-Medical): Not on file  Physical Activity:   . Days of Exercise per Week: Not on file  . Minutes of Exercise per Session: Not on file  Stress:   . Feeling of Stress : Not on file  Social Connections:   . Frequency of Communication with Friends and Family: Not on file  . Frequency of Social Gatherings with Friends and Family: Not on file  . Attends Religious Services: Not on file  . Active Member of Clubs or Organizations: Not on file  . Attends Banker Meetings: Not on file  . Marital Status: Not on file   Past Surgical History:  Procedure Laterality Date  . IR ANGIO INTRA EXTRACRAN SEL COM CAROTID INNOMINATE BILAT MOD SED  07/17/2020  . IR ANGIO VERTEBRAL SEL VERTEBRAL BILAT MOD SED  07/17/2020  . IR CT HEAD LTD  07/17/2020  . RADIOLOGY WITH ANESTHESIA N/A 07/16/2020   Procedure: IR WITH ANESTHESIA;  Surgeon: Julieanne Cotton, MD;  Location: [facility] OR;  Service: Radiology;  Laterality: N/A;  . TEE WITHOUT CARDIOVERSION N/A 07/19/2020   Procedure: TRANSESOPHAGEAL ECHOCARDIOGRAM (TEE);  Surgeon: Chilton Si, MD;  Location: [facility] ENDOSCOPY;  Service: Cardiovascular;  Laterality: N/A;   Past Medical History:  Diagnosis Date  . Hypertension   . PFO (patent foramen ovale) 07/19/2020   Noted on TEE 07/19/2020   There were no vitals taken for this visit.  Opioid Risk Score:   Fall Risk Score:  `1  Depression screen PHQ 2/9  Depression screen PHQ 2/9 08/25/2020  Decreased Interest 0  Down, Depressed,  Hopeless 0  PHQ - 2 Score 0  Altered sleeping 0  Tired, decreased energy 0  Change in appetite 0  Feeling bad or failure about yourself  0  Trouble concentrating 0  Moving slowly or fidgety/restless 0  Suicidal thoughts 0  PHQ-9 Score 0    Review of Systems  Constitutional: Negative.   HENT: Negative.   Respiratory: Positive for cough.   Cardiovascular: Negative.   Gastrointestinal: Negative.   Endocrine: Negative.   Genitourinary: Positive for difficulty urinating.  Musculoskeletal: Positive for gait problem.  Skin: Negative.   Allergic/Immunologic: Negative.   Hematological: Negative.   Psychiatric/Behavioral: Negative.   All other systems reviewed and are negative.      Objective:   Physical Exam Vitals and nursing note reviewed.  Constitutional:      Appearance: Normal appearance.  Cardiovascular:     Rate and Rhythm: Normal rate and regular rhythm.     Pulses: Normal pulses.     Heart sounds: Normal heart sounds.  Pulmonary:     Effort:  Pulmonary effort is normal.     Breath sounds: Normal breath sounds.  Musculoskeletal:     Cervical back: Normal range of motion and neck supple.     Comments: Normal Muscle Bulk and Muscle Testing Reveals:  Upper Extremities: Full ROM and Muscle Strength on Right 4/5 and Left 5/5 Lower Extremities: Full ROM and Muscle Strength 5/5 Arises from Table with ease using walker for support  Gait Steady  Skin:    General: Skin is warm and dry.  Neurological:     Mental Status: She is alert and oriented to person, place, and time.  Psychiatric:        Mood and Affect: Mood normal.        Behavior: Behavior normal.           Assessment & Plan:  1. SAH Left Frontal Parafalcine, present on admission with infarcts: [facility] Following. Continue current medication regimen. Continue to Monitor. 2. Essential Hypertension: Continue current medication regimen. PCP Following. Continue to Monitor.  3. Hyperlipidemia: Continue  current medication regimen. PCP following. Continue to Monitor.   20  minutes of face to face patient care time was spent during this visit. All questions were encouraged and answered.  F/U with Dr Riley Kill in 4- 6 weeks

## 2020-08-26 ENCOUNTER — Telehealth: Payer: Self-pay

## 2020-08-26 NOTE — Telephone Encounter (Signed)
Spoke multiple times to IT and HIM's today-- med record are open and full abstract was uploaded to my chart - suggested opening on a computer instead of phone technology may be different - also advised HIM sent entire transcript to ptn email on [phone]- LVM if either needs to return call for clarity my number-- should be able to access all records

## 2020-08-31 ENCOUNTER — Ambulatory Visit (INDEPENDENT_AMBULATORY_CARE_PROVIDER_SITE_OTHER): Payer: BC Managed Care – PPO

## 2020-08-31 DIAGNOSIS — I639 Cerebral infarction, unspecified: Secondary | ICD-10-CM

## 2020-08-31 DIAGNOSIS — Q211 Atrial septal defect: Secondary | ICD-10-CM

## 2020-08-31 DIAGNOSIS — I4891 Unspecified atrial fibrillation: Secondary | ICD-10-CM

## 2020-08-31 DIAGNOSIS — Q2112 Patent foramen ovale: Secondary | ICD-10-CM

## 2020-09-01 ENCOUNTER — Ambulatory Visit (HOSPITAL_COMMUNITY)
Admission: RE | Admit: 2020-09-01 | Discharge: 2020-09-01 | Disposition: A | Discharge status: Home / Self Care | Payer: BC Managed Care – PPO | Source: Ambulatory Visit | Attending: Student | Admitting: Student

## 2020-09-01 ENCOUNTER — Ambulatory Visit: Payer: BC Managed Care – PPO | Admitting: Physical Therapy

## 2020-09-01 ENCOUNTER — Other Ambulatory Visit: Payer: Self-pay

## 2020-09-01 DIAGNOSIS — I69351 Hemiplegia and hemiparesis following cerebral infarction affecting right dominant side: Secondary | ICD-10-CM

## 2020-09-01 DIAGNOSIS — R2681 Unsteadiness on feet: Secondary | ICD-10-CM

## 2020-09-01 DIAGNOSIS — I63522 Cerebral infarction due to unspecified occlusion or stenosis of left anterior cerebral artery: Secondary | ICD-10-CM

## 2020-09-01 DIAGNOSIS — R2689 Other abnormalities of gait and mobility: Secondary | ICD-10-CM

## 2020-09-01 DIAGNOSIS — M6281 Muscle weakness (generalized): Secondary | ICD-10-CM

## 2020-09-01 NOTE — Consult Note (Signed)
Chief Complaint: Patient was seen in consultation today for follow-up to ischemic stroke on 07/16/2020  Referring Physician(s)D.[facility]   History of Present Illness: Tanya Lopez is a 49 y.o. female with a past medical history as below, with pertinent past medical history including hypertension, who was admitted on 07/16/2020 with acute onset of symptoms of right-sided numbness, dysarthria, aphasia with minimal speech output, and right sided hemiplegia.  A CT of the brain at the time revealed a right frontal subarachnoid hemorrhage.  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  Diagnostic arteriogram performed revealed smooth focal areas of narrowing of the left anterior cerebral A2 segment and branches of A2 questionable for dissection without evidence of FILLING DEFECTS.  AN MRI/MRA OF THE BRAIN REVEALED A LEFT ANTERIOR CEREBRAL ARTERY ISCHEMIC INFARCT A SMALL FOCAL RIGHT CEREBELLAR ISCHEMIC INFARCT, AND A LEFT OCCIPITAL AND RIGHT THALAMIC INFARCT.  Transesophageal echocardiogram revealed no heart motion abnormalities.  However did reveal a PFO with a right to left shunt.  DVT was excluded by negative DVT study.  Thereafter, patient underwent extensive inpatient and outpatient OT PT which she is continuing.  According to the husband accompanied patient, she has made significant improvement neurologically in terms of his near complete resolution of her speech difficulty, expressive aphasia and right-sided weakness prepared.  She reports occasional intermittent word finding difficulties.  However her speech is clear.  She reports a fine motor movement difficulties and schedule that with her right hand.  In her lower extremity on the right, the patient is mild difficulty with lifting her heel.  However she ambulates without aids and independently with  only 1 fall reported since since the acute neurologic event.  She denies any headaches nausea vomiting visual aberrations, blindness, diplopia, hearing difficulties, swallowing difficulties or taste abnormalities.  No history of loss of consciousness, or of seizures.  Denies any history of shortness of breath, chest pains palpitations or pedal edema.  Denies any coughing, wheezing or sputum production.  Appetite is normal.  Weight is steady.  Denies any abdominal pain constipation diarrhea or melena. Denies any dysuria, hematuria, polyuria.  She denies any recent fever chills or generalized malaise.    Past Medical History:  Diagnosis Date  . Hypertension   . PFO (patent foramen ovale) 07/19/2020   Noted on TEE 07/19/2020    Past Surgical History:  Procedure Laterality Date  . IR ANGIO INTRA EXTRACRAN SEL COM CAROTID INNOMINATE BILAT MOD SED  07/17/2020  . IR ANGIO VERTEBRAL SEL VERTEBRAL BILAT MOD SED  07/17/2020  . IR CT HEAD LTD  07/17/2020  . RADIOLOGY WITH ANESTHESIA N/A 07/16/2020   Procedure: IR WITH ANESTHESIA;  Surgeon: Julieanne Cotton, MD;  Location: [facility] OR;  Service: Radiology;  Laterality: N/A;  . TEE WITHOUT CARDIOVERSION N/A 07/19/2020   Procedure: TRANSESOPHAGEAL ECHOCARDIOGRAM (TEE);  Surgeon: Chilton Si, MD;  Location: [facility] ENDOSCOPY;  Service: Cardiovascular;  Laterality: N/A;    Allergies: Patient has no known allergies.  Medications: Prior to Admission medications   Medication Sig Start Date End Date Taking? Authorizing Provider  acetaminophen (TYLENOL) 325 MG tablet Take 2 tablets (650 mg total) by mouth every 4 (four) hours as needed for mild pain (or temp > 37.5 C (99.5 F)). 07/20/20   Layne Benton, NP  amLODipine (NORVASC) 10 MG tablet Take 1 tablet (10 mg total) by mouth daily. 08/10/20  AngiulliMcarthur Rossetti, PA-C  aspirin EC 325 MG EC tablet Take 1 tablet (325 mg total) by mouth daily. 07/21/20   Layne Benton, NP  atorvastatin (LIPITOR) 80 MG tablet  Take 1 tablet (80 mg total) by mouth daily. 08/10/20   Angiulli, Mcarthur Rossetti, PA-C  clopidogrel (PLAVIX) 75 MG tablet Take 1 tablet (75 mg total) by mouth daily. 08/10/20   Angiulli, Mcarthur Rossetti, PA-C  nebivolol (BYSTOLIC) 5 MG tablet Take 1 tablet (5 mg total) by mouth daily. 08/10/20   Angiulli, Mcarthur Rossetti, PA-C     Family History  Problem Relation Age of Onset  . Stroke Mother   . High blood pressure Mother   . High blood pressure Father     Social History   Socioeconomic History  . Marital status: Married    Spouse name: Not on file  . Number of children: Not on file  . Years of education: Not on file  . Highest education level: Not on file  Occupational History  . Not on file  Tobacco Use  . Smoking status: Never Smoker  . Smokeless tobacco: Never Used  Vaping Use  . Vaping Use: Never used  Substance and Sexual Activity  . Alcohol use: Never  . Drug use: Never  . Sexual activity: Not on file  Other Topics Concern  . Not on file  Social History Narrative  . Not on file   Social Determinants of Health   Financial Resource Strain:   . Difficulty of Paying Living Expenses: Not on file  Food Insecurity:   . Worried About Programme researcher, broadcasting/film/video in the Last Year: Not on file  . Ran Out of Food in the Last Year: Not on file  Transportation Needs:   . Lack of Transportation (Medical): Not on file  . Lack of Transportation (Non-Medical): Not on file  Physical Activity:   . Days of Exercise per Week: Not on file  . Minutes of Exercise per Session: Not on file  Stress:   . Feeling of Stress : Not on file  Social Connections:   . Frequency of Communication with Friends and Family: Not on file  . Frequency of Social Gatherings with Friends and Family: Not on file  . Attends Religious Services: Not on file  . Active Member of Clubs or Organizations: Not on file  . Attends Banker Meetings: Not on file  . Marital Status: Not on file     Review of Systems: A 12 point  ROS discussed and pertinent positives are indicated in the HPI above.  All other systems are negative.  Review of Systems as above  Vital Signs: There were no vitals taken for this visit.  Physical Exam.  Patient   No acute distress.  Affect normal.  Responses and behavior appropriate.  Speech and comprehension intact.  Eyes not tested.  No significant facial asymmetry appreciated.  Tongue midline.  Motor no pronation drift I will follow as for hands.  Fine motor movements of fingers nerves symmetric bilaterally.  Right upper extremity strength 5-/5 proximally distally.  Right lower extremity strength proximal 4/5 distal 3+/5 dorsi and plantar flexion. Coordination intact.  Station gait slight limp to the right on walking without falling.  Balance intact.   Imaging: No results found.  Labs:  CBC: Recent Labs    07/21/20 0442 07/25/20 0614 08/01/20 0449 08/08/20 0543  WBC 6.3 6.4 6.6 6.6  HGB 11.8* 11.9* 12.3 11.5*  HCT 38.9 38.3 39.3 37.1  PLT 363 347 354 295    COAGS: Recent Labs    07/16/20 2204  INR 0.9  APTT 30    BMP: Recent Labs    07/21/20 0442 07/25/20 0614 08/01/20 0449 08/08/20 0543  NA 139 137 137 139  K 3.5 3.6 3.8 3.7  CL 104 106 104 105  CO2 25 23 22 22   GLUCOSE 107* 109* 107* 109*  BUN 8 12 18 15   CALCIUM 9.0 9.1 9.5 9.4  CREATININE 0.72 0.71 0.88 0.85  GFRNONAA >60 >60 >60 >60  GFRAA >60 >60 >60 >60    LIVER FUNCTION TESTS: Recent Labs    07/16/20 2204 07/21/20 0442  BILITOT 0.8 0.6  AST 34 25  ALT 22 22  ALKPHOS 66 54  PROT 7.7 7.2  ALBUMIN 3.7 3.4*    TUMOR MARKERS: No results for input(s): AFPTM, CEA, CA199, CHROMGRNA in the last 8760 hours.  Assessment and Plan: Patient recent CT angiogram of the head and neck from outside institution were reviewed.  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  Grossly there continues to be attenuated caliber  with irregularity of the left anterior cerebral artery extending into the proximal pericallosal artery.  The A1 segment on the left is widely patent seen on previous studies Plan for follow-up.  Suggested CT angiogram of the head and neck in 6 months, with good contrast bolus timing with special attention to the anterior cerebral and middle cerebral arteries bilaterally.  Informed patient that our schedulers will call to schedule the CT angiogram of the head and neck in 6 months from the most recent study. All questions answered and concerns addressed. Patient conveys understanding and agrees with plan.  Thank you for this interesting consult.  I greatly enjoyed meeting TAVONNA WORTHINGTON and look forward to participating in their care.  A copy of this report was sent to the requesting provider on this date.  Electronically Signed: 07/23/20, MD 09/01/2020, 2:14 PM   I spent a total of 40 minutes in face to face  clinical consultation, greater than 50% of which was counseling/coordinating care for post ischemic recovery, discussion involving  potential etiologies of the ischemic stroke and reviewing medical electronic chart for neuroimaging studies since the initial  ischemic stroke event.

## 2020-09-01 NOTE — Therapy (Signed)
[facility] [address] [place], [place], [address] Phone: [phone]   Fax:  [phone]  Physical Therapy Treatment  Patient Details  Name: Tanya RuddyJennifer D Baise MRN: [id number] Date of Birth: 10/17/1971 Referring Provider (PT): Mariam Dollaraniel Angiulli, PA-C. Followed up by: Faith RogueZachary Swartz, MD   Encounter Date: 09/01/2020   PT End of Session - 09/01/20 1850    Visit Number 5    Number of Visits 17    Date for PT Re-Evaluation 11/10/20   POC for 8 weeks, Cert for 90 days   Authorization Type [employer] (30 visit limit between OT/PT)    PT Start Time 0845    PT Stop Time 0928    PT Time Calculation (min) 43 min    Equipment Utilized During Treatment Gait belt    Activity Tolerance Patient tolerated treatment well    Behavior During Therapy WFL for tasks assessed/performed;Flat affect           Past Medical History:  Diagnosis Date  . Hypertension   . PFO (patent foramen ovale) 07/19/2020   Noted on TEE 07/19/2020    Past Surgical History:  Procedure Laterality Date  . IR ANGIO INTRA EXTRACRAN SEL COM CAROTID INNOMINATE BILAT MOD SED  07/17/2020  . IR ANGIO VERTEBRAL SEL VERTEBRAL BILAT MOD SED  07/17/2020  . IR CT HEAD LTD  07/17/2020  . RADIOLOGY WITH ANESTHESIA N/A 07/16/2020   Procedure: IR WITH ANESTHESIA;  Surgeon: Julieanne Cottoneveshwar, Sanjeev, MD;  Location: [facility] OR;  Service: Radiology;  Laterality: N/A;  . TEE WITHOUT CARDIOVERSION N/A 07/19/2020   Procedure: TRANSESOPHAGEAL ECHOCARDIOGRAM (TEE);  Surgeon: Chilton Siandolph, Tiffany, MD;  [facility];  Service: Cardiovascular;  Laterality: N/A;    There were no vitals filed for this visit.   Subjective Assessment - 09/01/20 0849    Subjective Was at [facility] last week and got a heart monitor - will be wearing it for a month. No falls. Has not been walking with anything at home.    Patient is accompained by: Family member   spouse in lobby   Pertinent History PMH: Hypertension,  Patient Foramen Ovale    Limitations Walking    Patient Stated Goals To be able to drive; to be able to talk    Currently in Pain? No/denies              [facility] PT Assessment - 09/01/20 0855      Functional Gait  Assessment   Gait assessed  Yes    Gait Level Surface Walks 20 ft, slow speed, abnormal gait pattern, evidence for imbalance or deviates 10-15 in outside of the 12 in walkway width. Requires more than 7 sec to ambulate 20 ft.   8.01 seconds   Change in Gait Speed Makes only minor adjustments to walking speed, or accomplishes a change in speed with significant gait deviations, deviates 10-15 in outside the 12 in walkway width, or changes speed but loses balance but is able to recover and continue walking.    Gait with Horizontal Head Turns Performs head turns smoothly with slight change in gait velocity (eg, minor disruption to smooth gait path), deviates 6-10 in outside 12 in walkway width, or uses an assistive device.    Gait with Vertical Head Turns Performs task with slight change in gait velocity (eg, minor disruption to smooth gait path), deviates 6 - 10 in outside 12 in walkway width or uses assistive device    Gait and Pivot Turn Pivot turns safely  in greater than 3 sec and stops with no loss of balance, or pivot turns safely within 3 sec and stops with mild imbalance, requires small steps to catch balance.    Step Over Obstacle Is able to step over one shoe box (4.5 in total height) without changing gait speed. No evidence of imbalance.    Gait with Narrow Base of Support Ambulates 7-9 steps.    Gait with Eyes Closed Walks 20 ft, slow speed, abnormal gait pattern, evidence for imbalance, deviates 10-15 in outside 12 in walkway width. Requires more than 9 sec to ambulate 20 ft.   9.6 seconds   Ambulating Backwards Walks 20 ft, uses assistive device, slower speed, mild gait deviations, deviates 6-10 in outside 12 in walkway width.    Steps Alternating feet, no rail.    Total  Score 18    FGA comment: 18/30                Access Code: [email or web address] URL: [email or web address] Date: 09/01/2020 Prepared by: Sherlie Ban  New additions bolded below:   Exercises Sit to Stand - 1 x daily - 5 x weekly - 3 sets - 10 reps Supine Bridge - 1 x daily - 5 x weekly - 3 sets - 10 reps Clamshell - 1 x daily - 5 x weekly - 3 sets - 10 reps Seated Toe Raise - 1 x daily - 5 x weekly - 2 sets - 10 reps Standing Marching - 1-2 x daily - 5 x weekly - 3 sets - next to counter, cues for slowed and controlled and incr R hip flexion  Tandem Stance on Foam Pad with Eyes Open - 1 x daily - 5 x weekly - 3 sets - 5 reps - on 2 pillows performing x5 head turns            [facility] Adult PT Treatment/Exercise - 09/01/20 0909      Ambulation/Gait   Ambulation/Gait Yes    Ambulation/Gait Assistance 5: Supervision    Ambulation/Gait Assistance Details ambulated with no AD and no AFO on RLE, pt with no LOB throughout session and pt with heel strike and active DF with RLE, cleared pt to no longer bring RW into therapy    Ambulation Distance (Feet) 230 Feet   plus additional distances throughout session   Assistive device None    Gait Pattern Step-through pattern;Decreased arm swing - right    Ambulation Surface Level;Indoor    Stairs Yes    Stairs Assistance 5: Supervision    Stair Management Technique No rails;Forwards;Alternating pattern    Number of Stairs 4    Height of Stairs 6               Balance Exercises - 09/01/20 0001      Balance Exercises: Standing   Standing Eyes Closed Narrow base of support (BOS);Foam/compliant surface    Standing Eyes Closed Limitations standing on 2 floor pillows: 2 x 30 seconds eyes closed, 2 x 10 reps eyes closed head turns and 2 x 10 reps head nods with eyes closed    Rockerboard Anterior/posterior;EC;Limitations    Rockerboard Limitations 4 reps - progressing up to 30 seconds with min guard/min A for balance               PT Education - 09/01/20 1849    Education Details results of FGA, ambulating with no AD into therapy, new additions to HEP    Person(s) Educated Patient;Spouse    Methods  Explanation;Demonstration;Handout    Comprehension Verbalized understanding;Returned demonstration            PT Short Term Goals - 08/15/20 1202      PT SHORT TERM GOAL #1   Title Patient will be independent with Initial HEP for strenghtening/balance (All STG due: 09/09/2020)    Baseline no HEP established    Time 4    Period Weeks    Status New    Target Date 09/09/20      PT SHORT TERM GOAL #2   Title Patient will demo ability to ambulate >500 ft on indoor surfaces with LRAD and Supervision    Baseline 100, Min Guard    Time 4    Period Weeks    Status New      PT SHORT TERM GOAL #3   Title Patient will undergo further Balance Assessment of BERG and LTG set    Baseline 44/56 on 8/23    Time 4    Period Weeks    Status Achieved    Target Date 09/09/20             PT Long Term Goals - 09/01/20 1853      PT LONG TERM GOAL #1   Title Patient will be independent with Final HEP (all LTGs due: 10/07/2020)    Baseline no HEP established    Time 8    Period Weeks    Status New      PT LONG TERM GOAL #2   Title Patient will improve Berg Balance by 5 points from baseline to demonstrate improved balance and reduced fall risk    Baseline 44/56 on 8/23    Time 8    Period Weeks    Status New      PT LONG TERM GOAL #3   Title Patient will demonsrate ability to ascend/descend stairs 4 stairs at supervision level without UE support    Baseline ascend/descend with RW, Min Guard    Time 8    Period Weeks    Status New      PT LONG TERM GOAL #4   Title Patient will improve TUG <12 seconds with LRAD to demo reduced fall risk    Baseline 18.56 secs    Time 8    Period Weeks    Status New      PT LONG TERM GOAL #5   Title Patient will improve 5x sit<> stand to <12 seconds with UE support  to demonstrate improved functional strength and mobility    Baseline 15.97 secs    Time 8    Period Weeks    Status New      Additional Long Term Goals   Additional Long Term Goals Yes      PT LONG TERM GOAL #6   Title Patient will improve gait speed to 2.5 ft/sec to demonstrate improved community ambulation    Baseline 1.90 ft/sec    Time 8    Period Weeks    Status New      PT LONG TERM GOAL #7   Title Pt will incr FGA score to at least a 24/30 in order to demo decr fall risk.    Baseline 18/30 on 09/01/20    Time 8    Period Weeks    Status New                 Plan - 09/01/20 1850    Clinical Impression Statement Pt able to ambulate with no  AD and no R AFO with supervision throughout session. Performed the FGA with no AD with pt scoring an 18/30, indicating pt is at a high fall risk - added a LTG for higher level balance test. Remainder of session focused on standing balance strategies with new balance additions to HEP. Pt is making excellent progress with PT. Will continue to progress towards LTGs.    Personal Factors and Comorbidities Comorbidity 2    Comorbidities HTN, Patent Foramen Ovale    Examination-Activity Limitations Stairs;Transfers;Stand;Bed Mobility    Examination-Participation Restrictions Community Activity;Driving;Yard Work    Conservation officer, historic buildings Evolving/Moderate complexity    Rehab Potential Good    PT Frequency 2x / week    PT Duration 8 weeks    PT Treatment/Interventions ADLs/Self Care Home Management;Aquatic Therapy;Electrical Stimulation;Moist Heat;DME Instruction;Gait training;Stair training;Functional mobility training;Therapeutic activities;Therapeutic exercise;Balance training;Neuromuscular re-education;Patient/family education;Orthotic Fit/Training;Manual techniques;Passive range of motion;Energy conservation    PT Next Visit Plan dynamic gait training with no AD (also maybe assess outdoor surfaces), high level balance on  compliant surfaces, add/update strengthening to pt's HEP    Consulted and Agree with Plan of Care Patient;Family member/caregiver    Family Member Consulted Husband           Patient will benefit from skilled therapeutic intervention in order to improve the following deficits and impairments:  Abnormal gait, Decreased balance, Decreased endurance, Decreased mobility, Difficulty walking, Decreased range of motion, Decreased activity tolerance, Decreased knowledge of use of DME, Decreased coordination, Decreased strength  Visit Diagnosis: Hemiplegia and hemiparesis following cerebral infarction affecting right dominant side (HCC)  Muscle weakness (generalized)  Other abnormalities of gait and mobility  Unsteadiness on feet     Problem List Patient Active Problem List   Diagnosis Date Noted  . SAH (subarachnoid hemorrhage) (HCC) - L frontal parafalcine, present on admission w/ infarcts 07/20/2020  . 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 Essential hypertension 07/20/2020  . Hyperlipidemia 07/20/2020  . Urinary retention 07/20/2020  . Obesity 07/20/2020  . Hypokalemia 07/20/2020  . Hyperglycemia   . PFO (patent foramen ovale) 07/19/2020  . Acute L ACA, R cerebellar, L temporoparietal infarcts (HCC)  07/17/2020  . Neurologic abnormality   . Stroke (cerebrum) (HCC) 07/16/2020    Drake Leach , PT, DPT  09/01/2020, 6:54 PM  [facility] [address] [place], [place], [address] Phone: [phone]   Fax:  [phone]  Name: JASIRA ROBINSON MRN: [id number] Date of Birth: May 17, 1971

## 2020-09-01 NOTE — Patient Instructions (Signed)
Access Code: [email or web address] URL: [email or web address] Date: 09/01/2020 Prepared by: Sherlie Ban  Exercises Sit to Stand - 1 x daily - 5 x weekly - 3 sets - 10 reps Supine Bridge - 1 x daily - 5 x weekly - 3 sets - 10 reps Clamshell - 1 x daily - 5 x weekly - 3 sets - 10 reps Seated Toe Raise - 1 x daily - 5 x weekly - 2 sets - 10 reps Standing Marching - 1-2 x daily - 5 x weekly - 3 sets Tandem Stance on Foam Pad with Eyes Open - 1 x daily - 5 x weekly - 3 sets - 5 reps

## 2020-09-05 ENCOUNTER — Ambulatory Visit: Payer: BC Managed Care – PPO | Admitting: Speech Pathology

## 2020-09-05 ENCOUNTER — Other Ambulatory Visit: Payer: Self-pay

## 2020-09-05 ENCOUNTER — Ambulatory Visit: Payer: BC Managed Care – PPO

## 2020-09-05 DIAGNOSIS — M6281 Muscle weakness (generalized): Secondary | ICD-10-CM

## 2020-09-05 DIAGNOSIS — I69351 Hemiplegia and hemiparesis following cerebral infarction affecting right dominant side: Secondary | ICD-10-CM | POA: Diagnosis not present

## 2020-09-05 DIAGNOSIS — R2689 Other abnormalities of gait and mobility: Secondary | ICD-10-CM

## 2020-09-05 DIAGNOSIS — R2681 Unsteadiness on feet: Secondary | ICD-10-CM

## 2020-09-05 DIAGNOSIS — R4701 Aphasia: Secondary | ICD-10-CM

## 2020-09-05 NOTE — Therapy (Signed)
[facility] [address] [place], [place], [address] Phone: [phone]   Fax:  [phone]  Physical Therapy Treatment  Patient Details  Name: Tanya Lopez MRN: [id number] Date of Birth: February 14, 1971 Referring Provider (PT): Mariam Dollar, PA-C. Followed up by: Faith Rogue, MD   Encounter Date: 09/05/2020   PT End of Session - 09/05/20 0849    Visit Number 6    Number of Visits 17    Date for PT Re-Evaluation 11/10/20   POC for 8 weeks, Cert for 90 days   Authorization Type [employer] (30 visit limit between OT/PT)    PT Start Time 0845    PT Stop Time 0928    PT Time Calculation (min) 43 min    Equipment Utilized During Treatment Gait belt    Activity Tolerance Patient tolerated treatment well    Behavior During Therapy WFL for tasks assessed/performed;Flat affect           Past Medical History:  Diagnosis Date  . Hypertension   . PFO (patent foramen ovale) 07/19/2020   Noted on TEE 07/19/2020    Past Surgical History:  Procedure Laterality Date  . IR ANGIO INTRA EXTRACRAN SEL COM CAROTID INNOMINATE BILAT MOD SED  07/17/2020  . IR ANGIO VERTEBRAL SEL VERTEBRAL BILAT MOD SED  07/17/2020  . IR CT HEAD LTD  07/17/2020  . RADIOLOGY WITH ANESTHESIA N/A 07/16/2020   Procedure: IR WITH ANESTHESIA;  Surgeon: Julieanne Cotton, MD;  Location: [facility] OR;  Service: Radiology;  Laterality: N/A;  . TEE WITHOUT CARDIOVERSION N/A 07/19/2020   Procedure: TRANSESOPHAGEAL ECHOCARDIOGRAM (TEE);  Surgeon: Chilton Si, MD;  Location: [facility] ENDOSCOPY;  Service: Cardiovascular;  Laterality: N/A;    There were no vitals filed for this visit.   Subjective Assessment - 09/05/20 0848    Subjective Patient reports no new changes/complaints since last visit. Patient reports that walking without anything or brace is going well. No falls. No pain.    Patient is accompained by: Family member   spouse in lobby   Pertinent History PMH:  Hypertension, Patient Foramen Ovale    Limitations Walking    Patient Stated Goals To be able to drive; to be able to talk    Currently in Pain? No/denies                             [facility] Adult PT Treatment/Exercise - 09/05/20 0001      Ambulation/Gait   Ambulation/Gait Yes    Ambulation/Gait Assistance 5: Supervision    Ambulation/Gait Assistance Details completed ambulation on indoors and outdoor surfaces including pavement/grass surfaces. On grass surfaces paitent demo more cautious gait and slowed gait speed. patient demo 2 instances of catching toe on pavement but able to maintain balance without assistance from PT. All ambulation completed without AD and no AFO donned. no increased fatigue noted at end of ambulation.     Ambulation Distance (Feet) 1000 Feet    Assistive device None    Gait Pattern Step-through pattern;Decreased arm swing - right    Ambulation Surface Level;Indoor      Exercises   Exercises Ankle    Other Exercises  for R ankle strengthening: completed seated resisted ankle with yellow theraband, 1 x 10 reps. vebral cues for technique, and educated on proper completion as addition to HEP. Completed standing toe raises 2 x 10 reps, with light UE support in // bars.  Balance Exercises - 09/05/20 0001      Balance Exercises: Standing   Tandem Stance Eyes open;Foam/compliant surface;2 reps;30 secs;Limitations    Tandem Stance Time 30    Rockerboard Anterior/posterior;EO;Limitations;Intermittent UE support    Rockerboard Limitations completed alternating toe taps to colored pebbles 1 x 15 reps bilaterally forward, then progressed to complete x 15 reps of crossover. initially completing with light UE suppot and progressed to no UE support.     Balance Beam Standing across blue balance beam, compelted EC 3 x 30 seconds. Progressed to completing horizontal/vertical head turns with eyes closed 2 x 10 reps.     Tandem Gait  Forward;Limitations;Foam/compliant surface;3 reps    Tandem Gait Limitations completed down and back x 3 reps on blue balance beam without UE support.     Sidestepping Foam/compliant support;3 reps;Limitations    Sidestepping Limitations completed side stepping along blue balance beam without UE support, down and back x 3 reps. verbal cues for improved step length.     Other Standing Exercises Comments all balance activities completed without AFO donned.              PT Education - 09/05/20 0941    Education Details Updated HEP to include ankle strengthening (see patient instructions)    Person(s) Educated Patient;Spouse    Methods Explanation;Demonstration;Handout    Comprehension Returned demonstration;Verbalized understanding          Access Code: [email or web address] URL: [email or web address] Date: 09/05/2020 Prepared by: Jethro Bastos  Exercises Sit to Stand - 1 x daily - 5 x weekly - 3 sets - 10 reps Supine Bridge - 1 x daily - 5 x weekly - 3 sets - 10 reps Clamshell - 1 x daily - 5 x weekly - 3 sets - 10 reps Standing Marching - 1-2 x daily - 5 x weekly - 3 sets Tandem Stance on Foam Pad with Eyes Open - 1 x daily - 5 x weekly - 3 sets - 5 reps Seated Ankle Dorsiflexion with Anchored Resistance - 1 x daily - 5 x weekly - 2 sets - 10 reps Standing Toe Raises with Support - 1 x daily - 5 x weekly - 3 sets - 10 reps    PT Short Term Goals - 08/15/20 1202      PT SHORT TERM GOAL #1   Title Patient will be independent with Initial HEP for strenghtening/balance (All STG due: 09/09/2020)    Baseline no HEP established    Time 4    Period Weeks    Status New    Target Date 09/09/20      PT SHORT TERM GOAL #2   Title Patient will demo ability to ambulate >500 ft on indoor surfaces with LRAD and Supervision    Baseline 100, Min Guard    Time 4    Period Weeks    Status New      PT SHORT TERM GOAL #3   Title Patient will undergo further Balance Assessment of BERG  and LTG set    Baseline 44/56 on 8/23    Time 4    Period Weeks    Status Achieved    Target Date 09/09/20             PT Long Term Goals - 09/01/20 1853      PT LONG TERM GOAL #1   Title Patient will be independent with Final HEP (all LTGs due: 10/07/2020)    Baseline no HEP established    Time  8    Period Weeks    Status New      PT LONG TERM GOAL #2   Title Patient will improve Berg Balance by 5 points from baseline to demonstrate improved balance and reduced fall risk    Baseline 44/56 on 8/23    Time 8    Period Weeks    Status New      PT LONG TERM GOAL #3   Title Patient will demonsrate ability to ascend/descend stairs 4 stairs at supervision level without UE support    Baseline ascend/descend with RW, Min Guard    Time 8    Period Weeks    Status New      PT LONG TERM GOAL #4   Title Patient will improve TUG <12 seconds with LRAD to demo reduced fall risk    Baseline 18.56 secs    Time 8    Period Weeks    Status New      PT LONG TERM GOAL #5   Title Patient will improve 5x sit<> stand to <12 seconds with UE support to demonstrate improved functional strength and mobility    Baseline 15.97 secs    Time 8    Period Weeks    Status New      Additional Long Term Goals   Additional Long Term Goals Yes      PT LONG TERM GOAL #6   Title Patient will improve gait speed to 2.5 ft/sec to demonstrate improved community ambulation    Baseline 1.90 ft/sec    Time 8    Period Weeks    Status New      PT LONG TERM GOAL #7   Title Pt will incr FGA score to at least a 24/30 in order to demo decr fall risk.    Baseline 18/30 on 09/01/20    Time 8    Period Weeks    Status New                 Plan - 09/05/20 0943    Clinical Impression Statement Completed gait training without AD and without R AFO donned on indoor/outdoor surfaces. Patient had two instances of scuffing R Toe but able to maintain balance. Continued balance activites and strengthening  targeting R Ankle DF with patient tolerating well. Will continue to progress.    Personal Factors and Comorbidities Comorbidity 2    Comorbidities HTN, Patent Foramen Ovale    Examination-Activity Limitations Stairs;Transfers;Stand;Bed Mobility    Examination-Participation Restrictions Community Activity;Driving;Yard Work    Conservation officer, historic buildings Evolving/Moderate complexity    Rehab Potential Good    PT Frequency 2x / week    PT Duration 8 weeks    PT Treatment/Interventions ADLs/Self Care Home Management;Aquatic Therapy;Electrical Stimulation;Moist Heat;DME Instruction;Gait training;Stair training;Functional mobility training;Therapeutic activities;Therapeutic exercise;Balance training;Neuromuscular re-education;Patient/family education;Orthotic Fit/Training;Manual techniques;Passive range of motion;Energy conservation    PT Next Visit Plan How was HEP additions? dynamic gait training with no AD (also maybe assess outdoor surfaces), high level balance on compliant surfaces, add/update strengthening to pt's HEP    Consulted and Agree with Plan of Care Patient;Family member/caregiver    Family Member Consulted Husband           Patient will benefit from skilled therapeutic intervention in order to improve the following deficits and impairments:  Abnormal gait, Decreased balance, Decreased endurance, Decreased mobility, Difficulty walking, Decreased range of motion, Decreased activity tolerance, Decreased knowledge of use of DME, Decreased coordination, Decreased strength  Visit Diagnosis: Hemiplegia  and hemiparesis following cerebral infarction affecting right dominant side (HCC)  Muscle weakness (generalized)  Other abnormalities of gait and mobility  Unsteadiness on feet     Problem List Patient Active Problem List   Diagnosis Date Noted  . SAH (subarachnoid hemorrhage) (HCC) - L frontal parafalcine, present on admission w/ infarcts 07/20/2020  . 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 Essential hypertension 07/20/2020  . Hyperlipidemia 07/20/2020  . Urinary retention 07/20/2020  . Obesity 07/20/2020  . Hypokalemia 07/20/2020  . Hyperglycemia   . PFO (patent foramen ovale) 07/19/2020  . Acute L ACA, R cerebellar, L temporoparietal infarcts (HCC)  07/17/2020  . Neurologic abnormality   . 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

## 2020-09-05 NOTE — Therapy (Signed)
[facility] [address] [place], [place], [address] Phone: [phone]   Fax:  [phone]  Speech Language Pathology Treatment  Patient Details  Name: Tanya Lopez MRN: [id number] Date of Birth: [birth year] Referring Provider (SLP): Dr. Faith Rogue   Encounter Date: 09/05/2020   End of Session - 09/05/20 1139    Visit Number 4    Number of Visits 17    Date for SLP Re-Evaluation 10/10/20    SLP Start Time 0932    SLP Stop Time  1016    SLP Time Calculation (min) 44 min    Activity Tolerance Patient tolerated treatment well           Past Medical History:  Diagnosis Date  . Hypertension   . PFO (patent foramen ovale) 07/19/2020   Noted on TEE 07/19/2020    Past Surgical History:  Procedure Laterality Date  . IR ANGIO INTRA EXTRACRAN SEL COM CAROTID INNOMINATE BILAT MOD SED  07/17/2020  . IR ANGIO VERTEBRAL SEL VERTEBRAL BILAT MOD SED  07/17/2020  . IR CT HEAD LTD  07/17/2020  . RADIOLOGY WITH ANESTHESIA N/A 07/16/2020   Procedure: IR WITH ANESTHESIA;  Surgeon: Julieanne Cotton, MD;  Location: [facility] OR;  Service: Radiology;  Laterality: N/A;  . TEE WITHOUT CARDIOVERSION N/A 07/19/2020   Procedure: TRANSESOPHAGEAL ECHOCARDIOGRAM (TEE);  Surgeon: Chilton Si, MD;  Location: [facility] ENDOSCOPY;  Service: Cardiovascular;  Laterality: N/A;    There were no vitals filed for this visit.          ADULT SLP TREATMENT - 09/05/20 1103      General Information   Behavior/Cognition Alert;Cooperative;Pleasant mood      Treatment Provided   Treatment provided Cognitive-Linquistic      Cognitive-Linquistic Treatment   Treatment focused on Cognition;Aphasia;Patient/family/caregiver education    Skilled Treatment Tanya Lopez prepared moderately complex meal for her family and noted difficulty managing multiple ingridients and timing meal. She noted she forgot to flip the chidken as she was looking for spices.  Educated her and Tanya Lopez re: cognitive changes in attention and processing after CVA.  Educated her as well in processing complex verbal information and group conversations. We generated compensatory strategies for safety and success in the kitchen and in conversations. Tanya Lopez is talking with her family on face time successfully. She has not seen friends since CVA. Encouraged her to get together with her neighbor to practice conversation and word finidng strategies. Today, verbal expression targeted in conversation re: her favorite vacations. She frequently required pauses for word finding, however was able to acurately describe vacation details. In role play phone call, re: setting up photo shoot, [name] required extended time to ask routine questions and information. Pauses for word finding, as well as usual mod verbal cues to talk in sentences, as Tanya Lopez is more comfortable using short, word responses. For example, I asked if I could bring my dogs to the photoshoot and she replied "yes, toys" which I had to clarify if I was to bring toys or she had them for the dogs. Tanya Lopez reports Tanya Lopez is more quiet at home and prior to CVA was talkative., however uses 1 word answers or yes/no. Generated some activities to practice conversation at home (see pt instructions). As Tanya Lopez broadens her communication circle, she will be more confident.      Assessment / Recommendations / Plan   Plan Continue with current plan of care      Progression Toward Goals   Progression toward goals  Progressing toward goals            SLP Education - 09/05/20 1133    Education Details compensations for attention, aphasia, processing    Person(s) Educated Patient;Spouse    Methods Explanation;Handout    Comprehension Verbalized understanding;Returned demonstration;Verbal cues required;Need further instruction            SLP Short Term Goals - 09/05/20 1138      SLP SHORT TERM GOAL #1   Title Pt will name 10  items in personally relevant categorie swith rare min A    Time 2    Period Weeks    Status On-going      SLP SHORT TERM GOAL #2   Title Pt will utilize compensations for anomia in structured naming tasks with rare min A over 2 sessions    Time 2    Period Weeks    Status On-going      SLP SHORT TERM GOAL #3   Title Pt will ID and correct written errors at sentence level with rare min A over 2 sessions    Time 2    Period Weeks    Status On-going            SLP Long Term Goals - 09/05/20 1139      SLP LONG TERM GOAL #1   Title Pt will utilize compensations for aphasia as needed in 20 minute mildly complex conversation with mod I over 2 sessions    Time 6    Period Weeks    Status On-going      SLP LONG TERM GOAL #2   Title Pt will write or email 2-3 paragraph message self correcting errors with rare min A over 2 sessions    Time 6    Period Weeks    Status On-going      SLP LONG TERM GOAL #3   Title Pt will complete complex naming tasks with 90% accuracy over 2 sessions rare min A    Time 6    Period Weeks    Status On-going            Plan - 09/05/20 1134    Clinical Impression Statement Anomic aphasia persists. [name] is also noting some high level cognitive changes in attention and processing. She benefits from encouragement to broaden her communication circle to improve confidence in conversation. Ongoing trainingn for compensations for cognition and aphasia, as well as maximize communication/word finding to eventual return to profession and QOL.    Speech Therapy Frequency 2x / week    Duration --   17 visits or 8 weeks   Treatment/Interventions Language facilitation;Environmental controls;Cueing hierarchy;SLP instruction and feedback;Compensatory strategies;Functional tasks;Compensatory techniques;Patient/family education;Multimodal communcation approach;Internal/external aids    Potential to Achieve Goals Good           Patient will benefit from  skilled therapeutic intervention in order to improve the following deficits and impairments:   Aphasia    Problem List Patient Active Problem List   Diagnosis Date Noted  . SAH (subarachnoid hemorrhage) (HCC) - L frontal parafalcine, present on admission w/ infarcts 07/20/2020  . 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 Essential hypertension 07/20/2020  . Hyperlipidemia 07/20/2020  . Urinary retention 07/20/2020  . Obesity 07/20/2020  . Hypokalemia 07/20/2020  . Hyperglycemia   . PFO (patent foramen ovale) 07/19/2020  . Acute L ACA, R cerebellar, L temporoparietal infarcts (HCC)  07/17/2020  . Neurologic abnormality   . Stroke (cerebrum) (  HCC) 07/16/2020    Steaven Wholey, Radene Journey MS, CCC-SLP 09/05/2020, 11:40 AM  [facility] [address] [place], [place], [address] Phone: [phone]   Fax:  [phone]   Name: HISAKO BUGH MRN: [id number] Date of Birth: November 30, 1971

## 2020-09-05 NOTE — Patient Instructions (Signed)
Access Code: [email or web address] URL: [email or web address] Date: 09/05/2020 Prepared by: Jethro Bastos  Exercises Sit to Stand - 1 x daily - 5 x weekly - 3 sets - 10 reps Supine Bridge - 1 x daily - 5 x weekly - 3 sets - 10 reps Clamshell - 1 x daily - 5 x weekly - 3 sets - 10 reps Standing Marching - 1-2 x daily - 5 x weekly - 3 sets Tandem Stance on Foam Pad with Eyes Open - 1 x daily - 5 x weekly - 3 sets - 5 reps Seated Ankle Dorsiflexion with Anchored Resistance - 1 x daily - 5 x weekly - 2 sets - 10 reps Standing Toe Raises with Support - 1 x daily - 5 x weekly - 3 sets - 10 reps

## 2020-09-05 NOTE — Patient Instructions (Addendum)
   Maybe look at scrap books or old photos to spur conversation  Describe our trip to.....  Tell me about the holiday plans  It's OK to write down what you want to say before you make a phone call  Let the person know that you've had a stroke and need some extra time talking  Aphasia card  Think about simple meals where you only have to do 1 or 2 things at a time  Use timers in the kitchen  When you are on the phone or Face Time - turn off TV and radio - be in a quiet room  Reduce clutter as best you can to help your brain focus  Get a pill organizer - have Fayrene Fearing supervise you when you organize your pills  Set an alarm on you phone to take your meds  Get a regular aspirin  Attention and processing may be affected by your stroke - your brain is healing so stimuli we filter out automatically your brain is working harder to filter (noise, lights, crowds, group conversation)   Have Jocelyn over or go to lunch - consider starting out 1:1, then adding another person

## 2020-09-08 ENCOUNTER — Other Ambulatory Visit: Payer: Self-pay

## 2020-09-08 ENCOUNTER — Ambulatory Visit: Payer: BC Managed Care – PPO | Admitting: Physical Therapy

## 2020-09-08 DIAGNOSIS — M6281 Muscle weakness (generalized): Secondary | ICD-10-CM

## 2020-09-08 DIAGNOSIS — R2689 Other abnormalities of gait and mobility: Secondary | ICD-10-CM

## 2020-09-08 DIAGNOSIS — R2681 Unsteadiness on feet: Secondary | ICD-10-CM

## 2020-09-08 DIAGNOSIS — I69351 Hemiplegia and hemiparesis following cerebral infarction affecting right dominant side: Secondary | ICD-10-CM | POA: Diagnosis not present

## 2020-09-08 NOTE — Therapy (Signed)
[facility] [address] [place], [place], [address] Phone: [phone]   Fax:  [phone]  Physical Therapy Treatment  Patient Details  Name: Tanya Lopez MRN: [id number] Date of Birth: [birth year] Referring Provider (PT): Mariam Dollar, PA-C. Followed up by: Faith Rogue, MD   Encounter Date: 09/08/2020   PT End of Session - 09/08/20 0937    Visit Number 7    Number of Visits 17    Date for PT Re-Evaluation 11/10/20   POC for 8 weeks, Cert for 90 days   Authorization Type [employer] (30 visit limit between OT/PT)    PT Start Time 0847    PT Stop Time 0929    PT Time Calculation (min) 42 min    Equipment Utilized During Treatment Gait belt    Activity Tolerance Patient tolerated treatment well    Behavior During Therapy WFL for tasks assessed/performed;Flat affect           Past Medical History:  Diagnosis Date   Hypertension    PFO (patent foramen ovale) 07/19/2020   Noted on TEE 07/19/2020    Past Surgical History:  Procedure Laterality Date   IR ANGIO INTRA EXTRACRAN SEL COM CAROTID INNOMINATE BILAT MOD SED  07/17/2020   IR ANGIO VERTEBRAL SEL VERTEBRAL BILAT MOD SED  07/17/2020   IR CT HEAD LTD  07/17/2020   RADIOLOGY WITH ANESTHESIA N/A 07/16/2020   Procedure: IR WITH ANESTHESIA;  Surgeon: Julieanne Cotton, MD;  Location: [facility] OR;  Service: Radiology;  Laterality: N/A;   TEE WITHOUT CARDIOVERSION N/A 07/19/2020   Procedure: TRANSESOPHAGEAL ECHOCARDIOGRAM (TEE);  Surgeon: Chilton Si, MD;  Location[facility] ENDOSCOPY;  Service: Cardiovascular;  Laterality: N/A;    There were no vitals filed for this visit.   Subjective Assessment - 09/08/20 0849    Subjective No changes since last visit. No falls. Exercises are going well.    Patient is accompained by: Family member   spouse in lobby   Pertinent History PMH: Hypertension, Patient Foramen Ovale    Limitations Walking    Patient Stated Goals  To be able to drive; to be able to talk    Currently in Pain? No/denies                             [facility] Adult PT Treatment/Exercise - 09/08/20 0001      Ambulation/Gait   Ambulation/Gait Yes    Ambulation/Gait Assistance 5: Supervision    Ambulation/Gait Assistance Details between activities with no AD'    Assistive device None    Gait Pattern Step-through pattern;Decreased arm swing - right    Ambulation Surface Level;Indoor               Balance Exercises - 09/08/20 0001      Balance Exercises: Standing   Standing Eyes Closed Narrow base of support (BOS);Foam/compliant surface;2 reps;30 secs;Limitations    Standing Eyes Closed Limitations standing on thick blue foam pad folded over     SLS Foam/compliant surface;Eyes open;Limitations    SLS Limitations stepping onto blue disc on spikey surface with RLE for SLS and tapping forward cone with LLE x10 reps and then progressing to forward tapping cone and then cross tapping x10 reps for, standing on blue foam with RLE and rolling soccer ball  Forwards and backwards with LLE 3 x 5 reps, cues for slowed and controlled    Rockerboard Anterior/posterior;Lateral;Head turns;EC;EO;Limitations   A/P weight shifting  x10 reps then progressing to holding board still EC 3 x 30 seconds and then performed with head turns 2 x 10 reps and head nods 2 x 10 reps. Performed with board in M/L direction eyes closed 2 x 30 second reps    Tandem Gait Forward;Retro;Intermittent upper extremity support;Foam/compliant surface;3 reps    Tandem Gait Limitations down and back forward and backwards on red/blue mats     Step Over Hurdles / Cones standing on blue/red mats: stepping over 4 taller orange hurdles down and back 3 reps, initial cues for incr hip/knee flexion, then performed additional 2 reps with pt holding leg up in air for 3 seconds for SLS before stepping over obstacle   Other Standing Exercises on blue/red compliant mat: heel  walking down and back x3 reps, intermittent UE support, pt with difficulty keeping R toes up at times               PT Short Term Goals - 08/15/20 1202      PT SHORT TERM GOAL #1   Title Patient will be independent with Initial HEP for strenghtening/balance (All STG due: 09/09/2020)    Baseline no HEP established    Time 4    Period Weeks    Status New    Target Date 09/09/20      PT SHORT TERM GOAL #2   Title Patient will demo ability to ambulate >500 ft on indoor surfaces with LRAD and Supervision    Baseline 100, Min Guard    Time 4    Period Weeks    Status New      PT SHORT TERM GOAL #3   Title Patient will undergo further Balance Assessment of BERG and LTG set    Baseline 44/56 on 8/23    Time 4    Period Weeks    Status Achieved    Target Date 09/09/20             PT Long Term Goals - 09/01/20 1853      PT LONG TERM GOAL #1   Title Patient will be independent with Final HEP (all LTGs due: 10/07/2020)    Baseline no HEP established    Time 8    Period Weeks    Status New      PT LONG TERM GOAL #2   Title Patient will improve Berg Balance by 5 points from baseline to demonstrate improved balance and reduced fall risk    Baseline 44/56 on 8/23    Time 8    Period Weeks    Status New      PT LONG TERM GOAL #3   Title Patient will demonsrate ability to ascend/descend stairs 4 stairs at supervision level without UE support    Baseline ascend/descend with RW, Min Guard    Time 8    Period Weeks    Status New      PT LONG TERM GOAL #4   Title Patient will improve TUG <12 seconds with LRAD to demo reduced fall risk    Baseline 18.56 secs    Time 8    Period Weeks    Status New      PT LONG TERM GOAL #5   Title Patient will improve 5x sit<> stand to <12 seconds with UE support to demonstrate improved functional strength and mobility    Baseline 15.97 secs    Time 8    Period Weeks    Status New  Additional Long Term Goals   Additional Long  Term Goals Yes      PT LONG TERM GOAL #6   Title Patient will improve gait speed to 2.5 ft/sec to demonstrate improved community ambulation    Baseline 1.90 ft/sec    Time 8    Period Weeks    Status New      PT LONG TERM GOAL #7   Title Pt will incr FGA score to at least a 24/30 in order to demo decr fall risk.    Baseline 18/30 on 09/01/20    Time 8    Period Weeks    Status New                 Plan - 09/08/20 1848    Clinical Impression Statement Focus of today's skilled session was balance strategies on compliant surfaces - focus with vision removed, SLS. Pt tolerated session well and demonstrated improvements with balance with incr reps. Needing min A at times for balance. Pt is making excellent progress with PT, will continue to progress towards LTGs.    Personal Factors and Comorbidities Comorbidity 2    Comorbidities HTN, Patent Foramen Ovale    Examination-Activity Limitations Stairs;Transfers;Stand;Bed Mobility    Examination-Participation Restrictions Community Activity;Driving;Yard Work    Conservation officer, historic buildings Evolving/Moderate complexity    Rehab Potential Good    PT Frequency 2x / week    PT Duration 8 weeks    PT Treatment/Interventions ADLs/Self Care Home Management;Aquatic Therapy;Electrical Stimulation;Moist Heat;DME Instruction;Gait training;Stair training;Functional mobility training;Therapeutic activities;Therapeutic exercise;Balance training;Neuromuscular re-education;Patient/family education;Orthotic Fit/Training;Manual techniques;Passive range of motion;Energy conservation    PT Next Visit Plan dynamic gait training with no AD and continue outdoors, high level balance on compliant surfaces, try some BOSU balance, anticipate early d/c due to pt's progress    Consulted and Agree with Plan of Care Patient;Family member/caregiver    Family Member Consulted Husband           Patient will benefit from skilled therapeutic intervention in order  to improve the following deficits and impairments:  Abnormal gait, Decreased balance, Decreased endurance, Decreased mobility, Difficulty walking, Decreased range of motion, Decreased activity tolerance, Decreased knowledge of use of DME, Decreased coordination, Decreased strength  Visit Diagnosis: Muscle weakness (generalized)  Other abnormalities of gait and mobility  Unsteadiness on feet  Hemiplegia and hemiparesis following cerebral infarction affecting right dominant side [facility])     Problem List Patient Active Problem List   Diagnosis Date Noted   SAH (subarachnoid hemorrhage) (HCC) - L frontal parafalcine, present on admission w/ infarcts 07/20/2020   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 [place], [place], [address] Phone: [phone]   Fax:  [phone]  Name: Tanya Lopez MRN: [id number] Date of Birth: 07-26-1971

## 2020-09-09 ENCOUNTER — Ambulatory Visit: Payer: BC Managed Care – PPO

## 2020-09-09 DIAGNOSIS — I69351 Hemiplegia and hemiparesis following cerebral infarction affecting right dominant side: Secondary | ICD-10-CM | POA: Diagnosis not present

## 2020-09-09 DIAGNOSIS — R4701 Aphasia: Secondary | ICD-10-CM

## 2020-09-09 NOTE — Therapy (Signed)
[facility] [address] [place], [place], [address] Phone: [phone]   Fax:  [phone]  Speech Language Pathology Treatment  Patient Details  Name: Tanya Lopez MRN: [id number] Date of Birth: 1971/07/12 Referring Provider (SLP): Dr. Faith Rogue   Encounter Date: 09/09/2020   End of Session - 09/09/20 1631    Visit Number 5    Number of Visits 17    Date for SLP Re-Evaluation 10/10/20    SLP Start Time 1535    SLP Stop Time  1615    SLP Time Calculation (min) 40 min    Activity Tolerance Patient tolerated treatment well;Other (comment)   pt coughed 36x during session (anxiety?)          Past Medical History:  Diagnosis Date   Hypertension    PFO (patent foramen ovale) 07/19/2020   Noted on TEE 07/19/2020    Past Surgical History:  Procedure Laterality Date   IR ANGIO INTRA EXTRACRAN SEL COM CAROTID INNOMINATE BILAT MOD SED  07/17/2020   IR ANGIO VERTEBRAL SEL VERTEBRAL BILAT MOD SED  07/17/2020   IR CT HEAD LTD  07/17/2020   RADIOLOGY WITH ANESTHESIA N/A 07/16/2020   Procedure: IR WITH ANESTHESIA;  Surgeon: Julieanne Cotton, MD;  Location: [facility] OR;  Service: Radiology;  Laterality: N/A;   TEE WITHOUT CARDIOVERSION N/A 07/19/2020   Procedure: TRANSESOPHAGEAL ECHOCARDIOGRAM (TEE);  Surgeon: Chilton Si, MD;  Location[facility] ENDOSCOPY;  Service: Cardiovascular;  Laterality: N/A;    There were no vitals filed for this visit.   Subjective Assessment - 09/09/20 1540    Subjective "Hello"    Currently in Pain? No/denies                 ADULT SLP TREATMENT - 09/09/20 1541      General Information   Behavior/Cognition Alert;Cooperative;Pleasant mood      Treatment Provided   Treatment provided Cognitive-Linquistic      Cognitive-Linquistic Treatment   Treatment focused on Cognition;Aphasia    Skilled Treatment Pt named two of three compensations for anoima. Did ont name  description strategy. SLP worked with Tanya Lopez on compensations for anomia - specifically the description strategy with less-common household items (record, Surveyor, quantity, umbrella, basket, etc) with WFL/WNL descriptions provided extra time. Pt named 10 items in categories with good success, with rare min A.  homework ot talk to 3 friends and meet one of them until next session.      Assessment / Recommendations / Plan   Plan Continue with current plan of care      Progression Toward Goals   Progression toward goals Progressing toward goals              SLP Short Term Goals - 09/09/20 1605      SLP SHORT TERM GOAL #1   Title Pt will name 10 items in personally relevant categorie swith rare min A    Baseline 09-08-20    Time 2    Period Weeks    Status On-going      SLP SHORT TERM GOAL #2   Title Pt will utilize compensations for anomia in structured naming tasks with rare min A over 2 sessions    Time 2    Period Weeks    Status On-going      SLP SHORT TERM GOAL #3   Title Pt will ID and correct written errors at sentence level with rare min A over 2 sessions    Time 2  Period Weeks    Status On-going            SLP Long Term Goals - 09/09/20 1633      SLP LONG TERM GOAL #1   Title Pt will utilize compensations for aphasia as needed in 20 minute mildly complex conversation with mod I over 2 sessions    Time 6    Period Weeks    Status On-going      SLP LONG TERM GOAL #2   Title Pt will write or email 2-3 paragraph message self correcting errors with rare min A over 2 sessions    Time 6    Period Weeks    Status On-going      SLP LONG TERM GOAL #3   Title Pt will complete complex naming tasks with 90% accuracy over 2 sessions rare min A    Time 6    Period Weeks    Status On-going            Plan - 09/09/20 1633    Clinical Impression Statement Anomic aphasia persists. Tanya Lopez is also noting some high level cognitive changes in attention and processing.  She benefits from encouragement to broaden her communication circle to improve confidence in conversation. Ongoing trainingn for compensations for cognition and aphasia, as well as maximize communication/word finding to eventual return to profession and QOL.    Speech Therapy Frequency 2x / week    Duration --   17 visits or 8 weeks   Treatment/Interventions Language facilitation;Environmental controls;Cueing hierarchy;SLP instruction and feedback;Compensatory strategies;Functional tasks;Compensatory techniques;Patient/family education;Multimodal communcation approach;Internal/external aids    Potential to Achieve Goals Good           Patient will benefit from skilled therapeutic intervention in order to improve the following deficits and impairments:   Aphasia    Problem List Patient Active Problem List   Diagnosis Date Noted   SAH (subarachnoid hemorrhage) (HCC) - L frontal parafalcine, present on admission w/ infarcts 07/20/2020   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 [place], [place], [address] Phone: [phone]   Fax:  [phone]   Name: Tanya Lopez MRN: [id number] Date of Birth: 1971-07-04

## 2020-09-12 ENCOUNTER — Ambulatory Visit: Payer: BC Managed Care – PPO | Admitting: Occupational Therapy

## 2020-09-12 ENCOUNTER — Other Ambulatory Visit: Payer: Self-pay

## 2020-09-12 ENCOUNTER — Encounter: Payer: Self-pay | Admitting: Occupational Therapy

## 2020-09-12 DIAGNOSIS — R2681 Unsteadiness on feet: Secondary | ICD-10-CM

## 2020-09-12 DIAGNOSIS — R2689 Other abnormalities of gait and mobility: Secondary | ICD-10-CM

## 2020-09-12 DIAGNOSIS — R41840 Attention and concentration deficit: Secondary | ICD-10-CM

## 2020-09-12 DIAGNOSIS — I69318 Other symptoms and signs involving cognitive functions following cerebral infarction: Secondary | ICD-10-CM

## 2020-09-12 DIAGNOSIS — I69351 Hemiplegia and hemiparesis following cerebral infarction affecting right dominant side: Secondary | ICD-10-CM

## 2020-09-12 NOTE — Therapy (Signed)
[facility] [address] [place], [place], [address] Phone: [phone]   Fax:  [phone]  Occupational Therapy Evaluation  Patient Details  Name: Tanya Lopez MRN: [id number] Date of Birth: [birth year] Referring Provider (OT): Dr. Ivory Broad   Encounter Date: 09/12/2020   OT End of Session - 09/12/20 1523    Visit Number 1    Number of Visits 9    Date for OT Re-Evaluation 10/27/20    Authorization Type [employer] 2021, 30 visit limit combined OT/PT (each visit of each type counts), no auth    Authorization - Visit Number 1    Authorization - Number of Visits 15    OT Start Time 1234    OT Stop Time 1315    OT Time Calculation (min) 41 min    Activity Tolerance Patient tolerated treatment well    Behavior During Therapy [facility] for tasks assessed/performed;Flat affect           Past Medical History:  Diagnosis Date  . Hypertension   . PFO (patent foramen ovale) 07/19/2020   Noted on TEE 07/19/2020    Past Surgical History:  Procedure Laterality Date  . IR ANGIO INTRA EXTRACRAN SEL COM CAROTID INNOMINATE BILAT MOD SED  07/17/2020  . IR ANGIO VERTEBRAL SEL VERTEBRAL BILAT MOD SED  07/17/2020  . IR CT HEAD LTD  07/17/2020  . RADIOLOGY WITH ANESTHESIA N/A 07/16/2020   Procedure: IR WITH ANESTHESIA;  Surgeon: Julieanne Cotton, MD;  Location: [facility] OR;  Service: Radiology;  Laterality: N/A;  . TEE WITHOUT CARDIOVERSION N/A 07/19/2020   Procedure: TRANSESOPHAGEAL ECHOCARDIOGRAM (TEE);  Surgeon: Chilton Si, MD;  Location: [facility] ENDOSCOPY;  Service: Cardiovascular;  Laterality: N/A;    There were no vitals filed for this visit.   Subjective Assessment - 09/12/20 1236    Patient is accompanied by: Family member   husband   Pertinent History Cerebral Infarction 07/16/20.  PMH:  Hypertension, Patent Foramen Ovale, HLD    Patient Stated Goals be able to drive, improve RUE strength    Currently in Pain? No/denies              [facility] OT Assessment - 09/12/20 0001      Assessment   Medical Diagnosis Acute L ACA/MCA CVA    Referring Provider (OT) Dr. Ivory Broad    Onset Date/Surgical Date 07/16/20    Hand Dominance Right    Prior Therapy Inpatient Rehab      Precautions   Precautions Fall    Precaution Comments no driving      Balance Screen   Has the patient fallen in the past 6 months No      Home  Environment   Family/patient expects to be discharged to: Private residence    Lives With Spouse      Prior Function   Level of Independence Independent    Leisure Photography, cook      ADL   ADL comments pt reports performing BADLs mod I      IADL   Prior Level of Function Shopping pt performs prior    Shopping Assistance for transportation   needs assist lifting heavier things   Prior Level of Function Light Housekeeping pt did most prior   not doing as much cleaning due to difficulty getting up/down   Light Housekeeping --   able to wipe counters, put things away, sweep   Prior Level of Function Meal Prep Pt reports that she is doing some  cooking tasks, but difficulty opening containers, lifting pots/pans, supervision for distractions, has not yet chopped    Prior Level of Function Community Mobility independent    Community Mobility Relies on family or friends for transportation    Medication Management --   takes medication, but needs reminders at times   Prior Level of Function Financial Management pt performed    Financial Management --   has been performing, unsure of accuracy?     Mobility   Mobility Status Independent   decreased balance, see PT eval for details     Written Expression   Dominant Hand Right    Handwriting 100% legible      Vision - History   Baseline Vision Wears glasses all the time    Additional Comments denies visual changes from CVA      Cognition   Overall Cognitive Status Impaired/Different from baseline   difficult to determine due to aphasia   Area  of Impairment Memory   occasional difficulty   Attention Selective   pt reports distractions   Cognition Comments Simple environmental scanning with 93% accuracy.  Pt missed 1 item despite 2nd pass (on L side).  Pt with good accuracy with copying cube, clock drawing, and simple trail making       Sensation   Light Touch Appears Intact    Additional Comments pt denies sensory changes      Coordination   9 Hole Peg Test Right;Left    Right 9 Hole Peg Test 21.87      ROM / Strength   AROM / PROM / Strength AROM;Strength      AROM   Overall AROM  Within functional limits for tasks performed    Overall AROM Comments BUEs      Strength   Overall Strength Deficits    Overall Strength Comments L shoulder strength grossly 4- to 4/5, biceps/triceps 4+/5.  LUE proximal strength grossly 5/5.      Hand Function   Right Hand Grip (lbs) 36.5    Left Hand Grip (lbs) 52.4                           OT Education - 09/12/20 1519    Education Details OT eval results/POC    Person(s) Educated Patient    Methods Explanation    Comprehension Verbalized understanding            OT Short Term Goals - 09/12/20 1546      OT SHORT TERM GOAL #1   Title Pt will be independent with HEP for RUE hand strength--check STGs 10/12/20    Time 4    Period Weeks    Status New      OT SHORT TERM GOAL #2   Title Pt will perform environmental scanning in busy, dynamic environment with 100% accuracy for incr safety with driving.    Time 4    Period Weeks    Status New      OT SHORT TERM GOAL #3   Title Pt will be verbalize understanding of memory/cognitive compensation strategies for ADLs/IADLs prn.    Time 4    Period Weeks    Status New      OT SHORT TERM GOAL #4   Title Pt will perform mod complex cooking including chopping/cutting mod I.    Time 8    Period Weeks    Status New      OT SHORT TERM GOAL #  5   Title Pt will perfom mod complex cleaning including reaching high and  low mod I.    Time 8    Period Weeks    Status New             OT Long Term Goals - 09/12/20 1536      OT LONG TERM GOAL #1   Title Pt will be independent with HEP for proximal RUE strength.--check LTGs 10/27/20    Time 6    Period Weeks    Status New      OT LONG TERM GOAL #2   Title Pt will be able to divide attention between at least 1 cognitive and 1 physical task with at least 95% accuracy in prep for driving.    Time 6    Period Weeks    Status New      OT LONG TERM GOAL #3   Title Pt/husband will verbalize understanding of graduated driving recommendations once cleared to drive by physician.    Time 6    Period Weeks    Status New      OT LONG TERM GOAL #4   Title Pt will improve R grip strength by at least 10lbs to assist with opening containers.    Baseline 36.5lbs    Time 6    Period Weeks    Status New                 Plan - 09/12/20 1526    Clinical Impression Statement Pt is a 49 y.o. female s/p cerebral infarction 07/16/20.  Prior to CVA, she was independent with all IADL's. She has a PhD in Copywriter, advertising and was working as a Environmental manager prior to CVA. She enjoys golf andhas a 78 yo daughter and 43 yo daughter.  Pt with PMH that includes: Hypertension, Patent Foramen Ovale, HLD.  Pt presents with cognitive deficits/decr attention, decr RUE strength, decr balance for ADLs/IADLs.  Pt would benefit from occupational therapy to return to prior IADLs, improve RUE strength/functional use.    OT Occupational Profile and History Detailed Assessment- Review of Records and additional review of physical, cognitive, psychosocial history related to current functional performance    Occupational performance deficits (Please refer to evaluation for details): ADL's;IADL's;Leisure;Work    Games developer / Function / Physical Skills ADL;Balance;IADL;Strength;UE functional use;Decreased knowledge of precautions    Cognitive Skills Attention;Memory    Rehab Potential  Good    Clinical Decision Making Several treatment options, min-mod task modification necessary    Comorbidities Affecting Occupational Performance: May have comorbidities impacting occupational performance    Modification or Assistance to Complete Evaluation  No modification of tasks or assist necessary to complete eval    OT Frequency 2x / week    OT Duration 6 weeks   +eval   OT Treatment/Interventions Self-care/ADL training;DME and/or AE instruction;Moist Heat;Balance training;Therapeutic activities;Therapeutic exercise;Cognitive remediation/compensation;Cryotherapy;Neuromuscular education;Functional Mobility Training;Passive range of motion;Patient/family education;Visual/perceptual remediation/compensation    Plan initate HEP for RUE strength; divided attention    Consulted and Agree with Plan of Care Patient;Family member/caregiver    Family Member Consulted husband           Patient will benefit from skilled therapeutic intervention in order to improve the following deficits and impairments:   Body Structure / Function / Physical Skills: ADL, Balance, IADL, Strength, UE functional use, Decreased knowledge of precautions Cognitive Skills: Attention, Memory     Visit Diagnosis: Hemiplegia and hemiparesis following cerebral infarction affecting right dominant side (  HCC)  Unsteadiness on feet  Attention and concentration deficit  Other symptoms and signs involving cognitive functions following cerebral infarction  Other abnormalities of gait and mobility    Problem List Patient Active Problem List   Diagnosis Date Noted  . SAH (subarachnoid hemorrhage) (HCC) - L frontal parafalcine, present on admission w/ infarcts 07/20/2020  . 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 Essential hypertension 07/20/2020  . Hyperlipidemia 07/20/2020  . Urinary retention 07/20/2020  . Obesity 07/20/2020  . Hypokalemia 07/20/2020  . Hyperglycemia   . PFO  (patent foramen ovale) 07/19/2020  . Acute L ACA, R cerebellar, L temporoparietal infarcts (HCC)  07/17/2020  . Neurologic abnormality   . Stroke (cerebrum) [facility](HCC) 07/16/2020    [facility] HospitalFREEMAN,Infant Zink 09/12/2020, 3:53 PM  [facility] [address] [place], [place], [address] Phone: [phone]   Fax:  [phone]  Name: Tanya Lopez MRN: [id number] Date of Birth: 07/17/1971   Willa FraterAngela Tamiki Kuba, OTR/L [facility] [address] [place], [place]  [address] [phone] phone [phone] 09/12/20 3:53 PM

## 2020-09-13 ENCOUNTER — Ambulatory Visit: Payer: BC Managed Care – PPO

## 2020-09-13 DIAGNOSIS — R2689 Other abnormalities of gait and mobility: Secondary | ICD-10-CM

## 2020-09-13 DIAGNOSIS — R4701 Aphasia: Secondary | ICD-10-CM

## 2020-09-13 DIAGNOSIS — R41841 Cognitive communication deficit: Secondary | ICD-10-CM

## 2020-09-13 DIAGNOSIS — M21371 Foot drop, right foot: Secondary | ICD-10-CM

## 2020-09-13 DIAGNOSIS — R2681 Unsteadiness on feet: Secondary | ICD-10-CM

## 2020-09-13 DIAGNOSIS — M6281 Muscle weakness (generalized): Secondary | ICD-10-CM

## 2020-09-13 DIAGNOSIS — I69351 Hemiplegia and hemiparesis following cerebral infarction affecting right dominant side: Secondary | ICD-10-CM | POA: Diagnosis not present

## 2020-09-13 NOTE — Therapy (Signed)
[facility] [address] [place], [place], [address] Phone: [phone]   Fax:  [phone]  Speech Language Pathology Treatment  Patient Details  Name: Tanya Lopez MRN: [id number] Date of Birth: 07-20-1971 Referring Provider (SLP): Dr. Faith Rogue   Encounter Date: 09/13/2020   End of Session - 09/13/20 1230    Visit Number 6    Number of Visits 17    Date for SLP Re-Evaluation 10/10/20    SLP Start Time 1103    SLP Stop Time  1145    SLP Time Calculation (min) 42 min    Activity Tolerance Patient tolerated treatment well;Other (comment)   pt coughed 36x during session (anxiety?)          Past Medical History:  Diagnosis Date  . Hypertension   . PFO (patent foramen ovale) 07/19/2020   Noted on TEE 07/19/2020    Past Surgical History:  Procedure Laterality Date  . IR ANGIO INTRA EXTRACRAN SEL COM CAROTID INNOMINATE BILAT MOD SED  07/17/2020  . IR ANGIO VERTEBRAL SEL VERTEBRAL BILAT MOD SED  07/17/2020  . IR CT HEAD LTD  07/17/2020  . RADIOLOGY WITH ANESTHESIA N/A 07/16/2020   Procedure: IR WITH ANESTHESIA;  Surgeon: Julieanne Cotton, MD;  Location: [facility] OR;  Service: Radiology;  Laterality: N/A;  . TEE WITHOUT CARDIOVERSION N/A 07/19/2020   Procedure: TRANSESOPHAGEAL ECHOCARDIOGRAM (TEE);  Surgeon: Chilton Si, MD;  Location: [facility] ENDOSCOPY;  Service: Cardiovascular;  Laterality: N/A;    There were no vitals filed for this visit.   Subjective Assessment - 09/13/20 1107    Subjective "Um - no." (pt, re: if she has had talking situations that have been difficult for her)    Currently in Pain? No/denies                 ADULT SLP TREATMENT - 09/13/20 1110      General Information   Behavior/Cognition Alert;Cooperative;Pleasant mood      Treatment Provided   Treatment provided Cognitive-Linquistic      Cognitive-Linquistic Treatment   Treatment focused on Cognition;Aphasia    Skilled  Treatment (speech tx-individual) Pt with conversational fillers in explanations of mod complex topic [facility]) and in mod complex-complex conversation re: how her speech is different not compared to prior to ST. Both conversations were functional given pt's extra time. Pt stated she used compensatory strategies for anomia during those conversations, especially how her speech has changed and about the symphony. Tanya Lopez stated one reason her speech was better was due to it being easier to focus. (Cognition 10 minutes) SLP introduced the Cognitive Linguistic Quick Test to pt today, given she has mentioned attention/concentration in previous sessions.       Assessment / Recommendations / Plan   Plan Continue with current plan of care      Progression Toward Goals   Progression toward goals Progressing toward goals              SLP Short Term Goals - 09/13/20 1235      SLP SHORT TERM GOAL #1   Title Pt will name 10 items in personally relevant categorie swith rare min A    Baseline 09-08-20    Time 1    Period Weeks    Status On-going      SLP SHORT TERM GOAL #2   Title Pt will utilize compensations for anomia in structured naming tasks with rare min A over 2 sessions    Time 1  Period Weeks    Status On-going      SLP SHORT TERM GOAL #3   Title Pt will ID and correct written errors at sentence level with rare min A over 2 sessions    Time 1    Period Weeks    Status On-going            SLP Long Term Goals - 09/13/20 1235      SLP LONG TERM GOAL #1   Title Pt will utilize compensations for aphasia as needed in 20 minute mildly complex conversation with mod I over 2 sessions    Time 5    Period Weeks    Status On-going      SLP LONG TERM GOAL #2   Title Pt will write or email 2-3 paragraph message self correcting errors with rare min A over 2 sessions    Time 5    Period Weeks    Status On-going      SLP LONG TERM GOAL #3   Title Pt will complete complex  naming tasks with 90% accuracy over 2 sessions rare min A    Time 5    Period Weeks    Status On-going            Plan - 09/13/20 1230    Clinical Impression Statement Anomic aphasia persists. Tanya Lopez is also noting some high level cognitive changes in attention and processing. SLP began the cognitive-linguistic quick test (CLQT) today, to be completed next session. She benefits from encouragement to broaden her communication circle to improve confidence in conversation. Ongoing trainingn for compensations for cognition and aphasia, as well as maximize communication/word finding to eventual return to profession and QOL.    Speech Therapy Frequency 2x / week    Duration --   17 visits or 8 weeks   Treatment/Interventions Language facilitation;Environmental controls;Cueing hierarchy;SLP instruction and feedback;Compensatory strategies;Functional tasks;Compensatory techniques;Patient/family education;Multimodal communcation approach;Internal/external aids    Potential to Achieve Goals Good           Patient will benefit from skilled therapeutic intervention in order to improve the following deficits and impairments:   Aphasia  Cognitive communication deficit    Problem List Patient Active Problem List   Diagnosis Date Noted  . SAH (subarachnoid hemorrhage) (HCC) - L frontal parafalcine, present on admission w/ infarcts 07/20/2020  . 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 Essential hypertension 07/20/2020  . Hyperlipidemia 07/20/2020  . Urinary retention 07/20/2020  . Obesity 07/20/2020  . Hypokalemia 07/20/2020  . Hyperglycemia   . PFO (patent foramen ovale) 07/19/2020  . Acute L ACA, R cerebellar, L temporoparietal infarcts (HCC)  07/17/2020  . Neurologic abnormality   . Stroke (cerebrum) (HCC) 07/16/2020    [facility] ,MS, CCC-SLP  09/13/2020, 12:36 PM  [facility] [address]  [address] [place], [place], [address] Phone: [phone]   Fax:  [phone]   Name: Tanya Lopez MRN: [id number] Date of Birth: 1971/07/11

## 2020-09-13 NOTE — Therapy (Signed)
[facility] [address] [place], [place], [address] Phone: [phone]   Fax:  [phone]  Physical Therapy Treatment  Patient Details  Name: Tanya Lopez MRN: [id number] Date of Birth: [birth year] Referring Provider (PT): Mariam Dollar, PA-C. Followed up by: Faith Rogue, MD   Encounter Date: 09/13/2020   PT End of Session - 09/13/20 1148    Visit Number 8    Number of Visits 17    Date for PT Re-Evaluation 11/10/20   POC for 8 weeks, Cert for 90 days   Authorization Type BCBS (30 visit limit between OT/PT)    PT Start Time 1148   finishing up with Speech Therapy   PT Stop Time 1229    PT Time Calculation (min) 41 min    Equipment Utilized During Treatment Gait belt    Activity Tolerance Patient tolerated treatment well    Behavior During Therapy WFL for tasks assessed/performed;Flat affect           Past Medical History:  Diagnosis Date   Hypertension    PFO (patent foramen ovale) 07/19/2020   Noted on TEE 07/19/2020    Past Surgical History:  Procedure Laterality Date   IR ANGIO INTRA EXTRACRAN SEL COM CAROTID INNOMINATE BILAT MOD SED  07/17/2020   IR ANGIO VERTEBRAL SEL VERTEBRAL BILAT MOD SED  07/17/2020   IR CT HEAD LTD  07/17/2020   RADIOLOGY WITH ANESTHESIA N/A 07/16/2020   Procedure: IR WITH ANESTHESIA;  Surgeon: Julieanne Cotton, MD;  Location: [facility] OR;  Service: Radiology;  Laterality: N/A;   TEE WITHOUT CARDIOVERSION N/A 07/19/2020   Procedure: TRANSESOPHAGEAL ECHOCARDIOGRAM (TEE);  Surgeon: Chilton Si, MD;  Location: [facility] ENDOSCOPY;  Service: Cardiovascular;  Laterality: N/A;    There were no vitals filed for this visit.   Subjective Assessment - 09/13/20 1152    Subjective No new changes/complaint since last visit. No falls or no pain to report. Reports that exercises are going well.    Patient is accompained by: Family member   spouse in lobby   Pertinent History PMH:  Hypertension, Patient Foramen Ovale    Limitations Walking    Patient Stated Goals To be able to drive; to be able to talk    Currently in Pain? No/denies                             [facility] Adult PT Treatment/Exercise - 09/13/20 0001      Transfers   Transfers Sit to Stand;Stand to Sit    Sit to Stand 5: Supervision;Without upper extremity assist;From bed    Stand to Sit 5: Supervision;Without upper extremity assist;To bed    Comments completed sit <> stand training 1 x 15 reps with BLE placed on airex pad, completed without UE support. no instances on imbalance.       Ambulation/Gait   Ambulation/Gait Yes    Ambulation/Gait Assistance 5: Supervision    Ambulation/Gait Assistance Details throughout therapy session with activities    Assistive device None    Gait Pattern Step-through pattern;Decreased arm swing - right    Ambulation Surface Level;Indoor      High Level Balance   High Level Balance Activities Head turns    High Level Balance Comments Completed ambulation with horizontal/vertical head turns 1 x 115 ft each, no difficulty noted with either , supervision level. Completed negotiating over obtacles (hurdles) x 2 laps down and back without cognitive challenge, progressed  to completing negoitating over obstalces with cognitive task (including counting down by 3's, or naming fruits) increased difficulty noted iwth addition of cogntive challenge.       Neuro Re-ed    Neuro Re-ed Details  Completed balance exercises on BOSU in // bars; completed alternating step up with opposite LE knee drive, x 15 reps with single UE support. Completed standing on BOSU with flat side up with eyes open x 2 minutes, and progresed to completing with eyes closed 4 x 30 seconds each. Increased sway noted EC. Completed horizontal/vertical head turns 1 x 15 reps each with flat side up. Increased sway with horizontal head turns. Standing across blue balance beam, completed alternating toe  taps to pebbles x 10 reps without UE support, followed by progressing to crossover toe taps x 10 reps. Completed ambulation with self ball toss x 2 laps, down and back slowed cadence noted, progressed to completing self toss with dual tasking x 2 laps down and back, including counting down by 4's and naming vegetables. CGA required and increased time required with dual task due to slowed gait speed.       Exercises   Exercises Ankle      Ankle Exercises: Standing   Heel Raises Both;10 reps;3 seconds    Heel Raises Limitations completed in // bars with light UE support    Toe Raise 10 reps;3 seconds    Toe Raise Limitations completed in // bars with light UE support. PT providing verbal cues for improved form with completion.                     PT Short Term Goals - 08/15/20 1202      PT SHORT TERM GOAL #1   Title Patient will be independent with Initial HEP for strenghtening/balance (All STG due: 09/09/2020)    Baseline no HEP established    Time 4    Period Weeks    Status New    Target Date 09/09/20      PT SHORT TERM GOAL #2   Title Patient will demo ability to ambulate >500 ft on indoor surfaces with LRAD and Supervision    Baseline 100, Min Guard    Time 4    Period Weeks    Status New      PT SHORT TERM GOAL #3   Title Patient will undergo further Balance Assessment of BERG and LTG set    Baseline 44/56 on 8/23    Time 4    Period Weeks    Status Achieved    Target Date 09/09/20             PT Long Term Goals - 09/01/20 1853      PT LONG TERM GOAL #1   Title Patient will be independent with Final HEP (all LTGs due: 10/07/2020)    Baseline no HEP established    Time 8    Period Weeks    Status New      PT LONG TERM GOAL #2   Title Patient will improve Berg Balance by 5 points from baseline to demonstrate improved balance and reduced fall risk    Baseline 44/56 on 8/23    Time 8    Period Weeks    Status New      PT LONG TERM GOAL #3   Title  Patient will demonsrate ability to ascend/descend stairs 4 stairs at supervision level without UE support    Baseline ascend/descend with RW, Min  Guard    Time 8    Period Weeks    Status New      PT LONG TERM GOAL #4   Title Patient will improve TUG <12 seconds with LRAD to demo reduced fall risk    Baseline 18.56 secs    Time 8    Period Weeks    Status New      PT LONG TERM GOAL #5   Title Patient will improve 5x sit<> stand to <12 seconds with UE support to demonstrate improved functional strength and mobility    Baseline 15.97 secs    Time 8    Period Weeks    Status New      Additional Long Term Goals   Additional Long Term Goals Yes      PT LONG TERM GOAL #6   Title Patient will improve gait speed to 2.5 ft/sec to demonstrate improved community ambulation    Baseline 1.90 ft/sec    Time 8    Period Weeks    Status New      PT LONG TERM GOAL #7   Title Pt will incr FGA score to at least a 24/30 in order to demo decr fall risk.    Baseline 18/30 on 09/01/20    Time 8    Period Weeks    Status New                 Plan - 09/13/20 1232    Clinical Impression Statement Today's skilled PT session included continued balance activites with addition of dual task, increased difficulty noted with dual tasking. Patient require CGA at times but overall demonstrate improved stability. Will continue to progress toward LTGs.    Personal Factors and Comorbidities Comorbidity 2    Comorbidities HTN, Patent Foramen Ovale    Examination-Activity Limitations Stairs;Transfers;Stand;Bed Mobility    Examination-Participation Restrictions Community Activity;Driving;Yard Work    Conservation officer, historic buildingstability/Clinical Decision Making Evolving/Moderate complexity    Rehab Potential Good    PT Frequency 2x / week    PT Duration 8 weeks    PT Treatment/Interventions ADLs/Self Care Home Management;Aquatic Therapy;Electrical Stimulation;Moist Heat;DME Instruction;Gait training;Stair training;Functional  mobility training;Therapeutic activities;Therapeutic exercise;Balance training;Neuromuscular re-education;Patient/family education;Orthotic Fit/Training;Manual techniques;Passive range of motion;Energy conservation    PT Next Visit Plan Update/Review Entire HEP. dynamic gait training with no AD and continue outdoors, high level balance on compliant surfaces, try some BOSU balance, anticipate early d/c due to pt's progress    Consulted and Agree with Plan of Care Patient;Family member/caregiver    Family Member Consulted Husband           Patient will benefit from skilled therapeutic intervention in order to improve the following deficits and impairments:  Abnormal gait, Decreased balance, Decreased endurance, Decreased mobility, Difficulty walking, Decreased range of motion, Decreased activity tolerance, Decreased knowledge of use of DME, Decreased coordination, Decreased strength  Visit Diagnosis: Hemiplegia and hemiparesis following cerebral infarction affecting right dominant side (HCC)  Unsteadiness on feet  Other abnormalities of gait and mobility  Foot drop, right  Muscle weakness (generalized)     Problem List Patient Active Problem List   Diagnosis Date Noted   SAH (subarachnoid hemorrhage) (HCC) - L frontal parafalcine, present on admission w/ infarcts 07/20/2020   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 [place], [place], [address] Phone: [phone]   Fax:   [phone]  Name: Tanya Lopez MRN: [id number] Date of Birth: [birth year]

## 2020-09-15 ENCOUNTER — Ambulatory Visit: Payer: BC Managed Care – PPO | Admitting: Occupational Therapy

## 2020-09-15 ENCOUNTER — Encounter: Payer: Self-pay | Admitting: Occupational Therapy

## 2020-09-15 ENCOUNTER — Ambulatory Visit: Payer: BC Managed Care – PPO

## 2020-09-15 ENCOUNTER — Other Ambulatory Visit: Payer: Self-pay

## 2020-09-15 DIAGNOSIS — I69351 Hemiplegia and hemiparesis following cerebral infarction affecting right dominant side: Secondary | ICD-10-CM | POA: Diagnosis not present

## 2020-09-15 DIAGNOSIS — R2689 Other abnormalities of gait and mobility: Secondary | ICD-10-CM

## 2020-09-15 DIAGNOSIS — R4701 Aphasia: Secondary | ICD-10-CM

## 2020-09-15 DIAGNOSIS — R41841 Cognitive communication deficit: Secondary | ICD-10-CM

## 2020-09-15 DIAGNOSIS — M6281 Muscle weakness (generalized): Secondary | ICD-10-CM

## 2020-09-15 DIAGNOSIS — R2681 Unsteadiness on feet: Secondary | ICD-10-CM

## 2020-09-15 DIAGNOSIS — M21371 Foot drop, right foot: Secondary | ICD-10-CM

## 2020-09-15 NOTE — Patient Instructions (Addendum)
  Strengthening: Resisted Flexion   Hold tubing with _____ arm(s) at side. Pull forward and up. Move shoulder through pain-free range of motion. Repeat __10__ times per set.  Do _1-2_ sessions per day , every other day   Strengthening: Resisted Extension   Hold tubing in _____ hand(s), arm forward. Pull arm back, elbow straight. Repeat _10___ times per set. Do _1-2___ sessions per day, every other day.   Resisted Horizontal Abduction: Bilateral   Sit or stand, tubing in both hands, arms out in front. Keeping arms straight, pinch shoulder blades together and stretch arms out. Repeat _10___ times per set. Do _1-2___ sessions per day, every other day.   Elbow Flexion: Resisted   With tubing held in ______ hand(s) and other end secured under foot, curl arm up as far as possible. Repeat _10___ times per set. Do _1-2___ sessions per day, every other day.    Elbow Extension: Resisted   Sit in chair with resistive band secured at armrest (or hold with other hand) and _______ elbow bent. Straighten elbow. Repeat _10___ times per set.  Do _1-2___ sessions per day, every other day.   Copyright  VHI. All rights reserved.   1. Grip Strengthening (Resistive Putty)   Squeeze putty using thumb and all fingers. Repeat _20___ times. Do __2__ sessions per day.   2. Roll putty into tube on table and pinch between each finger and thumb x 10 reps each. (can do ring and small finger together)     Copyright  VHI. All rights reserved.

## 2020-09-15 NOTE — Therapy (Signed)
[facility] [address] [place], [place], [address] Phone: [phone]   Fax:  [phone]  Occupational Therapy Treatment  Patient Details  Name: Tanya Lopez MRN: [id number] Date of Birth: [birth year] Referring Provider (OT): Dr. Ivory Broad   Encounter Date: 09/15/2020   OT End of Session - 09/15/20 1022    Visit Number 2    Number of Visits 9    Date for OT Re-Evaluation 10/27/20    Authorization Type BCBS 2021, 30 visit limit combined OT/PT (each visit of each type counts), no auth    Authorization - Visit Number 2    Authorization - Number of Visits 15    OT Start Time 1019    OT Stop Time 1057    OT Time Calculation (min) 38 min    Activity Tolerance Patient tolerated treatment well    Behavior During Therapy [facility] for tasks assessed/performed;Flat affect           Past Medical History:  Diagnosis Date  . Hypertension   . PFO (patent foramen ovale) 07/19/2020   Noted on TEE 07/19/2020    Past Surgical History:  Procedure Laterality Date  . IR ANGIO INTRA EXTRACRAN SEL COM CAROTID INNOMINATE BILAT MOD SED  07/17/2020  . IR ANGIO VERTEBRAL SEL VERTEBRAL BILAT MOD SED  07/17/2020  . IR CT HEAD LTD  07/17/2020  . RADIOLOGY WITH ANESTHESIA N/A 07/16/2020   Procedure: IR WITH ANESTHESIA;  Surgeon: Julieanne Cotton, MD;  Location: [facility] OR;  Service: Radiology;  Laterality: N/A;  . TEE WITHOUT CARDIOVERSION N/A 07/19/2020   Procedure: TRANSESOPHAGEAL ECHOCARDIOGRAM (TEE);  Surgeon: Chilton Si, MD;  Location: [facility] ENDOSCOPY;  Service: Cardiovascular;  Laterality: N/A;    There were no vitals filed for this visit.   Subjective Assessment - 09/15/20 1020    Subjective  Pt reports she is feeling good and denies any pain. Pt reports cleaning dishes and household cleaning and cooking are still a challenge.    Patient is accompanied by: Family member   husband   Pertinent History Cerebral Infarction  07/16/20.  PMH:  Hypertension, Patent Foramen Ovale, HLD    Patient Stated Goals be able to drive, improve RUE strength    Currently in Pain? No/denies                        OT Treatments/Exercises (OP) - 09/15/20 1051      ADLs   Home Maintenance worked on cleaning in kitchen with mod I for reaching lighter objects of pots and pants to low cabinet.       Exercises   Exercises Hand;Theraputty      Cognitive Exercises   Other Cognitive Exercises 1 dual tasking with physical/cognitive - walking and word finding animals. minimal increased time      Hand Exercises   Other Hand Exercises resistance clothespins 1-8# no difficulty      Theraputty   Theraputty - Flatten see pt instructions      Functional Reaching Activities   Low Level --   reached to lower cabinets and removed and placed pots/pans   High Level --   overhead reach with cones - no difficulty                 OT Education - 09/15/20 1030    Education Details issued red theraputty exercises and yellow theraband exercises for RUE strengthening - see pt instructions    Person(s) Educated Patient  Methods Explanation    Comprehension Verbalized understanding            OT Short Term Goals - 09/15/20 1031      OT SHORT TERM GOAL #1   Title Pt will be independent with HEP for RUE hand strength--check STGs 10/12/20    Time 4    Period Weeks    Status On-going   issued putty 09/15/20     OT SHORT TERM GOAL #2   Title Pt will perform environmental scanning in busy, dynamic environment with 100% accuracy for incr safety with driving.    Time 4    Period Weeks    Status New      OT SHORT TERM GOAL #3   Title Pt will be verbalize understanding of memory/cognitive compensation strategies for ADLs/IADLs prn.    Time 4    Period Weeks    Status New      OT SHORT TERM GOAL #4   Title Pt will perform mod complex cooking including chopping/cutting mod I.    Time 8    Period Weeks    Status  New      OT SHORT TERM GOAL #5   Title Pt will perfom mod complex cleaning including reaching high and low mod I.    Time 8    Period Weeks    Status On-going   reached overhead with putting away cones no difficulty 09/15/20            OT Long Term Goals - 09/12/20 1536      OT LONG TERM GOAL #1   Title Pt will be independent with HEP for proximal RUE strength.--check LTGs 10/27/20    Time 6    Period Weeks    Status New      OT LONG TERM GOAL #2   Title Pt will be able to divide attention between at least 1 cognitive and 1 physical task with at least 95% accuracy in prep for driving.    Time 6    Period Weeks    Status New      OT LONG TERM GOAL #3   Title Pt/husband will verbalize understanding of graduated driving recommendations once cleared to drive by physician.    Time 6    Period Weeks    Status New      OT LONG TERM GOAL #4   Title Pt will improve R grip strength by at least 10lbs to assist with opening containers.    Baseline 36.5lbs    Time 6    Period Weeks    Status New                 Plan - 09/15/20 1036    Clinical Impression Statement Pt demonstrated some difficulty with processing speed with novel tasks. Increasing grip strength and RUE strength for functional tasks. Issued HEP    OT Occupational Profile and History Detailed Assessment- Review of Records and additional review of physical, cognitive, psychosocial history related to current functional performance    Occupational performance deficits (Please refer to evaluation for details): ADL's;IADL's;Leisure;Work    Games developer / Function / Physical Skills ADL;Balance;IADL;Strength;UE functional use;Decreased knowledge of precautions    Cognitive Skills Attention;Memory    Rehab Potential Good    Clinical Decision Making Several treatment options, min-mod task modification necessary    Comorbidities Affecting Occupational Performance: May have comorbidities impacting occupational  performance    Modification or Assistance to Complete Evaluation  No modification  of tasks or assist necessary to complete eval    OT Frequency 2x / week    OT Duration 6 weeks   +eval   OT Treatment/Interventions Self-care/ADL training;DME and/or AE instruction;Moist Heat;Balance training;Therapeutic activities;Therapeutic exercise;Cognitive remediation/compensation;Cryotherapy;Neuromuscular education;Functional Mobility Training;Passive range of motion;Patient/family education;Visual/perceptual remediation/compensation    Plan environmental scanning, divided attention    Consulted and Agree with Plan of Care Patient;Family member/caregiver    Family Member Consulted husband           Patient will benefit from skilled therapeutic intervention in order to improve the following deficits and impairments:   Body Structure / Function / Physical Skills: ADL, Balance, IADL, Strength, UE functional use, Decreased knowledge of precautions Cognitive Skills: Attention, Memory     Visit Diagnosis: Hemiplegia and hemiparesis following cerebral infarction affecting right dominant side (HCC)  Unsteadiness on feet  Other abnormalities of gait and mobility  Muscle weakness (generalized)    Problem List Patient Active Problem List   Diagnosis Date Noted  . SAH (subarachnoid hemorrhage) (HCC) - L frontal parafalcine, present on admission w/ infarcts 07/20/2020  . 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 Essential hypertension 07/20/2020  . Hyperlipidemia 07/20/2020  . Urinary retention 07/20/2020  . Obesity 07/20/2020  . Hypokalemia 07/20/2020  . Hyperglycemia   . PFO (patent foramen ovale) 07/19/2020  . Acute L ACA, R cerebellar, L temporoparietal infarcts (HCC)  07/17/2020  . Neurologic abnormality   . 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

## 2020-09-15 NOTE — Patient Instructions (Signed)
Access Code: [email or web address] URL: [email or web address] Date: 09/15/2020 Prepared by: Jethro Bastos  Exercises Sit to Stand - 1 x daily - 5 x weekly - 3 sets - 10 reps Side Stepping with Resistance at Thighs and Counter Support - 1 x daily - 5 x weekly - 3 sets - 10 reps Marching with Resistance at Countertop - 1 x daily - 5 x weekly - 2 sets - 10 reps Standing Hip Extension with Counter Support - 1 x daily - 5 x weekly - 2 sets - 10 reps Mini Squat with Counter Support - 1 x daily - 5 x weekly - 3 sets - 10 reps Standing Toe Raises with Support - 1 x daily - 5 x weekly - 3 sets - 10 reps Seated Ankle Dorsiflexion with Anchored Resistance - 1 x daily - 5 x weekly - 2 sets - 10 reps Tandem Stance with Head Rotation on Foam Pad - 1 x daily - 5 x weekly - 2 sets - 10 reps Romberg Stance Eyes Closed on Foam Pad - 1 x daily - 5 x weekly - 1 sets - 3 reps - 30 seconds hold Single Leg Stance on Foam Pad - 1 x daily - 5 x weekly - 1 sets - 3 reps - 15-20 seconds hold

## 2020-09-15 NOTE — Therapy (Signed)
[facility] [address] Tanya Lopez, [place], [address] Phone: [phone]   Fax:  [phone]  Speech Language Pathology Treatment  Patient Details  Name: Tanya Lopez MRN: [id number] Date of Birth: [birth year] Referring Provider (SLP): Dr. Alger Simons   Encounter Date: 09/15/2020   End of Session - 09/15/20 1302    Visit Number 7    Number of Visits 17    Date for SLP Re-Evaluation 10/10/20    SLP Start Time 1104    SLP Stop Time  1145    SLP Time Calculation (min) 41 min    Activity Tolerance Patient tolerated treatment well;Other (comment)   pt coughed 36x during session (anxiety?)          Past Medical History:  Diagnosis Date  . Hypertension   . PFO (patent foramen ovale) 07/19/2020   Noted on TEE 07/19/2020    Past Surgical History:  Procedure Laterality Date  . IR ANGIO INTRA EXTRACRAN SEL COM CAROTID INNOMINATE BILAT MOD SED  07/17/2020  . IR ANGIO VERTEBRAL SEL VERTEBRAL BILAT MOD SED  07/17/2020  . IR CT HEAD LTD  07/17/2020  . RADIOLOGY WITH ANESTHESIA N/A 07/16/2020   Procedure: IR WITH ANESTHESIA;  Surgeon: Luanne Bras, MD;  Location: [facility];  Service: Radiology;  Laterality: N/A;  . TEE WITHOUT CARDIOVERSION N/A 07/19/2020   Procedure: TRANSESOPHAGEAL ECHOCARDIOGRAM (TEE);  Surgeon: Skeet Latch, MD;  Location: [facility];  Service: Cardiovascular;  Laterality: N/A;    There were no vitals filed for this visit.   Subjective Assessment - 09/15/20 1119    Subjective "Fine." Pt coughing throughout session, did not cough in hall once during 5 minutes prior to ST.    Currently in Pain? No/denies                 ADULT SLP TREATMENT - 09/15/20 1258      General Information   Behavior/Cognition Alert;Cooperative;Pleasant mood      Treatment Provided   Treatment provided Cognitive-Linquistic      Cognitive-Linquistic Treatment   Treatment focused on Cognition;Aphasia      Skilled Treatment SLP completed Cognitive Linguitric Quick Test today and pt scored WNL in all domains. Clock drawing was moderately deficient, likely due to decr'd error awareness as well as decr'd attention to detail. Pt, in report to SLP told him she has more difficulty paying attention amidst distracting stimuli, more difficulty with details "especially when cooking and baking" such as leaving ingredients out. This suggests attention, attention to detail, adn awareness deficits Goals added as necessary.       Assessment / Recommendations / Plan   Plan Continue with current plan of care;Goals updated      Progression Toward Goals   Progression toward goals --   goals updated           SLP Education - 09/15/20 1301    Education Details deficit areas in cognition as attention, awareness, attention to detail    Person(s) Educated Patient    Methods Explanation    Comprehension Verbalized understanding            SLP Short Term Goals - 09/15/20 1303      SLP SHORT TERM GOAL #1   Title Pt will name 10 items in personally relevant categorie swith rare min A    Baseline 09-08-20    Time 1    Period Weeks    Status Partially Met      SLP SHORT  TERM GOAL #2   Title Pt will utilize compensations for anomia in structured naming tasks with rare min A over 2 sessions    Time 1    Period Weeks    Status Achieved      SLP SHORT TERM GOAL #3   Title Pt will ID and correct written errors at sentence level with rare min A over 2 sessions    Time 1    Period Weeks    Status Not Met            SLP Long Term Goals - 09/15/20 1304      SLP LONG TERM GOAL #1   Title Pt will utilize compensations for aphasia as needed in 20 minute mildly complex conversation with mod I over 2 sessions    Time 5    Period Weeks    Status On-going      SLP LONG TERM GOAL #2   Title Pt will write or email 2-3 paragraph message self correcting errors with rare min A over 2 sessions    Time 5     Period Weeks    Status On-going      SLP LONG TERM GOAL #3   Title Pt will complete complex naming tasks with 90% accuracy over 2 sessions rare min A    Time 5    Period Weeks    Status On-going      SLP LONG TERM GOAL #4   Title pt will demo 15 minutes selective attention to complete mod complex cognitive linguistic tasks, in mod noisy environment x2 sessions    Time 5    Period Weeks    Status New      SLP LONG TERM GOAL #5   Title pt will complete tasks with 100% accuracy by demonstrating error awareness (self correcting/double checking) x3 sessions    Time 5    Period Weeks    Status New            Plan - 09/15/20 1302    Clinical Impression Statement Mild anomic aphasia persists. Tanya Lopez is also noting some high level cognitive changes in attention and processing such as while baking or cooking with atteniton, awareness, adn attention to details. SLP completed cognitive-linguistic quick test (CLQT) today, results in "skilled intervention" Goals added as appropriate. She benefits from encouragement to broaden her communication circle to improve confidence in conversation. Ongoing trainingn for compensations for cognition and aphasia, as well as maximize communication/word finding to eventual return to profession and QOL.    Speech Therapy Frequency 2x / week    Duration --   17 visits or 8 weeks   Treatment/Interventions Language facilitation;Environmental controls;Cueing hierarchy;SLP instruction and feedback;Compensatory strategies;Functional tasks;Compensatory techniques;Patient/family education;Multimodal communcation approach;Internal/external aids    Potential to Achieve Goals Good           Patient will benefit from skilled therapeutic intervention in order to improve the following deficits and impairments:   Cognitive communication deficit  Aphasia    Problem List Patient Active Problem List   Diagnosis Date Noted  . SAH (subarachnoid hemorrhage) (HCC) - L  frontal parafalcine, present on admission w/ infarcts 07/20/2020  . 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 Essential hypertension 07/20/2020  . Hyperlipidemia 07/20/2020  . Urinary retention 07/20/2020  . Obesity 07/20/2020  . Hypokalemia 07/20/2020  . Hyperglycemia   . PFO (patent foramen ovale) 07/19/2020  . Acute L ACA, R cerebellar, L temporoparietal infarcts ([place])  07/17/2020  .  Neurologic abnormality   . 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

## 2020-09-15 NOTE — Therapy (Signed)
[facility] [address] [place], [place], [address] Phone: [phone]   Fax:  [phone]  Physical Therapy Treatment  Patient Details  Name: Tanya Lopez MRN: [id number] Date of Birth: 1971/04/03 Referring Provider (PT): Mariam Dollar, PA-C. Followed up by: Faith Rogue, MD   Encounter Date: 09/15/2020   PT End of Session - 09/15/20 1237    Visit Number 9    Number of Visits 17    Date for PT Re-Evaluation 11/10/20   POC for 8 weeks, Cert for 90 days   Authorization Type [employer] (30 visit limit between OT/PT)    PT Start Time 1232    PT Stop Time 1310    PT Time Calculation (min) 38 min    Equipment Utilized During Treatment Gait belt    Activity Tolerance Patient tolerated treatment well    Behavior During Therapy WFL for tasks assessed/performed;Flat affect           Past Medical History:  Diagnosis Date  . Hypertension   . PFO (patent foramen ovale) 07/19/2020   Noted on TEE 07/19/2020    Past Surgical History:  Procedure Laterality Date  . IR ANGIO INTRA EXTRACRAN SEL COM CAROTID INNOMINATE BILAT MOD SED  07/17/2020  . IR ANGIO VERTEBRAL SEL VERTEBRAL BILAT MOD SED  07/17/2020  . IR CT HEAD LTD  07/17/2020  . RADIOLOGY WITH ANESTHESIA N/A 07/16/2020   Procedure: IR WITH ANESTHESIA;  Surgeon: Julieanne Cotton, MD;  [facility];  Service: Radiology;  Laterality: N/A;  . TEE WITHOUT CARDIOVERSION N/A 07/19/2020   Procedure: TRANSESOPHAGEAL ECHOCARDIOGRAM (TEE);  Surgeon: Chilton Si, MD;  Location: [facility] END[facility];  Service: Cardiovascular;  Laterality: N/A;    There were no vitals filed for this visit.   Subjective Assessment - 09/15/20 1235    Subjective Patient reports no new changes since last visit. No falls or pain to report.    Patient is accompained by: Family member   spouse in lobby   Pertinent History PMH: Hypertension, Patient Foramen Ovale    Limitations Walking    Patient  Stated Goals To be able to drive; to be able to talk    Currently in Pain? No/denies                             [facility] Adult PT Treatment/Exercise - 09/15/20 1300      Ambulation/Gait   Ambulation/Gait Yes    Ambulation/Gait Assistance 6: Modified independent (Device/Increase time)    Ambulation/Gait Assistance Details throughout therapy session with activities    Assistive device None    Gait Pattern Step-through pattern;Decreased arm swing - right    Ambulation Surface Level;Indoor      Standardized Balance Assessment   Standardized Balance Assessment Berg Balance Test      Berg Balance Test   Sit to Stand Able to stand without using hands and stabilize independently    Standing Unsupported Able to stand safely 2 minutes    Sitting with Back Unsupported but Feet Supported on Floor or Stool Able to sit safely and securely 2 minutes    Stand to Sit Sits safely with minimal use of hands    Transfers Able to transfer safely, minor use of hands    Standing Unsupported with Eyes Closed Able to stand 10 seconds safely    Standing Ubsupported with Feet Together Able to place feet together independently and stand 1 minute safely  From Standing, Reach Forward with Outstretched Arm Can reach confidently >25 cm (10")    From Standing Position, Pick up Object from Floor Able to pick up shoe safely and easily    From Standing Position, Turn to Look Behind Over each Shoulder Looks behind from both sides and weight shifts well    Turn 360 Degrees Able to turn 360 degrees safely in 4 seconds or less    Standing Unsupported, Alternately Place Feet on Step/Stool Able to stand independently and safely and complete 8 steps in 20 seconds    Standing Unsupported, One Foot in Front Able to place foot tandem independently and hold 30 seconds    Standing on One Leg Able to lift leg independently and hold > 10 seconds    Total Score 56      Exercises   Exercises Other Exercises     Other Exercises  Completed entire review of current HEP and progressed as tolerated by patient, see medbridge program or patient instructions for details.            Completed all of the following exercises as progression of HEP. Educated on proper completion, and splitting up exercises into strength vs. Balance to allow for compliance upon discharge. PT educating to complete all balance exercises in a corner in room for safety. Minimal verbal cues required, patient able to complete with proper form.     Access Code: [email or web address] URL: [email or web address] Date: 09/15/2020 Prepared by: Jethro BastosKaitlyn Harsimran Westman  Exercises Sit to Stand - 1 x daily - 5 x weekly - 3 sets - 10 reps Side Stepping with Resistance at Thighs and Counter Support - 1 x daily - 5 x weekly - 3 sets - 10 reps Marching with Resistance at Countertop - 1 x daily - 5 x weekly - 2 sets - 10 reps Standing Hip Extension with Counter Support - 1 x daily - 5 x weekly - 2 sets - 10 reps Mini Squat with Counter Support - 1 x daily - 5 x weekly - 3 sets - 10 reps Standing Toe Raises with Support - 1 x daily - 5 x weekly - 3 sets - 10 reps Seated Ankle Dorsiflexion with Anchored Resistance - 1 x daily - 5 x weekly - 2 sets - 10 reps Tandem Stance with Head Rotation on Foam Pad - 1 x daily - 5 x weekly - 2 sets - 10 reps Romberg Stance Eyes Closed on Foam Pad - 1 x daily - 5 x weekly - 1 sets - 3 reps - 30 seconds hold Single Leg Stance on Foam Pad - 1 x daily - 5 x weekly - 1 sets - 3 reps - 15-20 seconds hold       PT Education - 09/15/20 1256    Education Details Educated on HEP progression/update    Person(s) Educated Patient    Methods Explanation;Demonstration;Handout    Comprehension Verbalized understanding;Returned demonstration            PT Short Term Goals - 08/15/20 1202      PT SHORT TERM GOAL #1   Title Patient will be independent with Initial HEP for strenghtening/balance (All STG due: 09/09/2020)     Baseline no HEP established    Time 4    Period Weeks    Status New    Target Date 09/09/20      PT SHORT TERM GOAL #2   Title Patient will demo ability to ambulate >500 ft on indoor surfaces with LRAD  and Supervision    Baseline 100, Min Guard    Time 4    Period Weeks    Status New      PT SHORT TERM GOAL #3   Title Patient will undergo further Balance Assessment of BERG and LTG set    Baseline 44/56 on 8/23    Time 4    Period Weeks    Status Achieved    Target Date 09/09/20             PT Long Term Goals - 09/15/20 1308      PT LONG TERM GOAL #1   Title Patient will be independent with Final HEP (all LTGs due: 10/07/2020)    Baseline no HEP established    Time 8    Period Weeks    Status New      PT LONG TERM GOAL #2   Title Patient will improve Berg Balance by 5 points from baseline to demonstrate improved balance and reduced fall risk    Baseline 44/56 on 8/23, 56/56 on 9/23    Time 8    Period Weeks    Status Achieved      PT LONG TERM GOAL #3   Title Patient will demonsrate ability to ascend/descend stairs 4 stairs at supervision level without UE support    Baseline ascend/descend with RW, Min Guard    Time 8    Period Weeks    Status New      PT LONG TERM GOAL #4   Title Patient will improve TUG <12 seconds with LRAD to demo reduced fall risk    Baseline 18.56 secs    Time 8    Period Weeks    Status New      PT LONG TERM GOAL #5   Title Patient will improve 5x sit<> stand to <12 seconds with UE support to demonstrate improved functional strength and mobility    Baseline 15.97 secs    Time 8    Period Weeks    Status New      PT LONG TERM GOAL #6   Title Patient will improve gait speed to 2.5 ft/sec to demonstrate improved community ambulation    Baseline 1.90 ft/sec    Time 8    Period Weeks    Status New      PT LONG TERM GOAL #7   Title Pt will incr FGA score to at least a 24/30 in order to demo decr fall risk.    Baseline 18/30 on  09/01/20    Time 8    Period Weeks    Status New                 Plan - 09/15/20 1311    Clinical Impression Statement Today's skilled PT session included complete review and progression of current HEP to allow for progression and maintaining gains achieved with PT services. Began assessment of LTG, with patient able to meet LTG #2 today with Berg Balance Score of 56/56. Patient has made significant progress with PT services and is expected discharge at next session.    Personal Factors and Comorbidities Comorbidity 2    Comorbidities HTN, Patent Foramen Ovale    Examination-Activity Limitations Stairs;Transfers;Stand;Bed Mobility    Examination-Participation Restrictions Community Activity;Driving;Yard Work    Conservation officer, historic buildings Evolving/Moderate complexity    Rehab Potential Good    PT Frequency 2x / week    PT Duration 8 weeks    PT Treatment/Interventions ADLs/Self Care Home  Management;Aquatic Therapy;Electrical Stimulation;Moist Heat;DME Instruction;Gait training;Stair training;Functional mobility training;Therapeutic activities;Therapeutic exercise;Balance training;Neuromuscular re-education;Patient/family education;Orthotic Fit/Training;Manual techniques;Passive range of motion;Energy conservation    PT Next Visit Plan Finish Check LTG    Consulted and Agree with Plan of Care Patient;Family member/caregiver    Family Member Consulted Husband           Patient will benefit from skilled therapeutic intervention in order to improve the following deficits and impairments:  Abnormal gait, Decreased balance, Decreased endurance, Decreased mobility, Difficulty walking, Decreased range of motion, Decreased activity tolerance, Decreased knowledge of use of DME, Decreased coordination, Decreased strength  Visit Diagnosis: Hemiplegia and hemiparesis following cerebral infarction affecting right dominant side (HCC)  Unsteadiness on feet  Other abnormalities of gait  and mobility  Foot drop, right  Muscle weakness (generalized)     Problem List Patient Active Problem List   Diagnosis Date Noted  . SAH (subarachnoid hemorrhage) (HCC) - L frontal parafalcine, present on admission w/ infarcts 07/20/2020  . 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 Essential hypertension 07/20/2020  . Hyperlipidemia 07/20/2020  . Urinary retention 07/20/2020  . Obesity 07/20/2020  . Hypokalemia 07/20/2020  . Hyperglycemia   . PFO (patent foramen ovale) 07/19/2020  . Acute L ACA, R cerebellar, L temporoparietal infarcts (HCC)  07/17/2020  . Neurologic abnormality   . Stroke (cerebrum) (HCC) 07/16/2020    Tempie Donning, PT, DPT 09/15/2020, 1:15 PM  [facility] [address] [place], [place], [address] Phone: [phone]   Fax:  [phone]  Name: Tanya Lopez MRN: [id number] Date of Birth: 02-12-1971

## 2020-09-19 ENCOUNTER — Ambulatory Visit: Payer: BC Managed Care – PPO | Admitting: Occupational Therapy

## 2020-09-19 ENCOUNTER — Other Ambulatory Visit: Payer: Self-pay

## 2020-09-19 ENCOUNTER — Ambulatory Visit: Payer: BC Managed Care – PPO

## 2020-09-19 ENCOUNTER — Encounter: Payer: Self-pay | Admitting: Occupational Therapy

## 2020-09-19 DIAGNOSIS — I69318 Other symptoms and signs involving cognitive functions following cerebral infarction: Secondary | ICD-10-CM

## 2020-09-19 DIAGNOSIS — I69351 Hemiplegia and hemiparesis following cerebral infarction affecting right dominant side: Secondary | ICD-10-CM

## 2020-09-19 DIAGNOSIS — R4701 Aphasia: Secondary | ICD-10-CM

## 2020-09-19 DIAGNOSIS — M6281 Muscle weakness (generalized): Secondary | ICD-10-CM

## 2020-09-19 DIAGNOSIS — R41841 Cognitive communication deficit: Secondary | ICD-10-CM

## 2020-09-19 DIAGNOSIS — M21371 Foot drop, right foot: Secondary | ICD-10-CM

## 2020-09-19 DIAGNOSIS — R2681 Unsteadiness on feet: Secondary | ICD-10-CM

## 2020-09-19 DIAGNOSIS — R2689 Other abnormalities of gait and mobility: Secondary | ICD-10-CM

## 2020-09-19 NOTE — Therapy (Signed)
[facility] [address] [place], [place], [address] Phone: [phone]   Fax:  [phone]  Speech Language Pathology Treatment  Patient Details  Name: Tanya Lopez MRN: [id number] Date of Birth: April 08, 1971 Referring Provider (SLP): Dr. Alger Simons   Encounter Date: 09/19/2020   End of Session - 09/19/20 1037    Visit Number 8    Number of Visits 17    Date for SLP Re-Evaluation 10/10/20    SLP Start Time 0934    SLP Stop Time  3832    SLP Time Calculation (min) 41 min    Activity Tolerance Patient tolerated treatment well;Other (comment)   pt coughed 36x during session (anxiety?)          Past Medical History:  Diagnosis Date  . Hypertension   . PFO (patent foramen ovale) 07/19/2020   Noted on TEE 07/19/2020    Past Surgical History:  Procedure Laterality Date  . IR ANGIO INTRA EXTRACRAN SEL COM CAROTID INNOMINATE BILAT MOD SED  07/17/2020  . IR ANGIO VERTEBRAL SEL VERTEBRAL BILAT MOD SED  07/17/2020  . IR CT HEAD LTD  07/17/2020  . RADIOLOGY WITH ANESTHESIA N/A 07/16/2020   Procedure: IR WITH ANESTHESIA;  Surgeon: Luanne Bras, MD;  Location: [facility];  Service: Radiology;  Laterality: N/A;  . TEE WITHOUT CARDIOVERSION N/A 07/19/2020   Procedure: TRANSESOPHAGEAL ECHOCARDIOGRAM (TEE);  Surgeon: Skeet Latch, MD;  Location: [facility];  Service: Cardiovascular;  Laterality: N/A;    There were no vitals filed for this visit.   Subjective Assessment - 09/19/20 0942    Subjective Pt with some mild anomia but used circumlocution or synonym strategies.    Currently in Pain? No/denies                 ADULT SLP TREATMENT - 09/19/20 0945      General Information   Behavior/Cognition Alert;Cooperative;Pleasant mood      Treatment Provided   Treatment provided Cognitive-Linquistic      Cognitive-Linquistic Treatment   Treatment focused on Cognition;Aphasia    Skilled Treatment Pt  with minor word finding episodes today - used compensations successfully with rare discomfort from listener re: time it took for pt to generate utterance. She states she is planning meals and starting them but unable to complete due to stamina. No/limited diffculty with sequencing and planning meal choice. A tofu meal for lunch yesterday, and last night chicken and bamboo shoots. Pt began and had jambalaya Saturday night with husband. She used MyChart x2 during sessino to check on appointments for this week, successfully. She will find a date to pay bills with husband watching an put in her calendar, adn will edit 3 photos today and tomorrow and notate any difficulties with this to share with SLP next session ([place]).        Assessment / Recommendations / Plan   Plan Continue with current plan of care;Goals updated      Progression Toward Goals   Progression toward goals Progressing toward goals              SLP Short Term Goals - 09/15/20 1303      SLP SHORT TERM GOAL #1   Title Pt will name 10 items in personally relevant categorie swith rare min A    Baseline 09-08-20    Time 1    Period Weeks    Status Partially Met      SLP SHORT TERM GOAL #2   Title  Pt will utilize compensations for anomia in structured naming tasks with rare min A over 2 sessions    Time 1    Period Weeks    Status Achieved      SLP SHORT TERM GOAL #3   Title Pt will ID and correct written errors at sentence level with rare min A over 2 sessions    Time 1    Period Weeks    Status Not Met            SLP Long Term Goals - 09/19/20 1041      SLP LONG TERM GOAL #1   Title Pt will utilize compensations for aphasia as needed in 20 minute mildly complex conversation with mod I over 2 sessions    Baseline 09-19-20    Time 4    Period Weeks    Status On-going      SLP LONG TERM GOAL #2   Title Pt will write or email 2-3 paragraph message self correcting errors with rare min A over 2 sessions    Time 4     Period Weeks    Status On-going      SLP LONG TERM GOAL #3   Title Pt will complete complex naming tasks with 90% accuracy over 2 sessions rare min A    Time 4    Period Weeks    Status On-going      SLP LONG TERM GOAL #4   Title pt will demo 15 minutes selective attention to complete mod complex cognitive linguistic tasks, in mod noisy environment x2 sessions    Time 4    Period Weeks    Status New      SLP LONG TERM GOAL #5   Title pt will complete tasks with 100% accuracy by demonstrating error awareness (self correcting/double checking) x3 sessions    Time 4    Period Weeks    Status On-going            Plan - 09/19/20 1038    Clinical Impression Statement Mild anomic aphasia persists. [name] notes that she is not paying bills like she did prior to CVA and SLP asked her to set a date to pay bills with husband (who is paying them now) and put in on her calendar which she states she uses successfully.  Pt used language compensations with occasional extra time (rarely distracting for listener). Ongoing trainingn for compensations for cognition and aphasia, as well as maximize communication/word finding to eventual return to profession and QOL.    Speech Therapy Frequency 2x / week    Duration --   17 visits or 8 weeks   Treatment/Interventions Language facilitation;Environmental controls;Cueing hierarchy;SLP instruction and feedback;Compensatory strategies;Functional tasks;Compensatory techniques;Patient/family education;Multimodal communcation approach;Internal/external aids    Potential to Achieve Goals Good           Patient will benefit from skilled therapeutic intervention in order to improve the following deficits and impairments:   Cognitive communication deficit  Aphasia    Problem List Patient Active Problem List   Diagnosis Date Noted  . SAH (subarachnoid hemorrhage) (HCC) - L frontal parafalcine, present on admission w/ infarcts 07/20/2020  . Possible  Dissection of L Anterior cerebral artery ([place]) 07/20/2020  . Essential hypertension 07/20/2020  . Hyperlipidemia 07/20/2020  . Urinary retention 07/20/2020  . Obesity 07/20/2020  . Hypokalemia 07/20/2020  . Hyperglycemia   . PFO (patent foramen ovale) 07/19/2020  . Acute L ACA, R cerebellar, L temporoparietal infarcts ([place])  07/17/2020  . Neurologic abnormality   . Stroke (cerebrum) ([place]) 07/16/2020    [facility] ,[place], [place]  09/19/2020, 10:47 AM  [facility] [address] [place], [place], [address] Phone: [phone]   Fax:  [phone]   Name: Tanya Lopez MRN: [id number] Date of Birth: 1971/06/25

## 2020-09-19 NOTE — Patient Instructions (Signed)
   FOR NEXT SESSION:  Choose a date and time with your husband to pay bills - he will watch to verify you're getting all the details. Put this appointment into your calendar.  Edit 3 photos today and Tuesday. Write down any trouble with procedures, or anything different that occurred with this to share with Vernona Rieger on Wednesday.

## 2020-09-19 NOTE — Patient Instructions (Signed)

## 2020-09-19 NOTE — Therapy (Signed)
[facility] [address] [place], [place], [address] Phone: [phone]   Fax:  [phone]  Occupational Therapy Treatment  Patient Details  Name: Tanya Lopez MRN: [id number] Date of Birth: [birth year] Referring Provider (OT): Dr. Ivory Broad   Encounter Date: 09/19/2020   OT End of Session - 09/19/20 1020    Visit Number 3    Number of Visits 9    Date for OT Re-Evaluation 10/27/20    Authorization Type BCBS 2021, 30 visit limit combined OT/PT (each visit of each type counts), no auth    Authorization - Visit Number 3    Authorization - Number of Visits 15    OT Start Time 1019    OT Stop Time 1100    OT Time Calculation (min) 41 min    Activity Tolerance Patient tolerated treatment well    Behavior During Therapy [facility] for tasks assessed/performed;Flat affect           Past Medical History:  Diagnosis Date  . Hypertension   . PFO (patent foramen ovale) 07/19/2020   Noted on TEE 07/19/2020    Past Surgical History:  Procedure Laterality Date  . IR ANGIO INTRA EXTRACRAN SEL COM CAROTID INNOMINATE BILAT MOD SED  07/17/2020  . IR ANGIO VERTEBRAL SEL VERTEBRAL BILAT MOD SED  07/17/2020  . IR CT HEAD LTD  07/17/2020  . RADIOLOGY WITH ANESTHESIA N/A 07/16/2020   Procedure: IR WITH ANESTHESIA;  Surgeon: Julieanne Cotton, MD;  Location: [facility] OR;  Service: Radiology;  Laterality: N/A;  . TEE WITHOUT CARDIOVERSION N/A 07/19/2020   Procedure: TRANSESOPHAGEAL ECHOCARDIOGRAM (TEE);  Surgeon: Chilton Si, MD;  Location[facility] ENDOSCOPY;  Service: Cardiovascular;  Laterality: N/A;    There were no vitals filed for this visit.   Subjective Assessment - 09/19/20 1020    Subjective  Pt denies any pain. Pt discharged from PT today.    Patient is accompanied by: Family member   husband   Pertinent History Cerebral Infarction 07/16/20.  PMH:  Hypertension, Patent Foramen Ovale, HLD    Patient Stated Goals be able to  drive, improve RUE strength    Currently in Pain? No/denies                        OT Treatments/Exercises (OP) - 09/19/20 1022      Cognitive Exercises   Sequencing Skills sequence cards 8 step - no difficulty    Attention Span Alternating environmental scanning with pathfinding outdoors - subtraction tasks while walking with 90% accuracy.    Attention Span Divided sequencing cards while environemntal scanning - no difficulty with 6 numbers in moderately distracting environmnt    Other Cognitive Exercises 1 following recipe - min assistance with verbal cues for organizing and sequencing recipe    Other Cognitive Exercises 2 memory strategies - see pt instructions      Hand Exercises   Other Hand Exercises hand gripper level 3 with 1 inch blocks. min drops                    OT Short Term Goals - 09/15/20 1031      OT SHORT TERM GOAL #1   Title Pt will be independent with HEP for RUE hand strength--check STGs 10/12/20    Time 4    Period Weeks    Status On-going   issued putty 09/15/20     OT SHORT TERM GOAL #2   Title Pt  will perform environmental scanning in busy, dynamic environment with 100% accuracy for incr safety with driving.    Time 4    Period Weeks    Status New      OT SHORT TERM GOAL #3   Title Pt will be verbalize understanding of memory/cognitive compensation strategies for ADLs/IADLs prn.    Time 4    Period Weeks    Status New      OT SHORT TERM GOAL #4   Title Pt will perform mod complex cooking including chopping/cutting mod I.    Time 8    Period Weeks    Status New      OT SHORT TERM GOAL #5   Title Pt will perfom mod complex cleaning including reaching high and low mod I.    Time 8    Period Weeks    Status On-going   reached overhead with putting away cones no difficulty 09/15/20            OT Long Term Goals - 09/12/20 1536      OT LONG TERM GOAL #1   Title Pt will be independent with HEP for proximal RUE  strength.--check LTGs 10/27/20    Time 6    Period Weeks    Status New      OT LONG TERM GOAL #2   Title Pt will be able to divide attention between at least 1 cognitive and 1 physical task with at least 95% accuracy in prep for driving.    Time 6    Period Weeks    Status New      OT LONG TERM GOAL #3   Title Pt/husband will verbalize understanding of graduated driving recommendations once cleared to drive by physician.    Time 6    Period Weeks    Status New      OT LONG TERM GOAL #4   Title Pt will improve R grip strength by at least 10lbs to assist with opening containers.    Baseline 36.5lbs    Time 6    Period Weeks    Status New                 Plan - 09/19/20 1035    Clinical Impression Statement Pt is demontrating increased RUE hand strength this day and cognitive development.    OT Occupational Profile and History Detailed Assessment- Review of Records and additional review of physical, cognitive, psychosocial history related to current functional performance    Occupational performance deficits (Please refer to evaluation for details): ADL's;IADL's;Leisure;Work    Games developer / Function / Physical Skills ADL;Balance;IADL;Strength;UE functional use;Decreased knowledge of precautions    Cognitive Skills Attention;Memory    Rehab Potential Good    Clinical Decision Making Several treatment options, min-mod task modification necessary    Comorbidities Affecting Occupational Performance: May have comorbidities impacting occupational performance    Modification or Assistance to Complete Evaluation  No modification of tasks or assist necessary to complete eval    OT Frequency 2x / week    OT Duration 6 weeks   +eval   OT Treatment/Interventions Self-care/ADL training;DME and/or AE instruction;Moist Heat;Balance training;Therapeutic activities;Therapeutic exercise;Cognitive remediation/compensation;Cryotherapy;Neuromuscular education;Functional Mobility  Training;Passive range of motion;Patient/family education;Visual/perceptual remediation/compensation    Plan following complex directions and recipes, cooking, physical/cognitive tasks    Consulted and Agree with Plan of Care Patient           Patient will benefit from skilled therapeutic intervention in order to improve the  following deficits and impairments:   Body Structure / Function / Physical Skills: ADL, Balance, IADL, Strength, UE functional use, Decreased knowledge of precautions Cognitive Skills: Attention, Memory     Visit Diagnosis: Hemiplegia and hemiparesis following cerebral infarction affecting right dominant side (HCC)  Muscle weakness (generalized)  Unsteadiness on feet  Other symptoms and signs involving cognitive functions following cerebral infarction    Problem List Patient Active Problem List   Diagnosis Date Noted  . SAH (subarachnoid hemorrhage) (HCC) - L frontal parafalcine, present on admission w/ infarcts 07/20/2020  . 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 Essential hypertension 07/20/2020  . Hyperlipidemia 07/20/2020  . Urinary retention 07/20/2020  . Obesity 07/20/2020  . Hypokalemia 07/20/2020  . Hyperglycemia   . PFO (patent foramen ovale) 07/19/2020  . Acute L ACA, R cerebellar, L temporoparietal infarcts (HCC)  07/17/2020  . Neurologic abnormality   . 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 [place], [place], [address] Phone: [phone]   Fax:  [phone]  Name: MARKELLE ASARO MRN: [id number] Date of Birth: [birth year]

## 2020-09-19 NOTE — Therapy (Signed)
[facility] [address] [place], [place], [address] Phone: [phone]   Fax:  [phone]  Physical Therapy Treatment/Discharge Summary  Patient Details  Name: Tanya Lopez MRN: [id number] Date of Birth: 29-Apr-1971 Referring Provider (PT): Lauraine Rinne, PA-C. Followed up by: Alger Simons, MD  PHYSICAL THERAPY DISCHARGE SUMMARY  Visits from Start of Care: 10  Current functional level related to goals / functional outcomes: See Clinical Impression Statements for Details   Remaining deficits: Low Fall Risk   Education / Equipment: Educated on HEP and walking program.    Plan: Patient agrees to discharge.  Patient goals were met. Patient is being discharged due to meeting the stated rehab goals.  ?????         Encounter Date: 09/19/2020   PT End of Session - 09/19/20 0848    Visit Number 10    Number of Visits 17    Date for PT Re-Evaluation 11/10/20   POC for 8 weeks, Cert for 90 days   Authorization Type BCBS (30 visit limit between OT/PT)    PT Start Time 0847    PT Stop Time 0915    PT Time Calculation (min) 28 min    Equipment Utilized During Treatment Gait belt    Activity Tolerance Patient tolerated treatment well    Behavior During Therapy WFL for tasks assessed/performed;Flat affect           Past Medical History:  Diagnosis Date   Hypertension    PFO (patent foramen ovale) 07/19/2020   Noted on TEE 07/19/2020    Past Surgical History:  Procedure Laterality Date   IR ANGIO INTRA EXTRACRAN SEL COM CAROTID INNOMINATE BILAT MOD SED  07/17/2020   IR ANGIO VERTEBRAL SEL VERTEBRAL BILAT MOD SED  07/17/2020   IR CT HEAD LTD  07/17/2020   RADIOLOGY WITH ANESTHESIA N/A 07/16/2020   Procedure: IR WITH ANESTHESIA;  Surgeon: Luanne Bras, MD;  Location: [place];  Service: Radiology;  Laterality: N/A;   TEE WITHOUT CARDIOVERSION N/A 07/19/2020   Procedure: TRANSESOPHAGEAL  ECHOCARDIOGRAM (TEE);  Surgeon: Skeet Latch, MD;  Location: [place];  Service: Cardiovascular;  Laterality: N/A;    There were no vitals filed for this visit.   Subjective Assessment - 09/19/20 0850    Subjective Patient reports that exercises update going well, feels comfortable with all of the exercises. No pain to report. No falls.    Patient is accompained by: Family member   spouse in lobby   Pertinent History PMH: Hypertension, Patient Foramen Ovale    Limitations Walking    Patient Stated Goals To be able to drive; to be able to talk    Currently in Pain? No/denies              [facility] PT Assessment - 09/19/20 0001      Functional Gait  Assessment   Gait assessed  Yes    Gait Level Surface Walks 20 ft in less than 7 sec but greater than 5.5 sec, uses assistive device, slower speed, mild gait deviations, or deviates 6-10 in outside of the 12 in walkway width.    Change in Gait Speed Able to smoothly change walking speed without loss of balance or gait deviation. Deviate no more than 6 in outside of the 12 in walkway width.    Gait with Horizontal Head Turns Performs head turns smoothly with no change in gait. Deviates no more than 6 in outside 12 in walkway width  Gait with Vertical Head Turns Performs head turns with no change in gait. Deviates no more than 6 in outside 12 in walkway width.    Gait and Pivot Turn Pivot turns safely within 3 sec and stops quickly with no loss of balance.    Step Over Obstacle Is able to step over 2 stacked shoe boxes taped together (9 in total height) without changing gait speed. No evidence of imbalance.    Gait with Narrow Base of Support Is able to ambulate for 10 steps heel to toe with no staggering.    Gait with Eyes Closed Walks 20 ft, uses assistive device, slower speed, mild gait deviations, deviates 6-10 in outside 12 in walkway width. Ambulates 20 ft in less than 9 sec but greater than 7 sec.    Ambulating Backwards Walks 20  ft, uses assistive device, slower speed, mild gait deviations, deviates 6-10 in outside 12 in walkway width.    Steps Alternating feet, no rail.    Total Score 27    FGA comment: 27/30 = Low Fall Risk                         [facility] Adult PT Treatment/Exercise - 09/19/20 0001      Transfers   Transfers Sit to Stand;Stand to Sit    Sit to Stand 6: Modified independent (Device/Increase time)    Five time sit to stand comments  9.31 secs w/o UE support    Stand to Sit 6: Modified independent (Device/Increase time)      Ambulation/Gait   Ambulation/Gait Yes    Ambulation/Gait Assistance 6: Modified independent (Device/Increase time)    Ambulation/Gait Assistance Details completed ambulation indoors/outdoors on level/unlevel surfaces. Patient demo no instances of imbalance with completion. Ambulating at Mod I level without AD and no AFO donned.     Ambulation Distance (Feet) 750 Feet    Assistive device None    Gait Pattern Step-through pattern;Decreased arm swing - right    Ambulation Surface Level;Indoor;Outdoor;Paved;Unlevel    Gait velocity 9.22 secs = 3.55 ft/sec    Stairs Yes    Stairs Assistance 6: Modified independent (Device/Increase time)    Stair Management Technique No rails;Alternating pattern;Forwards    Number of Stairs 8    Height of Stairs 6      Standardized Balance Assessment   Standardized Balance Assessment Timed Up and Go Test      Timed Up and Go Test   TUG Normal TUG    Normal TUG (seconds) 8.78   w/o AD            Access Code: [email or web address] URL: [email or web address] Date: 09/15/2020 Prepared by: Baldomero Lamy  Exercises Sit to Stand - 1 x daily - 5 x weekly - 3 sets - 10 reps Side Stepping with Resistance at Thighs and Counter Support - 1 x daily - 5 x weekly - 3 sets - 10 reps Marching with Resistance at Countertop - 1 x daily - 5 x weekly - 2 sets - 10 reps Standing Hip Extension with Counter Support - 1 x daily - 5 x  weekly - 2 sets - 10 reps Mini Squat with Counter Support - 1 x daily - 5 x weekly - 3 sets - 10 reps Standing Toe Raises with Support - 1 x daily - 5 x weekly - 3 sets - 10 reps Seated Ankle Dorsiflexion with Anchored Resistance - 1 x daily - 5 x weekly - 2 sets -  10 reps Tandem Stance with Head Rotation on Foam Pad - 1 x daily - 5 x weekly - 2 sets - 10 reps Romberg Stance Eyes Closed on Foam Pad - 1 x daily - 5 x weekly - 1 sets - 3 reps - 30 seconds hold Single Leg Stance on Foam Pad - 1 x daily - 5 x weekly - 1 sets - 3 reps - 15-20 seconds hold        PT Short Term Goals - 09/19/20 2229      PT SHORT TERM GOAL #1   Title Patient will be independent with Initial HEP for strenghtening/balance (All STG due: 09/09/2020)    Baseline patient reports independence    Time 4    Period Weeks    Status Achieved    Target Date 09/09/20      PT SHORT TERM GOAL #2   Title Patient will demo ability to ambulate >500 ft on indoor surfaces with LRAD and Supervision    Baseline 750' indoor/outdoor, Mod I    Time 4    Period Weeks    Status Achieved      PT SHORT TERM GOAL #3   Title Patient will undergo further Balance Assessment of BERG and LTG set    Baseline 44/56 on 8/23    Time 4    Period Weeks    Status Achieved    Target Date 09/09/20             PT Long Term Goals - 09/19/20 7989      PT LONG TERM GOAL #1   Title Patient will be independent with Final HEP (all LTGs due: 10/07/2020)    Baseline patient reports independence with HEP    Time 8    Period Weeks    Status Achieved      PT LONG TERM GOAL #2   Title Patient will improve Berg Balance by 5 points from baseline to demonstrate improved balance and reduced fall risk    Baseline 44/56 on 8/23, 56/56 on 9/23    Time 8    Period Weeks    Status Achieved      PT LONG TERM GOAL #3   Title Patient will demonsrate ability to ascend/descend stairs 4 stairs at supervision level without UE support    Baseline  ascend/descend without UE support, Mod I    Time 8    Period Weeks    Status Achieved      PT LONG TERM GOAL #4   Title Patient will improve TUG <12 seconds with LRAD to demo reduced fall risk    Baseline 18.56 secs, 8.78 secs w/o AD    Time 8    Period Weeks    Status Achieved      PT LONG TERM GOAL #5   Title Patient will improve 5x sit<> stand to <12 seconds with UE support to demonstrate improved functional strength and mobility    Baseline 15.97 secs, 9.31 secs w/o UE support    Time 8    Period Weeks    Status Achieved      PT LONG TERM GOAL #6   Title Patient will improve gait speed to 2.5 ft/sec to demonstrate improved community ambulation    Baseline 1.90 ft/sec, 3.55 ft/sec    Time 8    Period Weeks    Status Achieved      PT LONG TERM GOAL #7   Title Pt will incr FGA score to at least  a 24/30 in order to demo decr fall risk.    Baseline 18/30 on 09/01/20, 27/30 on 9/27    Time 8    Period Weeks    Status Achieved                 Plan - 09/19/20 0916    Clinical Impression Statement Today's skilled PT session included assessment of patient's progress toward all STG/LTGs. Patient able to meet all goals, and demonstrating improved activity tolernace, BLE strength, and balance. Patient is at a low risk for falls with FGA score of 27/30. Patient is ambulating at Mod I level with gait speed of 3.55 ft/sec demonstrating community ambulator. Patient has made signficant progress with PT services and demos readiness for discharge, with patient verbalizing agreement.    Personal Factors and Comorbidities Comorbidity 2    Comorbidities HTN, Patent Foramen Ovale    Examination-Activity Limitations Stairs;Transfers;Stand;Bed Mobility    Examination-Participation Restrictions Community Activity;Driving;Yard Work    Merchant navy officer Evolving/Moderate complexity    Rehab Potential Good    PT Frequency 2x / week    PT Duration 8 weeks    PT  Treatment/Interventions ADLs/Self Care Home Management;Aquatic Therapy;Electrical Stimulation;Moist Heat;DME Instruction;Gait training;Stair training;Functional mobility training;Therapeutic activities;Therapeutic exercise;Balance training;Neuromuscular re-education;Patient/family education;Orthotic Fit/Training;Manual techniques;Passive range of motion;Energy conservation    Consulted and Agree with Plan of Care Patient;Family member/caregiver    Family Member Consulted Husband           Patient will benefit from skilled therapeutic intervention in order to improve the following deficits and impairments:  Abnormal gait, Decreased balance, Decreased endurance, Decreased mobility, Difficulty walking, Decreased range of motion, Decreased activity tolerance, Decreased knowledge of use of DME, Decreased coordination, Decreased strength  Visit Diagnosis: Hemiplegia and hemiparesis following cerebral infarction affecting right dominant side (HCC)  Other abnormalities of gait and mobility  Muscle weakness (generalized)  Foot drop, right  Unsteadiness on feet     Problem List Patient Active Problem List   Diagnosis Date Noted   SAH (subarachnoid hemorrhage) ([place]) - L frontal parafalcine, present on admission w/ infarcts 07/20/2020   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

## 2020-09-21 ENCOUNTER — Ambulatory Visit: Payer: BC Managed Care – PPO | Admitting: Occupational Therapy

## 2020-09-21 ENCOUNTER — Ambulatory Visit: Payer: BC Managed Care – PPO | Admitting: Speech Pathology

## 2020-09-21 ENCOUNTER — Encounter: Payer: Self-pay | Admitting: Speech Pathology

## 2020-09-21 ENCOUNTER — Ambulatory Visit: Payer: BC Managed Care – PPO

## 2020-09-21 ENCOUNTER — Other Ambulatory Visit: Payer: Self-pay

## 2020-09-21 DIAGNOSIS — R4701 Aphasia: Secondary | ICD-10-CM

## 2020-09-21 DIAGNOSIS — I69318 Other symptoms and signs involving cognitive functions following cerebral infarction: Secondary | ICD-10-CM

## 2020-09-21 DIAGNOSIS — I69351 Hemiplegia and hemiparesis following cerebral infarction affecting right dominant side: Secondary | ICD-10-CM

## 2020-09-21 NOTE — Therapy (Signed)
[facility] [address] [place], [place], [address] Phone: [phone]   Fax:  [phone]  Speech Language Pathology Treatment  Patient Details  Name: Tanya Lopez MRN: [id number] Date of Birth: October 14, 1971 Referring Provider (SLP): Dr. Alger Simons   Encounter Date: 09/21/2020   End of Session - 09/21/20 1222    Visit Number 9    Number of Visits 17    Date for SLP Re-Evaluation 10/10/20    SLP Start Time 0847    SLP Stop Time  0928    SLP Time Calculation (min) 41 min    Activity Tolerance Patient tolerated treatment well;Other (comment)           Past Medical History:  Diagnosis Date  . Hypertension   . PFO (patent foramen ovale) 07/19/2020   Noted on TEE 07/19/2020    Past Surgical History:  Procedure Laterality Date  . IR ANGIO INTRA EXTRACRAN SEL COM CAROTID INNOMINATE BILAT MOD SED  07/17/2020  . IR ANGIO VERTEBRAL SEL VERTEBRAL BILAT MOD SED  07/17/2020  . IR CT HEAD LTD  07/17/2020  . RADIOLOGY WITH ANESTHESIA N/A 07/16/2020   Procedure: IR WITH ANESTHESIA;  Surgeon: Luanne Bras, MD;  Location: [facility];  Service: Radiology;  Laterality: N/A;  . TEE WITHOUT CARDIOVERSION N/A 07/19/2020   Procedure: TRANSESOPHAGEAL ECHOCARDIOGRAM (TEE);  Surgeon: Skeet Latch, MD;  Location: [facility];  Service: Cardiovascular;  Laterality: N/A;    There were no vitals filed for this visit.   Subjective Assessment - 09/21/20 0850    Subjective "I think I would be worn out" re: driving    Currently in Pain? No/denies                 ADULT SLP TREATMENT - 09/21/20 0850      General Information   Behavior/Cognition Alert;Cooperative;Pleasant mood      Treatment Provided   Treatment provided Cognitive-Linquistic      Cognitive-Linquistic Treatment   Treatment focused on Cognition;Aphasia;Patient/family/caregiver education    Skilled Treatment Fred continues to cook, beginning to  clean and reports fatigue. She has edited several photos with success. Tanya Lopez has had limited interactions outside of family, however she plans to attend a ceremony at her daughters school tomorrow evening. Educated pt in energy conservation for event. Tanya Lopez alternated attention between conversation and structured complex word finding/sentence generation task with mod I, and usual extened time to process rapid topic shifts. Tanya Lopez sucessfully wrote 2 paragraph email to her accountant - she independently double checks her emails and self corrects errors. 3 Word finding episodes with successful use of compensations.       Assessment / Recommendations / Plan   Plan Continue with current plan of care      Progression Toward Goals   Progression toward goals Progressing toward goals            SLP Education - 09/21/20 0937    Education Details compensations for attention, energy conservation    Person(s) Educated Patient    Methods Explanation;Handout    Comprehension Verbalized understanding            SLP Short Term Goals - 09/21/20 1221      SLP SHORT TERM GOAL #1   Title Pt will name 10 items in personally relevant categorie swith rare min A    Baseline 09-08-20    Time 1    Period Weeks    Status Partially Met      SLP  SHORT TERM GOAL #2   Title Pt will utilize compensations for anomia in structured naming tasks with rare min A over 2 sessions    Time 1    Period Weeks    Status Achieved      SLP SHORT TERM GOAL #3   Title Pt will ID and correct written errors at sentence level with rare min A over 2 sessions    Time 1    Period Weeks    Status Not Met            SLP Long Term Goals - 09/21/20 1221      SLP LONG TERM GOAL #1   Title Pt will utilize compensations for aphasia as needed in 20 minute mildly complex conversation with mod I over 2 sessions    Baseline 09-19-20    Time 4    Period Weeks    Status On-going      SLP LONG TERM GOAL #2   Title Pt  will write or email 2-3 paragraph message self correcting errors with rare min A over 2 sessions    Time 4    Period Weeks    Status On-going      SLP LONG TERM GOAL #3   Title Pt will complete complex naming tasks with 90% accuracy over 2 sessions rare min A    Time 4    Period Weeks    Status On-going      SLP LONG TERM GOAL #4   Title pt will demo 15 minutes selective attention to complete mod complex cognitive linguistic tasks, in mod noisy environment x2 sessions    Time 4    Period Weeks    Status New      SLP LONG TERM GOAL #5   Title pt will complete tasks with 100% accuracy by demonstrating error awareness (self correcting/double checking) x3 sessions    Time 4    Period Weeks    Status On-going            Plan - 09/21/20 6333    Clinical Impression Statement Mild anomic aphasia persists. Tanya Lopez notes that she is not paying bills like she did prior to CVA and SLP asked her to set a date to pay bills with husband (who is paying them now) and put in on her calendar which she states she uses successfully.  Pt used language compensations with occasional extra time (rarely distracting for listener). Ongoing trainingn for compensations for cognition and aphasia, as well as maximize communication/word finding to eventual return to profession and QOL.If progress continues, possible d/c next few visits.    Speech Therapy Frequency 2x / week    Duration --   8 weeks or 17 visits   Treatment/Interventions Language facilitation;Environmental controls;Cueing hierarchy;SLP instruction and feedback;Compensatory strategies;Functional tasks;Compensatory techniques;Patient/family education;Multimodal communcation approach;Internal/external aids    Potential to Achieve Goals Good           Patient will benefit from skilled therapeutic intervention in order to improve the following deficits and impairments:   Aphasia    Problem List Patient Active Problem List   Diagnosis Date  Noted  . SAH (subarachnoid hemorrhage) (HCC) - L frontal parafalcine, present on admission w/ infarcts 07/20/2020  . 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 Essential hypertension 07/20/2020  . Hyperlipidemia 07/20/2020  . Urinary retention 07/20/2020  . Obesity 07/20/2020  . Hypokalemia 07/20/2020  . Hyperglycemia   . PFO (patent foramen ovale) 07/19/2020  .  Acute L ACA, R cerebellar, L temporoparietal infarcts ([place])  07/17/2020  . Neurologic abnormality   . 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 [place], [place], [address] Phone: [phone]   Fax:  [phone]   Name: BELISA EICHHOLZ MRN: [id number] Date of Birth: 12-Oct-1971

## 2020-09-21 NOTE — Therapy (Signed)
[facility] [address] [place], [place], [address] Phone: [phone]   Fax:  [phone]  Occupational Therapy Treatment  Patient Details  Name: Tanya Lopez MRN: [id number] Date of Birth: 1971/09/17 Referring Provider (OT): Dr. Ivory Broad   Encounter Date: 09/21/2020   OT End of Session - 09/21/20 0954    Visit Number 4    Number of Visits 13    Date for OT Re-Evaluation 10/27/20    Authorization Type [employer] 2021, 30 visit limit combined OT/PT (each visit of each type counts), no auth    Authorization - Visit Number 4    Authorization - Number of Visits 15    OT Start Time 0935    OT Stop Time 1015    OT Time Calculation (min) 40 min    Activity Tolerance Patient tolerated treatment well    Behavior During Therapy [facility] for tasks assessed/performed;Flat affect           Past Medical History:  Diagnosis Date  . Hypertension   . PFO (patent foramen ovale) 07/19/2020   Noted on TEE 07/19/2020    Past Surgical History:  Procedure Laterality Date  . IR ANGIO INTRA EXTRACRAN SEL COM CAROTID INNOMINATE BILAT MOD SED  07/17/2020  . IR ANGIO VERTEBRAL SEL VERTEBRAL BILAT MOD SED  07/17/2020  . IR CT HEAD LTD  07/17/2020  . RADIOLOGY WITH ANESTHESIA N/A 07/16/2020   Procedure: IR WITH ANESTHESIA;  Surgeon: Julieanne Cotton, MD;  [facility];  Service: Radiology;  Laterality: N/A;  . TEE WITHOUT CARDIOVERSION N/A 07/19/2020   Procedure: TRANSESOPHAGEAL ECHOCARDIOGRAM (TEE);  Surgeon: Chilton Si, MD;  [facility];  Service: Cardiovascular;  Laterality: N/A;    There were no vitals filed for this visit.   Subjective Assessment - 09/21/20 0936    Subjective  Pt denies any pain. Pt discharged from PT today.    Patient is accompanied by: Family member   husband   Pertinent History Cerebral Infarction 07/16/20.  PMH:  Hypertension, Patent Foramen Ovale, HLD    Patient Stated Goals be able to  drive, improve RUE strength    Currently in Pain? No/denies           Pt making chocolate muffins following recipe. Pt needed reminder to go back and preheat oven after washing hands. Pt also needed cues to spray pan before putting in batter - ? If due to memory deficits or lack of attention to detail. Pt overall did well and remembered to turn off stove.  Pt following recipes and answering corresponding questions with 100% accuracy.  Pt performing environmental scanning in minimally distracting gym finding 10/13 items on first pass (77% accuracy) while tossing ball Rt hand. Pt found 2/3 remaining items on second pass.                        OT Short Term Goals - 09/21/20 1004      OT SHORT TERM GOAL #1   Title Pt will be independent with HEP for RUE hand strength--check STGs 10/12/20    Time 4    Period Weeks    Status On-going   issued putty 09/15/20     OT SHORT TERM GOAL #2   Title Pt will perform environmental scanning in busy, dynamic environment with 100% accuracy for incr safety with driving.    Time 4    Period Weeks    Status New  OT SHORT TERM GOAL #3   Title Pt will be verbalize understanding of memory/cognitive compensation strategies for ADLs/IADLs prn.    Time 4    Period Weeks    Status On-going   Speech gave memory strategies     OT SHORT TERM GOAL #4   Title Pt will perform mod complex cooking including chopping/cutting mod I.    Time 8    Period Weeks    Status On-going   Pt reports doing some chopping and cooking at home with distant supervision     OT SHORT TERM GOAL #5   Title Pt will perfom mod complex cleaning including reaching high and low mod I.    Time 8    Period Weeks    Status On-going   reached overhead with putting away cones no difficulty 09/15/20            OT Long Term Goals - 09/12/20 1536      OT LONG TERM GOAL #1   Title Pt will be independent with HEP for proximal RUE strength.--check LTGs 10/27/20     Time 6    Period Weeks    Status New      OT LONG TERM GOAL #2   Title Pt will be able to divide attention between at least 1 cognitive and 1 physical task with at least 95% accuracy in prep for driving.    Time 6    Period Weeks    Status New      OT LONG TERM GOAL #3   Title Pt/husband will verbalize understanding of graduated driving recommendations once cleared to drive by physician.    Time 6    Period Weeks    Status New      OT LONG TERM GOAL #4   Title Pt will improve R grip strength by at least 10lbs to assist with opening containers.    Baseline 36.5lbs    Time 6    Period Weeks    Status New                 Plan - 09/21/20 1226    Clinical Impression Statement Pt reports cooking at home familiar dishes from scratch. Pt with mild impairments in attention to detail and memory today with cooking tasks.    OT Occupational Profile and History Detailed Assessment- Review of Records and additional review of physical, cognitive, psychosocial history related to current functional performance    Occupational performance deficits (Please refer to evaluation for details): ADL's;IADL's;Leisure;Work    Games developer / Function / Physical Skills ADL;Balance;IADL;Strength;UE functional use;Decreased knowledge of precautions    Cognitive Skills Attention;Memory    Rehab Potential Good    Clinical Decision Making Several treatment options, min-mod task modification necessary    Comorbidities Affecting Occupational Performance: May have comorbidities impacting occupational performance    Modification or Assistance to Complete Evaluation  No modification of tasks or assist necessary to complete eval    OT Frequency 2x / week    OT Duration 6 weeks   +eval   OT Treatment/Interventions Self-care/ADL training;DME and/or AE instruction;Moist Heat;Balance training;Therapeutic activities;Therapeutic exercise;Cognitive remediation/compensation;Cryotherapy;Neuromuscular  education;Functional Mobility Training;Passive range of motion;Patient/family education;Visual/perceptual remediation/compensation    Plan continue progress towards goals    Consulted and Agree with Plan of Care Patient           Patient will benefit from skilled therapeutic intervention in order to improve the following deficits and impairments:   Body Structure / Function /  Physical Skills: ADL, Balance, IADL, Strength, UE functional use, Decreased knowledge of precautions Cognitive Skills: Attention, Memory     Visit Diagnosis: Hemiplegia and hemiparesis following cerebral infarction affecting right dominant side (HCC)  Other symptoms and signs involving cognitive functions following cerebral infarction    Problem List Patient Active Problem List   Diagnosis Date Noted  . SAH (subarachnoid hemorrhage) (HCC) - L frontal parafalcine, present on admission w/ infarcts 07/20/2020  . 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 Essential hypertension 07/20/2020  . Hyperlipidemia 07/20/2020  . Urinary retention 07/20/2020  . Obesity 07/20/2020  . Hypokalemia 07/20/2020  . Hyperglycemia   . PFO (patent foramen ovale) 07/19/2020  . Acute L ACA, R cerebellar, L temporoparietal infarcts (HCC)  07/17/2020  . Neurologic abnormality   . Stroke (cerebrum) (HCC) 07/16/2020    Kelli Churn, OTR/L 09/21/2020, 1:48 PM  [facility] [address] [place], [place], [address] Phone: [phone]   Fax:  [phone]  Name: LILY VELASQUEZ MRN: [id number] Date of Birth: Jan 07, 1971

## 2020-09-21 NOTE — Patient Instructions (Signed)
   When Tanya Lopez is focusing on an important task (finances, photo tasks, insurance paperwork etc) , she should not be interrupted  - limit distractions of conversations, TV, radio, questions  Use energy conservation when you have something in the evening, rest up during the day  You may benefit from resting in between higher cognitive tasks or longer physical tasks - that's ok  [place] job doing more things out in the community!! Keep up the good work - your endurance will continue to improve  When you go out with larger groups initially, have a signal with your husband if you are getting tired or dizzy or just want out of a conversation

## 2020-09-26 ENCOUNTER — Other Ambulatory Visit: Payer: Self-pay

## 2020-09-26 ENCOUNTER — Ambulatory Visit: Payer: BC Managed Care – PPO | Attending: Physician Assistant

## 2020-09-26 DIAGNOSIS — M6281 Muscle weakness (generalized): Secondary | ICD-10-CM | POA: Insufficient documentation

## 2020-09-26 DIAGNOSIS — R2689 Other abnormalities of gait and mobility: Secondary | ICD-10-CM | POA: Insufficient documentation

## 2020-09-26 DIAGNOSIS — R41841 Cognitive communication deficit: Secondary | ICD-10-CM

## 2020-09-26 DIAGNOSIS — I69318 Other symptoms and signs involving cognitive functions following cerebral infarction: Secondary | ICD-10-CM | POA: Diagnosis present

## 2020-09-26 DIAGNOSIS — R41840 Attention and concentration deficit: Secondary | ICD-10-CM | POA: Diagnosis present

## 2020-09-26 DIAGNOSIS — I69351 Hemiplegia and hemiparesis following cerebral infarction affecting right dominant side: Secondary | ICD-10-CM | POA: Insufficient documentation

## 2020-09-26 DIAGNOSIS — R2681 Unsteadiness on feet: Secondary | ICD-10-CM | POA: Diagnosis present

## 2020-09-26 DIAGNOSIS — R4701 Aphasia: Secondary | ICD-10-CM

## 2020-09-26 NOTE — Therapy (Signed)
[facility] [address] [place], [place], [address] Phone: [phone]   Fax:  [phone]  Speech Language Pathology Treatment  Patient Details  Name: Tanya Lopez MRN: [id number] Date of Birth: 08-30-1971 Referring Provider (SLP): Dr. Alger Simons   Encounter Date: 09/26/2020   End of Session - 09/26/20 1725    Visit Number 10    Number of Visits 17    Date for SLP Re-Evaluation 10/10/20    SLP Start Time 1319    SLP Stop Time  1400    SLP Time Calculation (min) 41 min    Activity Tolerance Patient tolerated treatment well;Other (comment)           Past Medical History:  Diagnosis Date   Hypertension    PFO (patent foramen ovale) 07/19/2020   Noted on TEE 07/19/2020    Past Surgical History:  Procedure Laterality Date   IR ANGIO INTRA EXTRACRAN SEL COM CAROTID INNOMINATE BILAT MOD SED  07/17/2020   IR ANGIO VERTEBRAL SEL VERTEBRAL BILAT MOD SED  07/17/2020   IR CT HEAD LTD  07/17/2020   RADIOLOGY WITH ANESTHESIA N/A 07/16/2020   Procedure: IR WITH ANESTHESIA;  Surgeon: Luanne Bras, MD;  Location: [facility];  Service: Radiology;  Laterality: N/A;   TEE WITHOUT CARDIOVERSION N/A 07/19/2020   Procedure: TRANSESOPHAGEAL ECHOCARDIOGRAM (TEE);  Surgeon: Skeet Latch, MD;  Location: [facility];  Service: Cardiovascular;  Laterality: N/A;    There were no vitals filed for this visit.   Subjective Assessment - 09/26/20 1722    Subjective "Hi."    Currently in Pain? No/denies                 ADULT SLP TREATMENT - 09/26/20 1336      General Information   Behavior/Cognition Alert;Cooperative;Pleasant mood      Treatment Provided   Treatment provided Cognitive-Linquistic      Cognitive-Linquistic Treatment   Treatment focused on Cognition;Aphasia    Skilled Treatment [name] was in a few/four speaking situations over the weekend, and when SLP asked if she had any difficulties,  she stated, "No." In mod complex converstion with SLP today SLP assessed her language as functional in 26 minutes of conversation, using compensations or circumlocution and synonym successfully. No SLP questioning cues necessary for comprehension. She held topic WNL. Transitions to other conversational topics was WNL and not abrubt or uncomfortable.        Assessment / Recommendations / Plan   Plan Continue with current plan of care   likely d/c in next 1-2 sessions     Progression Toward Goals   Progression toward goals Progressing toward goals              SLP Short Term Goals - 09/21/20 1221      SLP SHORT TERM GOAL #1   Title Pt will name 10 items in personally relevant categorie swith rare min A    Baseline 09-08-20    Time 1    Period Weeks    Status Partially Met      SLP SHORT TERM GOAL #2   Title Pt will utilize compensations for anomia in structured naming tasks with rare min A over 2 sessions    Time 1    Period Weeks    Status Achieved      SLP SHORT TERM GOAL #3   Title Pt will ID and correct written errors at sentence level with rare min A over 2 sessions  Time 1    Period Weeks    Status Not Met            SLP Long Term Goals - 09/26/20 1324      SLP LONG TERM GOAL #1   Title Pt will utilize compensations for aphasia as needed in 20 minute mildly complex conversation with mod I over 2 sessions    Baseline 09-19-20, 09-26-20    Time 3    Period Weeks    Status On-going      SLP LONG TERM GOAL #2   Title Pt will write or email 2-3 paragraph message self correcting errors with rare min A over 2 sessions    Baseline 09-21-20,    Time 3    Period Weeks    Status On-going      SLP LONG TERM GOAL #3   Title Pt will complete complex naming tasks with 90% accuracy over 2 sessions rare min A    Baseline 09-26-20 (mod complex conversation re: Debutante ball and details with this)    Time 3    Period Weeks    Status On-going      SLP LONG TERM GOAL #4    Title pt will demo 15 minutes selective attention to complete mod complex cognitive linguistic tasks, in mod noisy environment x2 sessions    Time 3    Period Weeks    Status On-going      SLP LONG TERM GOAL #5   Title pt will complete tasks with 100% accuracy by demonstrating error awareness (self correcting/double checking) x3 sessions    Time 3    Period Weeks    Status On-going            Plan - 09/26/20 1726    Clinical Impression Statement Mild anomic aphasia persists. Pat notes that she is now participating in paying bills like she did prior to CVA. Pt used language compensations with occasional extra time (not distracting for listener today). Ongoing training for maximizing communication/word finding to eventual return to profession and QOL.If progress continues, possible d/c next 1-2 visits.    Speech Therapy Frequency 2x / week    Duration --   8 weeks or 17 visits   Treatment/Interventions Language facilitation;Environmental controls;Cueing hierarchy;SLP instruction and feedback;Compensatory strategies;Functional tasks;Compensatory techniques;Patient/family education;Multimodal communcation approach;Internal/external aids    Potential to Achieve Goals Good           Patient will benefit from skilled therapeutic intervention in order to improve the following deficits and impairments:   Aphasia  Cognitive communication deficit    Problem List Patient Active Problem List   Diagnosis Date Noted   SAH (subarachnoid hemorrhage) ([place]) - L frontal parafalcine, present on admission w/ infarcts 07/20/2020   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

## 2020-09-28 ENCOUNTER — Ambulatory Visit: Payer: BC Managed Care – PPO | Admitting: Speech Pathology

## 2020-09-28 ENCOUNTER — Encounter: Payer: Self-pay | Admitting: Speech Pathology

## 2020-09-28 ENCOUNTER — Other Ambulatory Visit: Payer: Self-pay

## 2020-09-28 DIAGNOSIS — R4701 Aphasia: Secondary | ICD-10-CM

## 2020-09-28 NOTE — Therapy (Addendum)
[facility] [address] [place], [place], [address] Phone: [phone]   Fax:  [phone]  Speech Language Pathology Treatment & Discharge Summary  Patient Details  Name: Tanya Lopez MRN: [id number] Date of Birth: 07-19-1971 Referring Provider (SLP): Dr. Alger Lopez   Encounter Date: 09/28/2020   End of Session - 09/28/20 0957    Visit Number 11    Number of Visits 17    Date for SLP Re-Evaluation 10/10/20    SLP Start Time 0930    SLP Stop Time  0957    SLP Time Calculation (min) 27 min    Activity Tolerance Patient tolerated treatment well           Past Medical History:  Diagnosis Date   Hypertension    PFO (patent foramen ovale) 07/19/2020   Noted on TEE 07/19/2020    Past Surgical History:  Procedure Laterality Date   IR ANGIO INTRA EXTRACRAN SEL COM CAROTID INNOMINATE BILAT MOD SED  07/17/2020   IR ANGIO VERTEBRAL SEL VERTEBRAL BILAT MOD SED  07/17/2020   IR CT HEAD LTD  07/17/2020   RADIOLOGY WITH ANESTHESIA N/A 07/16/2020   Procedure: IR WITH ANESTHESIA;  Surgeon: Tanya Lopez;  Location: [facility];  Service: Radiology;  Laterality: N/A;   TEE WITHOUT CARDIOVERSION N/A 07/19/2020   Procedure: TRANSESOPHAGEAL ECHOCARDIOGRAM (TEE);  Surgeon: Tanya Lopez;  Location: [facility];  Service: Cardiovascular;  Laterality: N/A;    There were no vitals filed for this visit.   Subjective Assessment - 09/28/20 0936    Subjective "I'm fine"    Currently in Pain? No/denies                 ADULT SLP TREATMENT - 09/28/20 0938      General Information   Behavior/Cognition Alert;Cooperative;Pleasant mood      Treatment Provided   Treatment provided Cognitive-Linquistic      Cognitive-Linquistic Treatment   Treatment focused on Cognition;Aphasia;Patient/family/caregiver education    Skilled Treatment [name] has returned to most social activities and house hold  chores.  She is sending longer emails with success and  reduced errors. Birdell rates herself at a 9/10 cognitively and language. She is not having word finding difficulties except with fatigue in the evenings. She has met all goals      Assessment / Recommendations / Plan   Plan Discharge SLP treatment due to (comment)      Progression Toward Goals   Progression toward goals Goals met, education completed, patient discharged from [facility]  Visits from Start of Care: 11  Current functional level related to goals / functional outcomes: See goals below   Remaining deficits: Mild anomia with fatigue   Education / Equipment: Compensations for aphasia, energy conservation, compensations for attention  Plan: Patient agrees to discharge.  Patient goals were met. Patient is being discharged due to meeting the stated rehab goals.  ?????        SLP Short Term Goals - 09/28/20 0956      SLP SHORT TERM GOAL #1   Title Pt will name 10 items in personally relevant categorie swith rare min A    Baseline 09-08-20    Time 1    Period Weeks    Status Partially Met      SLP SHORT TERM GOAL #2   Title Pt will utilize compensations for anomia in structured naming  tasks with rare min A over 2 sessions    Time 1    Period Weeks    Status Achieved      SLP SHORT TERM GOAL #3   Title Pt will ID and correct written errors at sentence level with rare min A over 2 sessions    Time 1    Period Weeks    Status Not Met            SLP Long Term Goals - 09/28/20 0956      SLP LONG TERM GOAL #1   Title Pt will utilize compensations for aphasia as needed in 20 minute mildly complex conversation with mod I over 2 sessions    Baseline 09-19-20, 09-26-20    Time 3    Period Weeks    Status Achieved      SLP LONG TERM GOAL #2   Title Pt will write or email 2-3 paragraph message self correcting errors with rare min A over 2 sessions    Baseline 09-21-20,     Time 3    Period Weeks    Status On-going      SLP LONG TERM GOAL #3   Title Pt will complete complex naming tasks with 90% accuracy over 2 sessions rare min A    Baseline 09-26-20 (mod complex conversation re: Debutante ball and details with this); 09/28/20    Time 3    Period Weeks    Status Achieved      SLP LONG TERM GOAL #4   Title pt will demo 15 minutes selective attention to complete mod complex cognitive linguistic tasks, in mod noisy environment x2 sessions    Time 3    Period Weeks    Status On-going      SLP LONG TERM GOAL #5   Title pt will complete tasks with 100% accuracy by demonstrating error awareness (self correcting/double checking) x3 sessions    Time 3    Period Weeks    Status Achieved            Plan - 09/28/20 0951    Clinical Impression Statement Slight anomic aphasia with fatigue, otherwise no word finding difficulties noted today. Pt has met all goals and reporots increased confidence conversing with non family.  She is writing emais independently and re-engaged in social and professional activities as she is physically able. Goals met, d/c ST. Pt in agreement    Speech Therapy Frequency 2x / week    Duration --   8 weeks or 17 visits   Treatment/Interventions Language facilitation;Environmental controls;Cueing hierarchy;SLP instruction and feedback;Compensatory strategies;Functional tasks;Compensatory techniques;Patient/family education;Multimodal communcation approach;Internal/external aids    Potential to Achieve Goals Good           Patient will benefit from skilled therapeutic intervention in order to improve the following deficits and impairments:   Aphasia    Problem List Patient Active Problem List   Diagnosis Date Noted   SAH (subarachnoid hemorrhage) ([facility]) - L frontal parafalcine, present on admission w/ infarcts 07/20/2020   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

## 2020-10-03 ENCOUNTER — Ambulatory Visit: Payer: BC Managed Care – PPO | Admitting: Speech Pathology

## 2020-10-04 ENCOUNTER — Other Ambulatory Visit: Payer: Self-pay | Admitting: Cardiovascular Disease

## 2020-10-04 DIAGNOSIS — I639 Cerebral infarction, unspecified: Secondary | ICD-10-CM

## 2020-10-04 DIAGNOSIS — I4891 Unspecified atrial fibrillation: Secondary | ICD-10-CM

## 2020-10-04 DIAGNOSIS — Q211 Atrial septal defect: Secondary | ICD-10-CM

## 2020-10-04 DIAGNOSIS — Q2112 Patent foramen ovale: Secondary | ICD-10-CM

## 2020-10-10 ENCOUNTER — Encounter: Payer: Self-pay | Admitting: Occupational Therapy

## 2020-10-10 ENCOUNTER — Ambulatory Visit: Payer: BC Managed Care – PPO | Admitting: Occupational Therapy

## 2020-10-10 ENCOUNTER — Other Ambulatory Visit: Payer: Self-pay

## 2020-10-10 DIAGNOSIS — R4701 Aphasia: Secondary | ICD-10-CM | POA: Diagnosis not present

## 2020-10-10 DIAGNOSIS — I69351 Hemiplegia and hemiparesis following cerebral infarction affecting right dominant side: Secondary | ICD-10-CM

## 2020-10-10 DIAGNOSIS — M6281 Muscle weakness (generalized): Secondary | ICD-10-CM

## 2020-10-10 DIAGNOSIS — R2689 Other abnormalities of gait and mobility: Secondary | ICD-10-CM

## 2020-10-10 DIAGNOSIS — I69318 Other symptoms and signs involving cognitive functions following cerebral infarction: Secondary | ICD-10-CM

## 2020-10-10 DIAGNOSIS — R2681 Unsteadiness on feet: Secondary | ICD-10-CM

## 2020-10-10 NOTE — Patient Instructions (Signed)
Reviewed back to driving recommendations:  Start with parking lot with spouse supervision. Neighborhood roads with supervision. Avoid busy highways or interchanges.

## 2020-10-10 NOTE — Therapy (Signed)
[facility] [address] [place], [place], [address] Phone: [phone]   Fax:  [phone]  Occupational Therapy Treatment  Patient Details  Name: Tanya AMBROCIO MRN: [id number] Date of Birth: [birth year] Referring Provider (OT): Dr. Oval Linsey   Encounter Date: 10/10/2020   OT End of Session - 10/10/20 1533    Visit Number 5    Number of Visits 13    Date for OT Re-Evaluation 10/27/20    Authorization Type [employer] 2021, 30 visit limit combined OT/PT (each visit of each type counts), no auth    Authorization - Visit Number 5    Authorization - Number of Visits 15    OT Start Time 1534    OT Stop Time 1615    OT Time Calculation (min) 41 min    Activity Tolerance Patient tolerated treatment well    Behavior During Therapy [facility] for tasks assessed/performed;Flat affect           Past Medical History:  Diagnosis Date  . Hypertension   . PFO (patent foramen ovale) 07/19/2020   Noted on TEE 07/19/2020    Past Surgical History:  Procedure Laterality Date  . IR ANGIO INTRA EXTRACRAN SEL COM CAROTID INNOMINATE BILAT MOD SED  07/17/2020  . IR ANGIO VERTEBRAL SEL VERTEBRAL BILAT MOD SED  07/17/2020  . IR CT HEAD LTD  07/17/2020  . RADIOLOGY WITH ANESTHESIA N/A 07/16/2020   Procedure: IR WITH ANESTHESIA;  Surgeon: Luanne Bras, MD;  Location: [facility];  Service: Radiology;  Laterality: N/A;  . TEE WITHOUT CARDIOVERSION N/A 07/19/2020   Procedure: TRANSESOPHAGEAL ECHOCARDIOGRAM (TEE);  Surgeon: Skeet Latch, MD;  Location: [facility];  Service: Cardiovascular;  Laterality: N/A;    There were no vitals filed for this visit.   Subjective Assessment - 10/10/20 1534    Subjective  Pt reports lifting heavy things is still difficult. Pt is still not driving. Pt denies pain. Pt reports back to baseline with cooking.    Patient is accompanied by: Family member   husband   Pertinent History Cerebral Infarction  07/16/20.  PMH:  Hypertension, Patent Foramen Ovale, HLD    Patient Stated Goals be able to drive, improve RUE strength    Currently in Pain? No/denies                        OT Treatments/Exercises (OP) - 10/10/20 1542      ADLs   Cooking pt reports back to PLOF for cooking      Cognitive Exercises   Problem Solving functional problem solving with organizing your day.. difficulty with task switching with therapist engaging in conversation while completing task.  otherwise, required increased tiem but no additional assistance for completion.      Hand Exercises   Other Hand Exercises hand gripper with level 2 and 1 inch blocks RUE    Other Hand Exercises Grip Strength RUE 46.2 lbs      Visual/Perceptual Exercises   Scanning Environmental    Scanning - Environmental 100% accuracy    Other Exercises 24 pc puzzle with no difficulty      Neurological Re-education Exercises   Theraputty - Flatten upgraded to green                  OT Education - 10/10/20 1541    Education Details upgraded theraputty to green today. reviewed back to driving recommendations and strategies    Person(s) Educated Patient  Methods Explanation    Comprehension Verbalized understanding;Need further instruction            OT Short Term Goals - 10/10/20 1538      OT SHORT TERM GOAL #1   Title Pt will be independent with HEP for RUE hand strength--check STGs 10/12/20    Time 4    Period Weeks    Status Achieved   issued putty 09/15/20     OT SHORT TERM GOAL #2   Title Pt will perform environmental scanning in busy, dynamic environment with 100% accuracy for incr safety with driving.    Time 4    Period Weeks    Status Achieved   15/15     OT SHORT TERM GOAL #3   Title Pt will be verbalize understanding of memory/cognitive compensation strategies for ADLs/IADLs prn.    Time 4    Period Weeks    Status On-going   Speech gave memory strategies     OT SHORT TERM GOAL #4    Title Pt will perform mod complex cooking including chopping/cutting mod I.    Time 8    Period Weeks    Status Achieved   Pt reports doing some chopping and cooking at home with distant supervision     OT SHORT TERM GOAL #5   Title Pt will perfom mod complex cleaning including reaching high and low mod I.    Time 8    Period Weeks    Status Achieved   reached overhead with putting away cones no difficulty 09/15/20            OT Long Term Goals - 10/10/20 1537      OT LONG TERM GOAL #1   Title Pt will be independent with HEP for proximal RUE strength.--check LTGs 10/27/20    Time 6    Period Weeks    Status On-going   updated to green theraputty     OT LONG TERM GOAL #2   Title Pt will be able to divide attention between at least 1 cognitive and 1 physical task with at least 95% accuracy in prep for driving.    Time 6    Period Weeks    Status New      OT LONG TERM GOAL #3   Title Pt/husband will verbalize understanding of graduated driving recommendations once cleared to drive by physician.    Time 6    Period Weeks    Status On-going   reviewed recommendations     OT LONG TERM GOAL #4   Title Pt will improve R grip strength by at least 10lbs to assist with opening containers.    Baseline 36.5lbs    Time 6    Period Weeks    Status Achieved   46.2 lbs                Plan - 10/10/20 1534    Clinical Impression Statement Pt has met 4/5 STGs and is progressing towards LTGs. Pt with mild impairments with attention to detail and multitasking. Pt has demonstrated improvements with these areas.    OT Occupational Profile and History Detailed Assessment- Review of Records and additional review of physical, cognitive, psychosocial history related to current functional performance    Occupational performance deficits (Please refer to evaluation for details): ADL's;IADL's;Leisure;Work    Marketing executive / Function / Physical Skills ADL;Balance;IADL;Strength;UE functional  use;Decreased knowledge of precautions    Cognitive Skills Attention;Memory    Rehab Potential  Good    Clinical Decision Making Several treatment options, min-mod task modification necessary    Comorbidities Affecting Occupational Performance: May have comorbidities impacting occupational performance    Modification or Assistance to Complete Evaluation  No modification of tasks or assist necessary to complete eval    OT Frequency 2x / week    OT Duration 6 weeks   +eval   OT Treatment/Interventions Self-care/ADL training;DME and/or AE instruction;Moist Heat;Balance training;Therapeutic activities;Therapeutic exercise;Cognitive remediation/compensation;Cryotherapy;Neuromuscular education;Functional Mobility Training;Passive range of motion;Patient/family education;Visual/perceptual remediation/compensation    Plan continue progress towards goals    Consulted and Agree with Plan of Care Patient           Patient will benefit from skilled therapeutic intervention in order to improve the following deficits and impairments:   Body Structure / Function / Physical Skills: ADL, Balance, IADL, Strength, UE functional use, Decreased knowledge of precautions Cognitive Skills: Attention, Memory     Visit Diagnosis: Hemiplegia and hemiparesis following cerebral infarction affecting right dominant side (HCC)  Muscle weakness (generalized)  Other symptoms and signs involving cognitive functions following cerebral infarction  Unsteadiness on feet  Other abnormalities of gait and mobility    Problem List Patient Active Problem List   Diagnosis Date Noted  . SAH (subarachnoid hemorrhage) (HCC) - L frontal parafalcine, present on admission w/ infarcts 07/20/2020  . 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 Essential hypertension 07/20/2020  . Hyperlipidemia 07/20/2020  . Urinary retention 07/20/2020  . Obesity 07/20/2020  . Hypokalemia 07/20/2020  . Hyperglycemia    . PFO (patent foramen ovale) 07/19/2020  . Acute L ACA, R cerebellar, L temporoparietal infarcts ([facility])  07/17/2020  . Neurologic abnormality   . Stroke (cerebrum) ([facility]) 07/16/2020    Zachery Conch MOT, OTR/L  10/10/2020, 4:18 PM  [facility] [address] [place], [place], [address] Phone: [phone]   Fax:  [phone]  Name: Tanya Lopez MRN: [id number] Date of Birth: 09/21/1971

## 2020-10-14 ENCOUNTER — Ambulatory Visit (INDEPENDENT_AMBULATORY_CARE_PROVIDER_SITE_OTHER): Payer: BC Managed Care – PPO | Admitting: Cardiovascular Disease

## 2020-10-14 ENCOUNTER — Ambulatory Visit: Payer: BC Managed Care – PPO | Admitting: Occupational Therapy

## 2020-10-14 ENCOUNTER — Other Ambulatory Visit: Payer: Self-pay

## 2020-10-14 ENCOUNTER — Encounter: Payer: Self-pay | Admitting: Cardiovascular Disease

## 2020-10-14 VITALS — BP 112/78 | HR 74 | Ht 62.0 in | Wt 184.4 lb

## 2020-10-14 DIAGNOSIS — I69351 Hemiplegia and hemiparesis following cerebral infarction affecting right dominant side: Secondary | ICD-10-CM

## 2020-10-14 DIAGNOSIS — I69318 Other symptoms and signs involving cognitive functions following cerebral infarction: Secondary | ICD-10-CM

## 2020-10-14 DIAGNOSIS — M6281 Muscle weakness (generalized): Secondary | ICD-10-CM

## 2020-10-14 DIAGNOSIS — Q2112 Patent foramen ovale: Secondary | ICD-10-CM

## 2020-10-14 DIAGNOSIS — Q211 Atrial septal defect: Secondary | ICD-10-CM | POA: Diagnosis not present

## 2020-10-14 DIAGNOSIS — R2681 Unsteadiness on feet: Secondary | ICD-10-CM

## 2020-10-14 DIAGNOSIS — R4701 Aphasia: Secondary | ICD-10-CM | POA: Diagnosis not present

## 2020-10-14 NOTE — Progress Notes (Signed)
Cardiology Office Note:    Date:  10/14/2020   ID:  Tanya Lopez, DOB [birth year], MRN [id number]  PCP:  Pcp, No  [facility] Cardiologist:  No primary care provider on file.  [facility] Electrophysiologist:  None   Referring MD: No ref. provider found   Chief Complaint  Patient presents with  . Follow-up    PFO    History of Present Illness:    Tanya Lopez is a 49 y.o. female with a hx of stroke and PFO, presenting for follow-up evaluation.  The patient was initially seen here on August 22, 2020.  She was hospitalized July 24 through July 20, 2020 with an acute stroke after presenting with aphasia and right-sided weakness.  As part of her stroke work-up, there was a small PFO present with right-to-left flow by color Doppler assessment and bubble study.  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  Since I initially saw the patient, she has completed an outpatient event monitor which showed no atrial fibrillation or flutter.  She has also had a follow-up CTA of the head and has planned follow-up at the [facility] neurology clinic next month.  The patient is here with her husband today.  Her husband, Pearson Lopez, is an Administrator, arts in town.  The patient reports that she has been doing well since her last visit.  She has been diagnosed with a uterine fibroid that is large and will require treatment.  It sounds like she has been evaluated by interventional radiology for consideration of an embolization procedure.  There is some discussion about hormonal therapy but concerned about risk of clotting after her recent stroke.  The patient has not had any chest pain or shortness of breath.  Her speech is improved significantly since her initial visit with me.  Past Medical History:  Diagnosis Date  . Hypertension   . PFO (patent foramen ovale) 07/19/2020   Noted on TEE 07/19/2020    Past Surgical History:  Procedure Laterality Date  .  IR ANGIO INTRA EXTRACRAN SEL COM CAROTID INNOMINATE BILAT MOD SED  07/17/2020  . IR ANGIO VERTEBRAL SEL VERTEBRAL BILAT MOD SED  07/17/2020  . IR CT HEAD LTD  07/17/2020  . RADIOLOGY WITH ANESTHESIA N/A 07/16/2020   Procedure: IR WITH ANESTHESIA;  Surgeon: Julieanne Cotton, MD;  [facility];  Service: Radiology;  Laterality: N/A;  . TEE WITHOUT CARDIOVERSION N/A 07/19/2020   Procedure: TRANSESOPHAGEAL ECHOCARDIOGRAM (TEE);  Surgeon: Chilton Si, MD;  Location: [facility] ENDOSCOPY;  Service: Cardiovascular;  Laterality: N/A;    Current Medications: Current Meds  Medication Sig  . amLODipine (NORVASC) 10 MG tablet Take 1 tablet (10 mg total) by mouth daily.  Marland Kitchen aspirin 81 MG EC tablet Take 1 tablet by mouth daily.  Marland Kitchen aspirin EC 325 MG EC tablet Take 1 tablet (325 mg total) by mouth daily.  Marland Kitchen atorvastatin (LIPITOR) 40 MG tablet Take 40 mg by mouth daily.  . clopidogrel (PLAVIX) 75 MG tablet Take 1 tablet (75 mg total) by mouth daily.  . nebivolol (BYSTOLIC) 5 MG tablet Take 1 tablet (5 mg total) by mouth daily.  . [DISCONTINUED] atorvastatin (LIPITOR) 80 MG tablet Take 1 tablet (80 mg total) by mouth daily.     Allergies:   Patient has no known allergies.   Social History   Socioeconomic History  . Marital status: Married    Spouse name: Not on file  . Number of children: Not on file  .  Years of education: Not on file  . Highest education level: Not on file  Occupational History  . Not on file  Tobacco Use  . Smoking status: Never Smoker  . Smokeless tobacco: Never Used  Vaping Use  . Vaping Use: Never used  Substance and Sexual Activity  . Alcohol use: Never  . Drug use: Never  . Sexual activity: Not on file  Other Topics Concern  . Not on file  Social History Narrative  . Not on file   Social Determinants of Health   Financial Resource Strain:   . Difficulty of Paying Living Expenses: Not on file  Food Insecurity:   . Worried About Programme researcher, broadcasting/film/video in the Last  Year: Not on file  . Ran Out of Food in the Last Year: Not on file  Transportation Needs:   . Lack of Transportation (Medical): Not on file  . Lack of Transportation (Non-Medical): Not on file  Physical Activity:   . Days of Exercise per Week: Not on file  . Minutes of Exercise per Session: Not on file  Stress:   . Feeling of Stress : Not on file  Social Connections:   . Frequency of Communication with Friends and Family: Not on file  . Frequency of Social Gatherings with Friends and Family: Not on file  . Attends Religious Services: Not on file  . Active Member of Clubs or Organizations: Not on file  . Attends Banker Meetings: Not on file  . Marital Status: Not on file     Family History: The patient's family history includes High blood pressure in her father and mother; Stroke in her mother.  ROS:   Please see the history of present illness.    All other systems reviewed and are negative.  EKGs/Labs/Other Studies Reviewed:    The following studies were reviewed today: Event monitor 10/04/2020: Study Highlights  1. The basic rhythm is normal sinus with an average HR of 74 bpm 2. No atrial fibrillation or flutter 3. No high-grade heart block or pathologic pauses 4. There are rare PVC's and rare supraventricular beats without sustained arrhythmias  Benign monitor findings   EKG:  EKG is ordered today.  The ekg ordered today demonstrates normal sinus rhythm 74 bpm, within normal limits.  Recent Labs: 07/21/2020: ALT 22 08/08/2020: BUN 15; Creatinine, Ser 0.85; Hemoglobin 11.5; Platelets 295; Potassium 3.7; Sodium 139  Recent Lipid Panel    Component Value Date/Time   CHOL 239 (H) 07/19/2020 0300   TRIG 330 (H) 07/20/2020 0350   HDL 43 07/19/2020 0300   CHOLHDL 5.6 07/19/2020 0300   VLDL 72 (H) 07/19/2020 0300   LDLCALC 124 (H) 07/19/2020 0300   LDLDIRECT 87.5 07/18/2020 0522     Risk Assessment/Calculations:       Physical Exam:    VS:  BP  112/78   Pulse 74   Ht 5\' 2"  (1.575 m)   Wt 184 lb 6.4 oz (83.6 kg)   SpO2 98%   BMI 33.73 kg/m     Wt Readings from Last 3 Encounters:  10/14/20 184 lb 6.4 oz (83.6 kg)  08/25/20 186 lb (84.4 kg)  08/22/20 187 lb (84.8 kg)     GEN:  Well nourished, well developed in no acute distress HEENT: Normal NECK: No JVD; No carotid bruits LYMPHATICS: No lymphadenopathy CARDIAC: RRR, no murmurs, rubs, gallops RESPIRATORY:  Clear to auscultation without rales, wheezing or rhonchi  ABDOMEN: Soft, non-tender, non-distended MUSCULOSKELETAL:  No edema;  No deformity  SKIN: Warm and dry NEUROLOGIC:  Alert and oriented x 3 PSYCHIATRIC:  Normal affect   ASSESSMENT:    1. PFO (patent foramen ovale)    PLAN:    In order of problems listed above:  1. I again reviewed the patient's TEE images.  We discussed a variety of considerations around transcatheter PFO closure.  The patient and her husband understand that there is no way to know for certain whether the PFO was causative in her stroke or just incidentally found as a "innocent bystander."  Anatomically, her PFO is small and there is no associated atrial septal aneurysm.  Transcranial Doppler showed Tanya grade 3 shunt, but TEE suggests a small PFO with no evidence of color-flow Doppler that I can appreciate across the interatrial septum.  The agitated saline study is positive for right to left bubbles occurring at the third heartbeat.  After discussion of treatment options, I am inclined to treat her medically at this point.  With her upcoming neurology evaluation, I would be happy to consider transcatheter closure if neurology feels there is a strong indication.  I think the procedure would be safe and could be offered at very low risk.  However, I am just not sure the clinical benefit would be substantial with an anatomically small PFO.  They would like to return for one more visit in about 3 months to review this option and make a final decision  after they have completed their neurology follow-up.  Will arrange a follow-up appointment in January 2022.   Medication Adjustments/Labs and Tests Ordered: Current medicines are reviewed at length with the patient today.  Concerns regarding medicines are outlined above.  Orders Placed This Encounter  Procedures  . EKG 12-Lead   No orders of the defined types were placed in this encounter.   Patient Instructions  Medication Instructions:  Your provider recommends that you continue on your current medications as directed. Please refer to the Current Medication list given to you today.   *If you need a refill on your cardiac medications before your next appointment, please call your pharmacy*  Follow-Up: Your provider recommends that you schedule a follow-up appointment in January 2022 with Dr. Excell Seltzer.    Signed, Tonny Bollman, MD  10/14/2020 5:02 PM    [facility]

## 2020-10-14 NOTE — Patient Instructions (Addendum)
Memory Compensation Strategies  1. Use "WARM" strategy. W= write it down A=  associate it R=  repeat it M=  make a mental picture  2. You can keep a Glass blower/designer. Use a 3-ring notebook with sections for the following:  calendar, important names and phone numbers, medications, doctors' names/phone numbers, "to do list"/reminders, and a section to journal what you did each day  3. Use a calendar to write appointments down.  4. Write yourself a schedule for the day.  This can be placed on the calendar or in a separate section of the Memory Notebook.  Keeping a regular schedule can help memory.  5. Use medication organizer with sections for each day or morning/evening pills   6. Keep a basket, or pegboard by the door.   Place items that you need to take out with you in the basket or on the pegboard.  You may also want to include a message board for reminders.  7. Use sticky notes. Place sticky notes with reminders in a place where the task is performed.  For example:  "turn off the stove" placed by the stove, "lock the door" placed on the door at eye level, "take your medications" on the bathroom mirror or by the place where you normally take your medications  8. Use alarms/timers.  Use while cooking to remind yourself to check on food or as a reminder to take your medicine, or as a reminder to make a call, or as a reminder to perform another task, etc.  9. Use a small tape recorder to record important information and notes for yourself.            Strengthening: Resisted Flexion   Hold tubing with _right____ arm(s) at side. Pull forward and up. Move shoulder through pain-free range of motion. Repeat __10__ times per set.  Do _1-2_ sessions per day , every other day   Strengthening: Resisted Extension   Hold tubing in __right___ hand(s), arm forward. Pull arm back, elbow straight. Repeat _10___ times per set. Do _1-2___ sessions per day, every other  day.   Resisted Horizontal Abduction: Bilateral   Sit or stand, tubing in both hands, arms out in front. Keeping arms straight, pinch shoulder blades together and stretch arms out. Repeat _10___ times per set. Do _1-2___ sessions per day, every other day.   Elbow Flexion: Resisted   With tubing held in _right_____ hand(s) and other end secured under foot, curl arm up as far as possible. Repeat _10___ times per set. Do _1-2___ sessions per day, every other day.    Elbow Extension: Resisted   Sit in chair with resistive band secured at armrest (or hold with other hand) and __right_____ elbow bent. Straighten elbow. Repeat _10___ times per set.  Do _1-2___ sessions per day, every other day.   Copyright  VHI. All rights reserved.

## 2020-10-14 NOTE — Patient Instructions (Signed)
Medication Instructions:  Your provider recommends that you continue on your current medications as directed. Please refer to the Current Medication list given to you today.   *If you need a refill on your cardiac medications before your next appointment, please call your pharmacy*  Follow-Up: Your provider recommends that you schedule a follow-up appointment in January 2022 with Dr. Excell Seltzer.

## 2020-10-14 NOTE — Therapy (Signed)
[facility] [address] [place], [place], [address] Phone: [phone]   Fax:  [phone]  Occupational Therapy Treatment  Patient Details  Name: Tanya Lopez MRN: [id number] Date of Birth: 1971/05/23 Referring Provider (OT): Dr. Ivory Broad   Encounter Date: 10/14/2020   OT End of Session - 10/14/20 0924    Visit Number 6    Number of Visits 13    Date for OT Re-Evaluation 10/27/20    Authorization Type [employer] 2021, 30 visit limit combined OT/PT (each visit of each type counts), no auth    Authorization - Visit Number 6    Authorization - Number of Visits 15    OT Start Time [phone]    OT Stop Time 0930    OT Time Calculation (min) 32 min    Activity Tolerance Patient tolerated treatment well    Behavior During Therapy [facility] for tasks assessed/performed;Flat affect           Past Medical History:  Diagnosis Date  . Hypertension   . PFO (patent foramen ovale) 07/19/2020   Noted on TEE 07/19/2020    Past Surgical History:  Procedure Laterality Date  . IR ANGIO INTRA EXTRACRAN SEL COM CAROTID INNOMINATE BILAT MOD SED  07/17/2020  . IR ANGIO VERTEBRAL SEL VERTEBRAL BILAT MOD SED  07/17/2020  . IR CT HEAD LTD  07/17/2020  . RADIOLOGY WITH ANESTHESIA N/A 07/16/2020   Procedure: IR WITH ANESTHESIA;  Surgeon: Julieanne Cotton, MD;  Location: [facility] OR;  Service: Radiology;  Laterality: N/A;  . TEE WITHOUT CARDIOVERSION N/A 07/19/2020   Procedure: TRANSESOPHAGEAL ECHOCARDIOGRAM (TEE);  Surgeon: Chilton Si, MD;  Location: [facility] ENDOSCOPY;  Service: Cardiovascular;  Laterality: N/A;    There were no vitals filed for this visit.   Subjective Assessment - 10/14/20 0930    Subjective  Deneis pain    Patient is accompanied by: Family member   husband   Pertinent History Cerebral Infarction 07/16/20.  PMH:  Hypertension, Patent Foramen Ovale, HLD    Patient Stated Goals be able to drive, improve RUE strength     Currently in Pain? No/denies                        Treatment: Arm bike x 6 mins level 3 for conditioning,  Ambulating while tossing ball and naming animals, occasional min v.c no LOB         OT Education - 10/14/20 0920    Education Details upgraded theraband to red today 15 reps each, memory compensation strategeis reviewed,. reviewed back to driving recommendations and strategies- and need for MD clearance    Person(s) Educated Patient    Methods Explanation;Handout;Demonstration;Verbal cues    Comprehension Verbalized understanding;Returned demonstration            OT Short Term Goals - 10/14/20 0918      OT SHORT TERM GOAL #1   Title Pt will be independent with HEP for RUE hand strength--check STGs 10/12/20    Time 4    Period Weeks    Status Achieved   issued putty 09/15/20     OT SHORT TERM GOAL #2   Title Pt will perform environmental scanning in busy, dynamic environment with 100% accuracy for incr safety with driving.    Time 4    Period Weeks    Status Achieved   15/15     OT SHORT TERM GOAL #3   Title Pt will be verbalize  understanding of memory/cognitive compensation strategies for ADLs/IADLs prn.    Time 4    Period Weeks    Status On-going   memory strategies reveiwed and issued 10/14/20     OT SHORT TERM GOAL #4   Title Pt will perform mod complex cooking including chopping/cutting mod I.    Time 8    Period Weeks    Status Achieved   Pt reports doing some chopping and cooking at home with distant supervision     OT SHORT TERM GOAL #5   Title Pt will perfom mod complex cleaning including reaching high and low mod I.    Time 8    Period Weeks    Status Achieved   reached overhead with putting away cones no difficulty 09/15/20            OT Long Term Goals - 10/14/20 0909      OT LONG TERM GOAL #1   Title Pt will be independent with HEP for proximal RUE strength.--check LTGs 10/27/20    Time 6    Period Weeks    Status  On-going   issued red theraband exercises     OT LONG TERM GOAL #2   Title Pt will be able to divide attention between at least 1 cognitive and 1 physical task with at least 95% accuracy in prep for driving.    Time 6    Period Weeks    Status On-going   good performance today only occasional min v.c for naming animals while walking and tossing ball 10/14/20     OT LONG TERM GOAL #3   Title Pt/husband will verbalize understanding of graduated driving recommendations once cleared to drive by physician.    Time 6    Period Weeks    Status Achieved   reveiwed graduated driving recommendations with pt and instructed her that she must have MD clearance to go back to driving 25/852/77     OT LONG TERM GOAL #4   Title Pt will improve R grip strength by at least 10lbs to assist with opening containers.    Baseline 36.5lbs    Time 6    Period Weeks    Status Achieved   46.2 lbs                Plan - 10/14/20 0924    Clinical Impression Statement Pt demonstrates good overal progress. Pt is only scheduled for 1 more visit, pt reports she feels ready for d/c at that time.    OT Occupational Profile and History Detailed Assessment- Review of Records and additional review of physical, cognitive, psychosocial history related to current functional performance    Occupational performance deficits (Please refer to evaluation for details): ADL's;IADL's;Leisure;Work    Games developer / Function / Physical Skills ADL;Balance;IADL;Strength;UE functional use;Decreased knowledge of precautions    Cognitive Skills Attention;Memory    Rehab Potential Good    Clinical Decision Making Several treatment options, min-mod task modification necessary    Comorbidities Affecting Occupational Performance: May have comorbidities impacting occupational performance    Modification or Assistance to Complete Evaluation  No modification of tasks or assist necessary to complete eval    OT Frequency 2x / week    OT  Duration 6 weeks   +eval   OT Treatment/Interventions Self-care/ADL training;DME and/or AE instruction;Moist Heat;Balance training;Therapeutic activities;Therapeutic exercise;Cognitive remediation/compensation;Cryotherapy;Neuromuscular education;Functional Mobility Training;Passive range of motion;Patient/family education;Visual/perceptual remediation/compensation    Plan Check goals, anticipate d/c next visit, review red theraband HEP.  Consulted and Agree with Plan of Care Patient           Patient will benefit from skilled therapeutic intervention in order to improve the following deficits and impairments:   Body Structure / Function / Physical Skills: ADL, Balance, IADL, Strength, UE functional use, Decreased knowledge of precautions Cognitive Skills: Attention, Memory     Visit Diagnosis: Hemiplegia and hemiparesis following cerebral infarction affecting right dominant side (HCC)  Muscle weakness (generalized)  Other symptoms and signs involving cognitive functions following cerebral infarction  Unsteadiness on feet    Problem List Patient Active Problem List   Diagnosis Date Noted  . SAH (subarachnoid hemorrhage) (HCC) - L frontal parafalcine, present on admission w/ infarcts 07/20/2020  . 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 Essential hypertension 07/20/2020  . Hyperlipidemia 07/20/2020  . Urinary retention 07/20/2020  . Obesity 07/20/2020  . Hypokalemia 07/20/2020  . Hyperglycemia   . PFO (patent foramen ovale) 07/19/2020  . Acute L ACA, R cerebellar, L temporoparietal infarcts (HCC)  07/17/2020  . Neurologic abnormality   . Stroke (cerebrum) (HCC) 07/16/2020    Kaevon Cotta 10/14/2020, 9:31 AM  [facility] [address] [place], [place], [address] Phone: [phone]   Fax:  [phone]  Name: KARMON ANDIS MRN: [id number] Date of Birth: 07-09-1971

## 2020-10-17 ENCOUNTER — Ambulatory Visit: Payer: BC Managed Care – PPO | Admitting: Occupational Therapy

## 2020-10-19 ENCOUNTER — Encounter: Payer: BC Managed Care – PPO | Attending: Registered Nurse | Admitting: Physical Medicine & Rehabilitation

## 2020-10-19 ENCOUNTER — Encounter: Payer: Self-pay | Admitting: Physical Medicine & Rehabilitation

## 2020-10-19 ENCOUNTER — Other Ambulatory Visit: Payer: Self-pay

## 2020-10-19 VITALS — BP 131/78 | HR 77 | Temp 98.2°F | Ht 62.5 in | Wt 186.0 lb

## 2020-10-19 DIAGNOSIS — I609 Nontraumatic subarachnoid hemorrhage, unspecified: Secondary | ICD-10-CM | POA: Diagnosis present

## 2020-10-19 DIAGNOSIS — I10 Essential (primary) hypertension: Secondary | ICD-10-CM | POA: Diagnosis present

## 2020-10-19 NOTE — Patient Instructions (Signed)
KEEP DRIVING LOCAL AND DAYTIME FOR NOW. (3 MOS)

## 2020-10-19 NOTE — Progress Notes (Signed)
Subjective:    Patient ID: Tanya Lopez, female    DOB: 01-06-1971, 49 y.o.   MRN: [id number]  HPI   [name] is here in f/u of her bi-cerebral infarcts. She has been involved with outpt neuro-rehab and is making nice progress. They are addressing language, balance, coordination, strength and mobility. Her last visit is tomorrow with occupational therapy.  She is able to perform basic tasks around the house.  She is independent of all of her self-care and mobility.  She denies any falls or near misses.  Her language continues to improve.  Her husband has driven with her on a few occasions and she has done extremely well with driving.  She asked if she can have permission to drive on her own.  I asked her what she intended to do with her driving and she stated that she wanted to go to the store and do things with her kids here locally in town.  Her sleep has been regular.  Bowel bladder function are normal.  Mood is very positive.     Pain Inventory Average Pain 1 Pain Right Now 1 My pain is ?  LOCATION OF PAIN  shoulder  BOWEL Number of stools per week: 7 Oral laxative use No  Type of laxative  Enema or suppository use No  History of colostomy No  Incontinent No   BLADDER Normal In and out cath, frequency  Able to self cath No  Bladder incontinence No  Frequent urination No  Leakage with coughing No  Difficulty starting stream No  Incomplete bladder emptying No    Mobility walk without assistance Do you have any goals in this area?  no  Function Do you have any goals in this area?  no  Neuro/Psych No problems in this area  Prior Studies Any changes since last visit?  no  Physicians involved in your care Any changes since last visit?  no   Family History  Problem Relation Age of Onset  . Stroke Mother   . High blood pressure Mother   . High blood pressure Father    Social History   Socioeconomic History  . Marital status: Married     Spouse name: Not on file  . Number of children: Not on file  . Years of education: Not on file  . Highest education level: Not on file  Occupational History  . Not on file  Tobacco Use  . Smoking status: Never Smoker  . Smokeless tobacco: Never Used  Vaping Use  . Vaping Use: Never used  Substance and Sexual Activity  . Alcohol use: Never  . Drug use: Never  . Sexual activity: Not on file  Other Topics Concern  . Not on file  Social History Narrative  . Not on file   Social Determinants of Health   Financial Resource Strain:   . Difficulty of Paying Living Expenses: Not on file  Food Insecurity:   . Worried About Programme researcher, broadcasting/film/video in the Last Year: Not on file  . Ran Out of Food in the Last Year: Not on file  Transportation Needs:   . Lack of Transportation (Medical): Not on file  . Lack of Transportation (Non-Medical): Not on file  Physical Activity:   . Days of Exercise per Week: Not on file  . Minutes of Exercise per Session: Not on file  Stress:   . Feeling of Stress : Not on file  Social Connections:   . Frequency of  Communication with Friends and Family: Not on file  . Frequency of Social Gatherings with Friends and Family: Not on file  . Attends Religious Services: Not on file  . Active Member of Clubs or Organizations: Not on file  . Attends Banker Meetings: Not on file  . Marital Status: Not on file   Past Surgical History:  Procedure Laterality Date  . IR ANGIO INTRA EXTRACRAN SEL COM CAROTID INNOMINATE BILAT MOD SED  07/17/2020  . IR ANGIO VERTEBRAL SEL VERTEBRAL BILAT MOD SED  07/17/2020  . IR CT HEAD LTD  07/17/2020  . RADIOLOGY WITH ANESTHESIA N/A 07/16/2020   Procedure: IR WITH ANESTHESIA;  Surgeon: Julieanne Cotton, MD;  Location: [facility] OR;  Service: Radiology;  Laterality: N/A;  . TEE WITHOUT CARDIOVERSION N/A 07/19/2020   Procedure: TRANSESOPHAGEAL ECHOCARDIOGRAM (TEE);  Surgeon: Chilton Si, MD;  Location: [facility] ENDOSCOPY;  Service:  Cardiovascular;  Laterality: N/A;   Past Medical History:  Diagnosis Date  . Hypertension   . PFO (patent foramen ovale) 07/19/2020   Noted on TEE 07/19/2020   BP 131/78   Pulse 77   Temp 98.2 F (36.8 C)   Ht 5' 2.5" (1.588 m)   Wt 186 lb (84.4 kg)   SpO2 98%   BMI 33.48 kg/m   Opioid Risk Score:   Fall Risk Score:  `1  Depression screen PHQ 2/9  Depression screen PHQ 2/9 08/25/2020  Decreased Interest 0  Down, Depressed, Hopeless 0  PHQ - 2 Score 0  Altered sleeping 0  Tired, decreased energy 0  Change in appetite 0  Feeling bad or failure about yourself  0  Trouble concentrating 0  Moving slowly or fidgety/restless 0  Suicidal thoughts 0  PHQ-9 Score 0    Review of Systems  Constitutional: Negative.   HENT: Negative.   Eyes: Negative.   Respiratory: Negative.   Cardiovascular: Negative.   Gastrointestinal: Negative.   Endocrine: Negative.   Genitourinary: Negative.   Musculoskeletal: Negative.   Skin: Negative.   Allergic/Immunologic: Negative.   Neurological: Negative.   Hematological: Negative.   Psychiatric/Behavioral: Negative.   All other systems reviewed and are negative.      Objective:   Physical Exam  Gen: no distress, normal appearing HEENT: oral mucosa pink and moist, NCAT Cardio: Reg rate Chest: normal effort, normal rate of breathing Abd: soft, non-distended Ext: no edema Skin: intact Neuro: Mild right central seventh.  Patient still a bit delayed with language but it is very fluent and overall intact.  Cognitively she demonstrates significant improvement.  She remembered 3 of 3 words after 5 minutes.  She was able to sequence numbers and letters without difficulty.  Is able to perform some basic algebra with some extra time.  She was aware of current events but there was some delay in describing these.  Remains slightly weak in the right upper extremity which is 4 out of 5 and similarly 4 out of 5 in the right lower extremity.  Left upper  and lower extremity are 5 out of 5.  No focal sensory findings are noted.  She ambulated for me today without loss of balance even with heel-to-toe gait. Musculoskeletal: Normal range of motion and no pain in any tested joints. Psych: pleasant, normal affect        Assessment & Plan:  . 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              -  Patient has made extraordinary gains.  She wraps up therapy this week.  -I gave her permission to drive locally and during the daytime as her husband is a physician and has gone out with her already.  Patient has demonstrated safe driving habits. 2.  Antithrombotics:            -antiplatelet therapy: On ASA, Plavix started for 6 months.  3. Pain Management: No pain at present.  Tylenol as needed 4. Mood: Patient is in a good place emotionally.  Husband provides support. 5 HTN: Norvasc, Bystolic.    Blood pressure well controlled at present   Fifteen minutes of face to face patient care time were spent during this visit. All questions were encouraged and answered.  Follow up with me prn .

## 2020-10-20 ENCOUNTER — Encounter: Payer: Self-pay | Admitting: Occupational Therapy

## 2020-10-20 ENCOUNTER — Ambulatory Visit: Payer: BC Managed Care – PPO | Admitting: Occupational Therapy

## 2020-10-20 DIAGNOSIS — R4701 Aphasia: Secondary | ICD-10-CM | POA: Diagnosis not present

## 2020-10-20 DIAGNOSIS — R41840 Attention and concentration deficit: Secondary | ICD-10-CM

## 2020-10-20 DIAGNOSIS — R2681 Unsteadiness on feet: Secondary | ICD-10-CM

## 2020-10-20 DIAGNOSIS — I69351 Hemiplegia and hemiparesis following cerebral infarction affecting right dominant side: Secondary | ICD-10-CM

## 2020-10-20 DIAGNOSIS — I69318 Other symptoms and signs involving cognitive functions following cerebral infarction: Secondary | ICD-10-CM

## 2020-10-20 NOTE — Therapy (Signed)
[facility] [address] [place], [place], [address] Phone: [phone]   Fax:  [phone]  Occupational Therapy Treatment  Patient Details  Name: Tanya Lopez MRN: [id number] Date of Birth: January 22, 1971 Referring Provider (OT): Dr. Oval Linsey   Encounter Date: 10/20/2020   OT End of Session - 10/20/20 1017    Visit Number 7    Number of Visits 13    Date for OT Re-Evaluation 10/27/20    Authorization Type [employer] 2021, 30 visit limit combined OT/PT (each visit of each type counts), no auth    Authorization - Visit Number 7    Authorization - Number of Visits 15    OT Start Time 1020    OT Stop Time 1100    OT Time Calculation (min) 40 min    Activity Tolerance Patient tolerated treatment well    Behavior During Therapy [facility] for tasks assessed/performed;Flat affect           Past Medical History:  Diagnosis Date  . Hypertension   . PFO (patent foramen ovale) 07/19/2020   Noted on TEE 07/19/2020    Past Surgical History:  Procedure Laterality Date  . IR ANGIO INTRA EXTRACRAN SEL COM CAROTID INNOMINATE BILAT MOD SED  07/17/2020  . IR ANGIO VERTEBRAL SEL VERTEBRAL BILAT MOD SED  07/17/2020  . IR CT HEAD LTD  07/17/2020  . RADIOLOGY WITH ANESTHESIA N/A 07/16/2020   Procedure: IR WITH ANESTHESIA;  Surgeon: Luanne Bras, MD;  Location: [facility];  Service: Radiology;  Laterality: N/A;  . TEE WITHOUT CARDIOVERSION N/A 07/19/2020   Procedure: TRANSESOPHAGEAL ECHOCARDIOGRAM (TEE);  Surgeon: Skeet Latch, MD;  Location: [facility];  Service: Cardiovascular;  Laterality: N/A;    There were no vitals filed for this visit.   Subjective Assessment - 10/20/20 1017    Subjective  Pt reports that Dr. Naaman Plummer cleared her to drive    Patient is accompanied by: Family member   husband   Pertinent History Cerebral Infarction 07/16/20.  PMH:  Hypertension, Patent Foramen Ovale, HLD    Patient Stated Goals be able to  drive, improve RUE strength    Currently in Pain? No/denies               Divided Attention Tasks:  Ambulating with tossing ball from on hand to the other with visual scanning with approx 80% accuracy, located remaining without cueing on 2nd pass.    Ambulating with tossing 1 scarf in each hand alternating while performing category generation with min difficulty/pauses.  Standing multi-directional step and reach bilaterally with good accuracy/no difficulty and then with cognitive component for divided attention with good accuracy.  Sitting, performing functional reaching to place small pegs in vertical pegboard to copy design while counting backwards by 3's with only occasional min difficulty, copied design with 100% accuracy.     Checked remaining goals and discussed progress.  Pt reports that she feels like she is ready for d/c at this time and has returned to all prior tasks.     OT Education - 10/20/20 1031    Education Details Reviewed red theraband HEP and pt returned demo each x15.  Discussed ways to progress HEP    Person(s) Educated Patient    Methods Explanation;Demonstration    Comprehension Returned demonstration;Verbalized understanding            OT Short Term Goals - 10/20/20 1030      OT SHORT TERM GOAL #1   Title Pt will  be independent with HEP for RUE hand strength--check STGs 10/12/20    Time 4    Period Weeks    Status Achieved   issued putty 09/15/20     OT SHORT TERM GOAL #2   Title Pt will perform environmental scanning in busy, dynamic environment with 100% accuracy for incr safety with driving.    Time 4    Period Weeks    Status Achieved   15/15     OT SHORT TERM GOAL #3   Title Pt will be verbalize understanding of memory/cognitive compensation strategies for ADLs/IADLs prn.    Time 4    Period Weeks    Status Achieved   memory strategies reveiwed and issued 10/14/20     OT SHORT TERM GOAL #4   Title Pt will perform mod complex  cooking including chopping/cutting mod I.    Time 8    Period Weeks    Status Achieved   Pt reports doing some chopping and cooking at home with distant supervision     OT SHORT TERM GOAL #5   Title Pt will perfom mod complex cleaning including reaching high and low mod I.    Time 8    Period Weeks    Status Achieved   reached overhead with putting away cones no difficulty 09/15/20            OT Long Term Goals - 10/20/20 1029      OT LONG TERM GOAL #1   Title Pt will be independent with HEP for proximal RUE strength.--check LTGs 10/27/20    Time 6    Period Weeks    Status Achieved   issued red theraband exercises     OT LONG TERM GOAL #2   Title Pt will be able to divide attention between at least 1 cognitive and 1 physical task with at least 95% accuracy in prep for driving.    Time 6    Period Weeks    Status Achieved   good performance today only occasional min v.c for naming animals while walking and tossing ball 10/14/20.  10/20/20:  met at approx this level for a variety of tasks (incr difficulty for some tasks)     OT LONG TERM GOAL #3   Title Pt/husband will verbalize understanding of graduated driving recommendations once cleared to drive by physician.    Time 6    Period Weeks    Status Achieved   reveiwed graduated driving recommendations with pt and instructed her that she must have MD clearance to go back to driving 35/329/92.  42/68/34 received clearance and has resumed driving without problem     OT LONG TERM GOAL #4   Title Pt will improve R grip strength by at least 10lbs to assist with opening containers.    Baseline 36.5lbs    Time 6    Period Weeks    Status Achieved   46.2 lbs                Plan - 10/20/20 1023    Clinical Impression Statement Pt has shown good progress and is appropriate for d/c.  She has resumed driving with physician clearance.  Pt met all goals.    OT Occupational Profile and History Detailed Assessment- Review of  Records and additional review of physical, cognitive, psychosocial history related to current functional performance    Occupational performance deficits (Please refer to evaluation for details): ADL's;IADL's;Leisure;Work    Marketing executive / Function /  Physical Skills ADL;Balance;IADL;Strength;UE functional use;Decreased knowledge of precautions    Cognitive Skills Attention;Memory    Rehab Potential Good    Clinical Decision Making Several treatment options, min-mod task modification necessary    Comorbidities Affecting Occupational Performance: May have comorbidities impacting occupational performance    Modification or Assistance to Complete Evaluation  No modification of tasks or assist necessary to complete eval    OT Frequency 2x / week    OT Duration 6 weeks   +eval   OT Treatment/Interventions Self-care/ADL training;DME and/or AE instruction;Moist Heat;Balance training;Therapeutic activities;Therapeutic exercise;Cognitive remediation/compensation;Cryotherapy;Neuromuscular education;Functional Mobility Training;Passive range of motion;Patient/family education;Visual/perceptual remediation/compensation    Plan d/c OT    Consulted and Agree with Plan of Care Patient           Patient will benefit from skilled therapeutic intervention in order to improve the following deficits and impairments:   Body Structure / Function / Physical Skills: ADL, Balance, IADL, Strength, UE functional use, Decreased knowledge of precautions Cognitive Skills: Attention, Memory     Visit Diagnosis: Hemiplegia and hemiparesis following cerebral infarction affecting right dominant side (HCC)  Other symptoms and signs involving cognitive functions following cerebral infarction  Unsteadiness on feet  Attention and concentration deficit    Problem List Patient Active Problem List   Diagnosis Date Noted  . SAH (subarachnoid hemorrhage) (HCC) - L frontal parafalcine, present on admission w/ infarcts  07/20/2020  . 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 Essential hypertension 07/20/2020  . Hyperlipidemia 07/20/2020  . Urinary retention 07/20/2020  . Obesity 07/20/2020  . Hypokalemia 07/20/2020  . Hyperglycemia   . PFO (patent foramen ovale) 07/19/2020  . Acute L ACA, R cerebellar, L temporoparietal infarcts ([place])  07/17/2020  . Neurologic abnormality   . Stroke (cerebrum) ([place]) 07/16/2020    OCCUPATIONAL THERAPY DISCHARGE SUMMARY  Visits from Start of Care: 7  Current functional level related to goals / functional outcomes: See above    Remaining deficits: Mild decr RUE strength--improved, pt will continue with HEP Mild possible decr divided attention--pt denies difficulty with memory or divided attention and has returned to driving (with physician clearance).   Education / Equipment: Pt instructed in HEP, graduated driving (after cleared by physician), memory compensation strategies.  Plan: Patient agrees to discharge.  Patient goals were met. Patient is being discharged due to meeting the stated rehab goals.  And pt pleased with current functional level/progress. ?????       [facility] 10/20/2020, 11:20 AM  [facility] [address] [place], [place], [address] Phone: [phone]   Fax:  [phone]  Name: SKYLAR FLYNT MRN: [id number] Date of Birth: 04-Apr-1971   Vianne Bulls, OTR/L [facility] [address] [place], [place]  [address] [phone] phone [phone] 10/20/20 11:20 AM

## 2020-12-27 ENCOUNTER — Telehealth: Payer: Self-pay | Admitting: Cardiovascular Disease

## 2020-12-27 NOTE — Telephone Encounter (Signed)
Patient calling to reschedule her appointment with Dr. Excell Seltzer. She states she would like to push her appointment out until the end of January. I did not see anything available on Dr. Earmon Phoenix schedule. She would like to know if she can be worked in.

## 2020-12-27 NOTE — Telephone Encounter (Signed)
Left message offering 1/24 OV. Informed the patient a MyChart message will be sent to confirm.

## 2021-01-02 ENCOUNTER — Ambulatory Visit: Payer: BC Managed Care – PPO | Admitting: Cardiovascular Disease

## 2021-01-16 ENCOUNTER — Ambulatory Visit: Payer: BC Managed Care – PPO | Admitting: Cardiovascular Disease

## 2021-05-24 IMAGING — CR DG HIP (WITH OR WITHOUT PELVIS) 2-3V*R*
3 series · 3 of 3 positions shown · non-contrast
Comparison: None.

CLINICAL DATA: 49-year-old female with fall.

EXAM:
DG HIP (WITH OR WITHOUT PELVIS) 2-3V RIGHT

[pelvis ap]
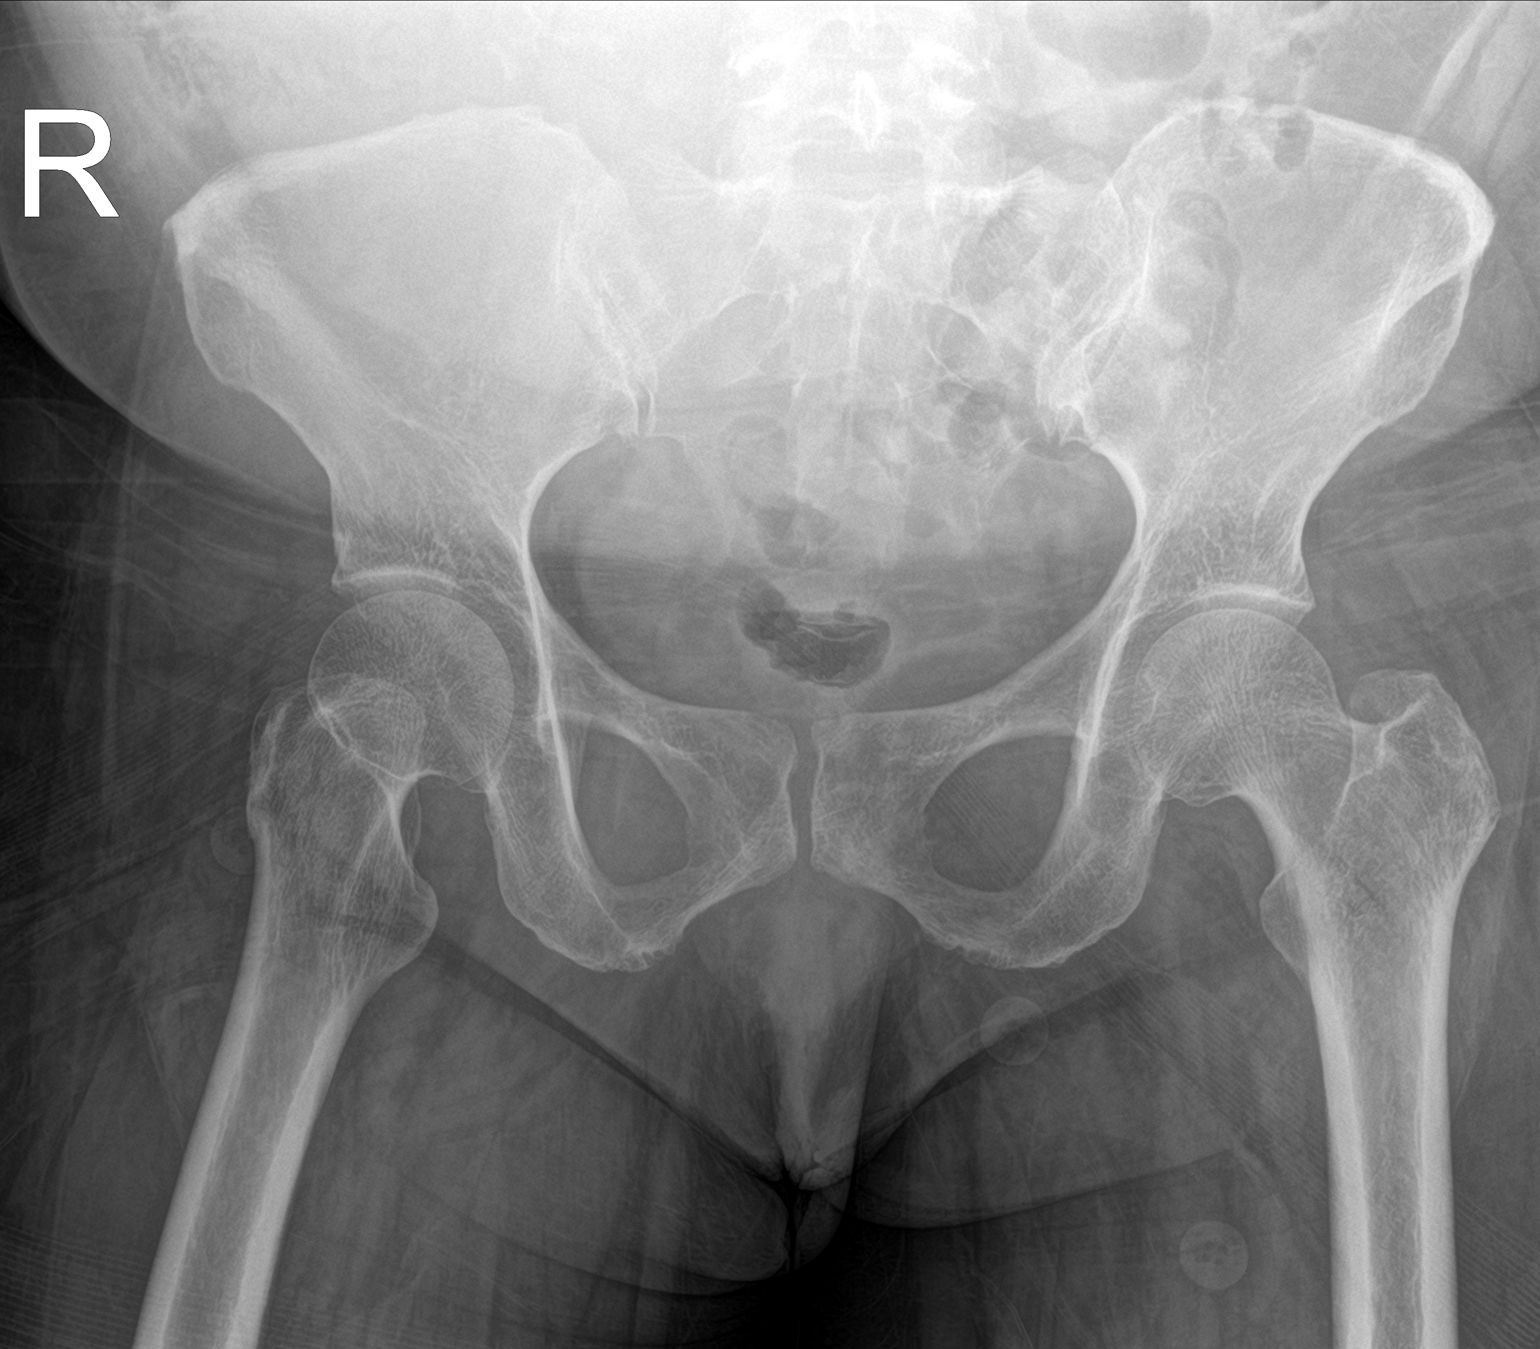

[hip ap]
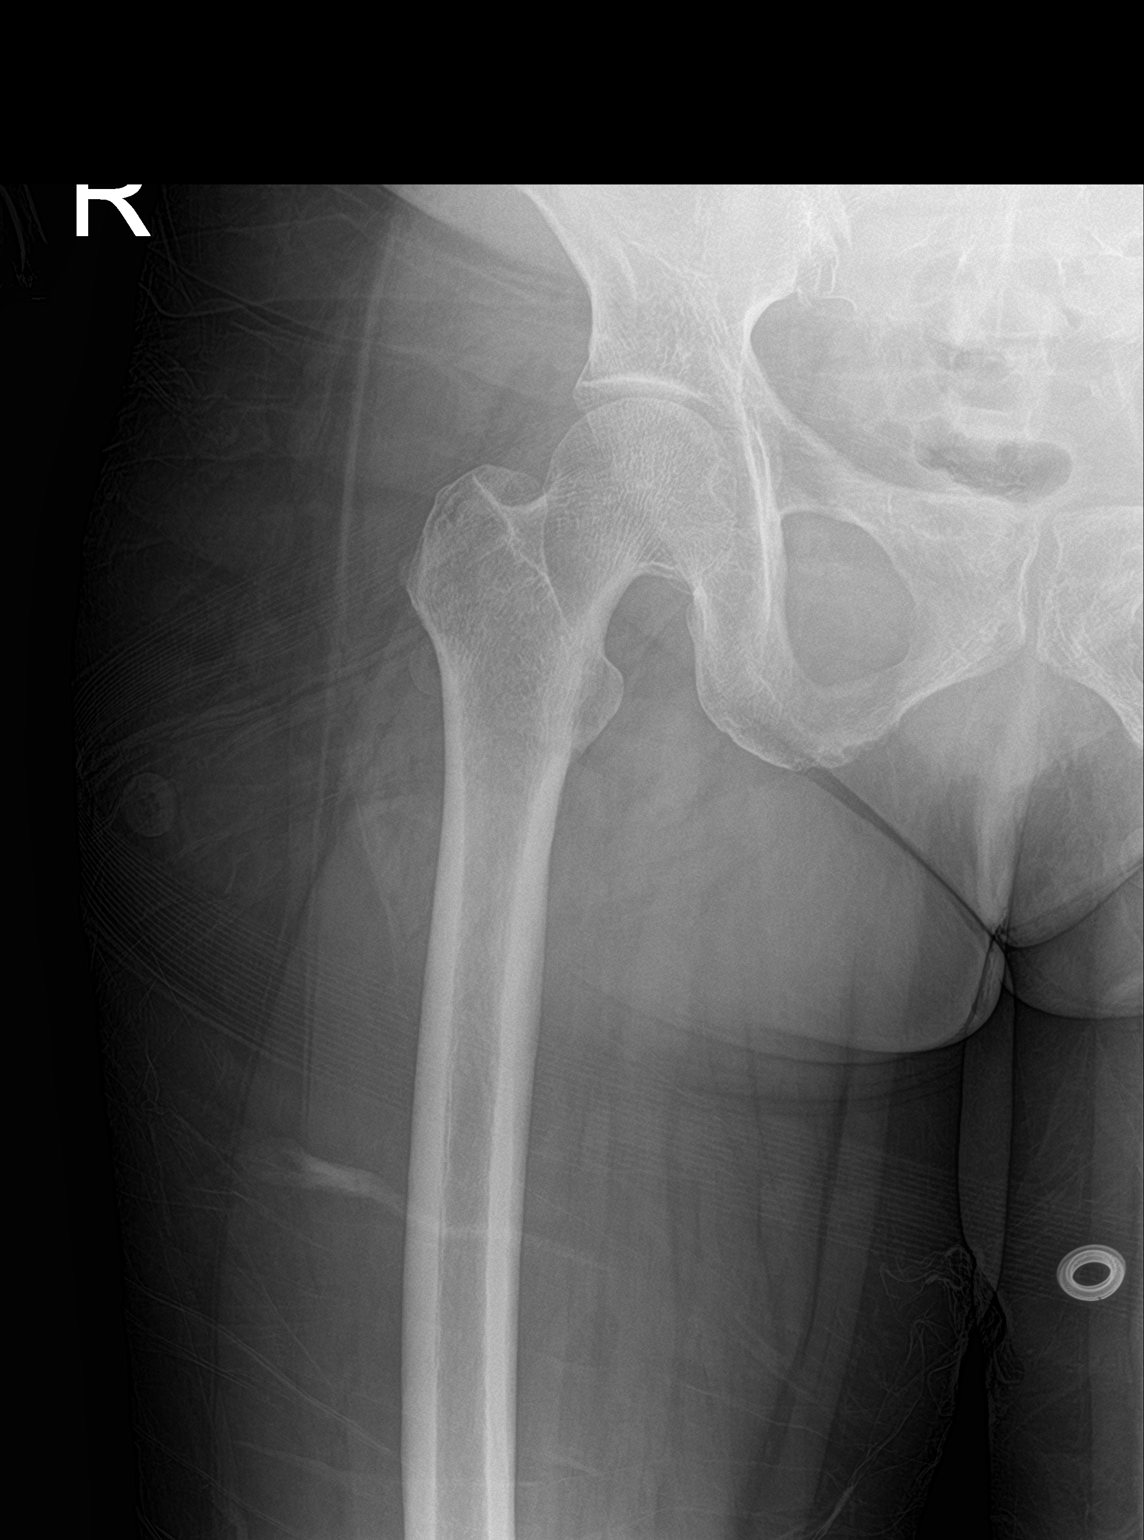

[hip lat]
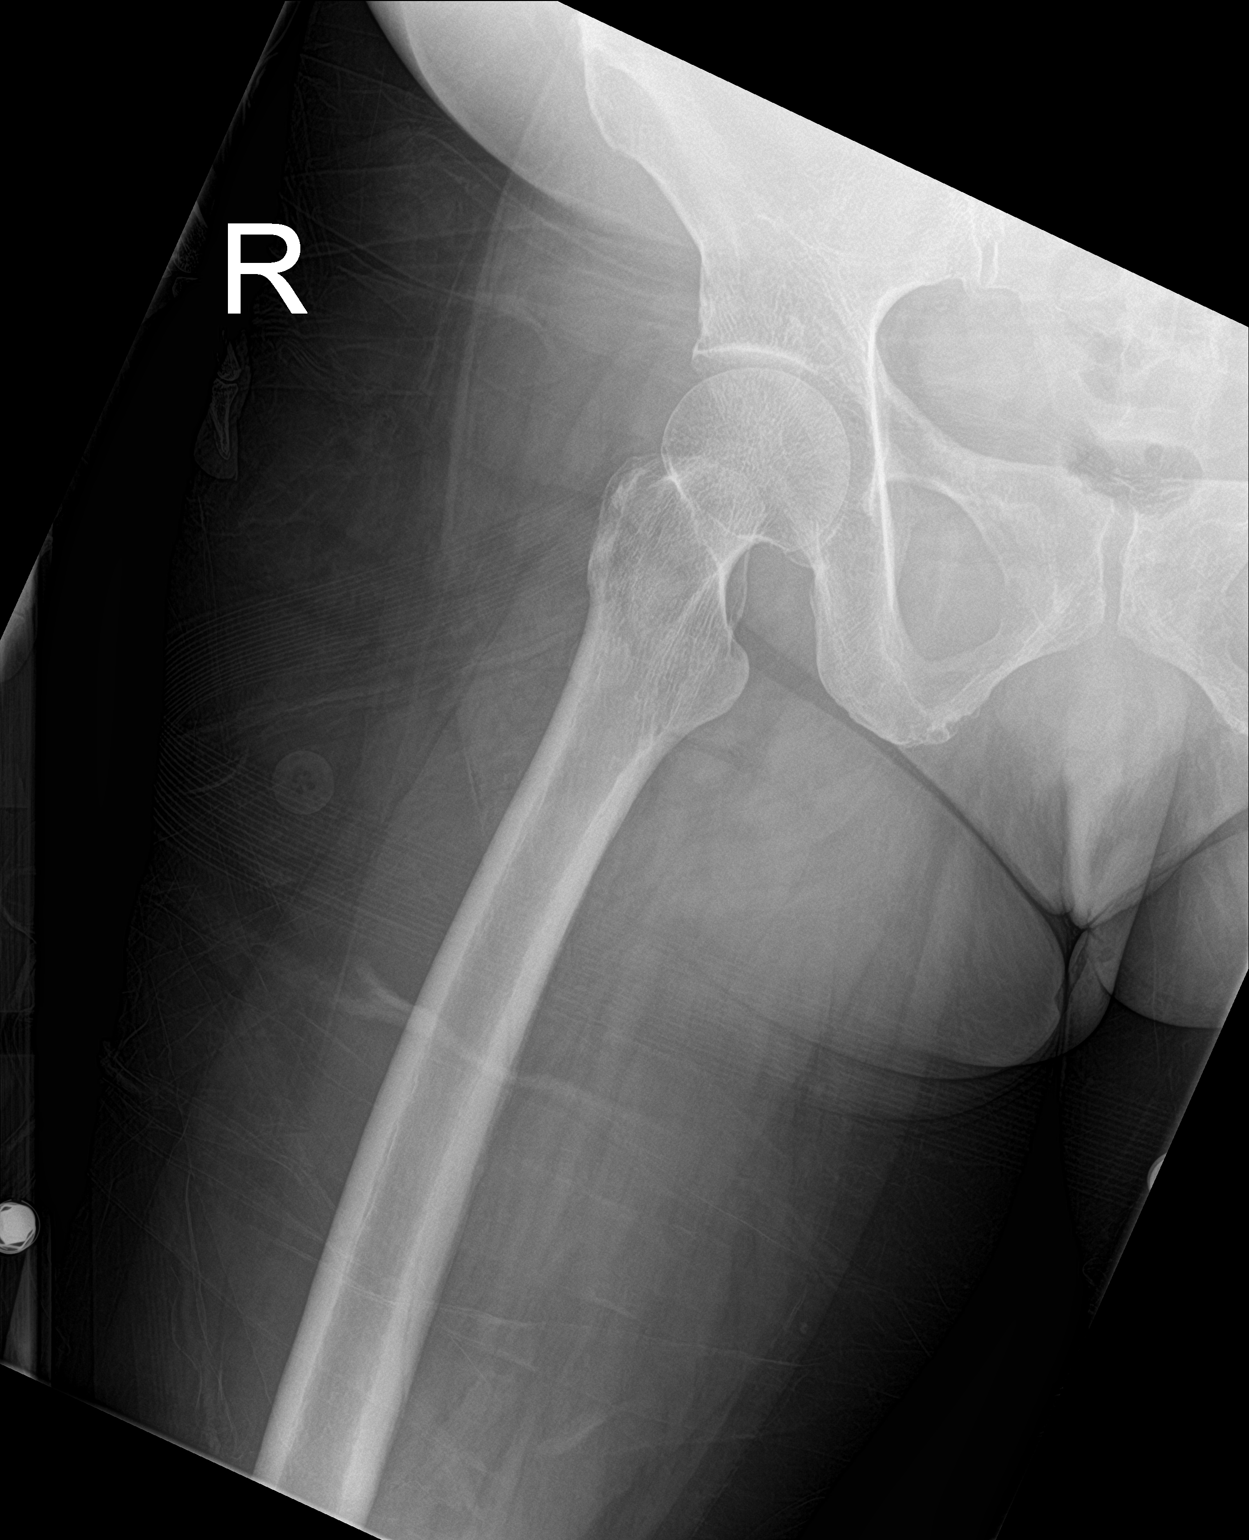

[3 of 3 positions shown; findings below may reference images not displayed]

FINDINGS: There is no evidence of hip fracture or dislocation. There is no
evidence of arthropathy or other focal bone abnormality.
IMPRESSION: Negative.

## 2021-10-11 LAB — COLOGUARD: COLOGUARD: NEGATIVE

## 2024-06-19 ENCOUNTER — Encounter (HOSPITAL_COMMUNITY): Payer: Self-pay | Admitting: Interventional Radiology
